# Patient Record
Sex: Female | Born: 1960 | Race: White | Hispanic: No | Marital: Married | Smoking: Former smoker
Health system: Southern US, Community
[De-identification: ages and names within clinical notes are randomized; demographics above are authoritative.]

## PROBLEM LIST (undated history)

## (undated) DIAGNOSIS — E785 Hyperlipidemia, unspecified: Secondary | ICD-10-CM

## (undated) DIAGNOSIS — Q211 Atrial septal defect, unspecified: Secondary | ICD-10-CM

## (undated) DIAGNOSIS — R109 Unspecified abdominal pain: Secondary | ICD-10-CM

## (undated) DIAGNOSIS — I10 Essential (primary) hypertension: Secondary | ICD-10-CM

## (undated) DIAGNOSIS — E559 Vitamin D deficiency, unspecified: Secondary | ICD-10-CM

## (undated) DIAGNOSIS — L659 Nonscarring hair loss, unspecified: Secondary | ICD-10-CM

## (undated) DIAGNOSIS — R0600 Dyspnea, unspecified: Secondary | ICD-10-CM

## (undated) DIAGNOSIS — C4491 Basal cell carcinoma of skin, unspecified: Secondary | ICD-10-CM

## (undated) DIAGNOSIS — N809 Endometriosis, unspecified: Secondary | ICD-10-CM

## (undated) DIAGNOSIS — K589 Irritable bowel syndrome without diarrhea: Secondary | ICD-10-CM

## (undated) DIAGNOSIS — R030 Elevated blood-pressure reading, without diagnosis of hypertension: Secondary | ICD-10-CM

## (undated) DIAGNOSIS — T7840XA Allergy, unspecified, initial encounter: Secondary | ICD-10-CM

## (undated) HISTORY — PX: SKIN CANCER EXCISION: SHX779

## (undated) HISTORY — DX: Endometriosis, unspecified: N80.9

## (undated) HISTORY — DX: Allergy, unspecified, initial encounter: T78.40XA

## (undated) HISTORY — DX: Atrial septal defect: Q21.1

## (undated) HISTORY — DX: Basal cell carcinoma of skin, unspecified: C44.91

## (undated) HISTORY — DX: Nonscarring hair loss, unspecified: L65.9

## (undated) HISTORY — DX: Atrial septal defect, unspecified: Q21.10

## (undated) HISTORY — DX: Vitamin D deficiency, unspecified: E55.9

## (undated) HISTORY — DX: Unspecified abdominal pain: R10.9

## (undated) HISTORY — DX: Dyspnea, unspecified: R06.00

## (undated) HISTORY — DX: Hyperlipidemia, unspecified: E78.5

## (undated) HISTORY — DX: Irritable bowel syndrome, unspecified: K58.9

---
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
## 1988-05-19 HISTORY — PX: LAPAROTOMY: SHX154

## 1988-05-19 HISTORY — PX: CHOLECYSTECTOMY: SHX55

## 1998-05-19 HISTORY — PX: OOPHORECTOMY: SHX86

## 2011-03-26 ENCOUNTER — Other Ambulatory Visit: Payer: Self-pay | Admitting: *Deleted

## 2011-03-26 DIAGNOSIS — R2230 Localized swelling, mass and lump, unspecified upper limb: Secondary | ICD-10-CM

## 2011-04-08 ENCOUNTER — Ambulatory Visit
Admission: RE | Admit: 2011-04-08 | Discharge: 2011-04-08 | Disposition: A | Discharge status: Home / Self Care | Payer: PRIVATE HEALTH INSURANCE | Source: Ambulatory Visit | Attending: *Deleted | Admitting: *Deleted

## 2011-04-08 DIAGNOSIS — R2230 Localized swelling, mass and lump, unspecified upper limb: Secondary | ICD-10-CM

## 2011-11-25 ENCOUNTER — Other Ambulatory Visit: Payer: Self-pay | Admitting: Family Medicine

## 2011-11-25 DIAGNOSIS — R2230 Localized swelling, mass and lump, unspecified upper limb: Secondary | ICD-10-CM

## 2012-02-23 ENCOUNTER — Ambulatory Visit (INDEPENDENT_AMBULATORY_CARE_PROVIDER_SITE_OTHER)
Admission: RE | Admit: 2012-02-23 | Discharge: 2012-02-23 | Disposition: A | Discharge status: Home / Self Care | Payer: PRIVATE HEALTH INSURANCE | Source: Ambulatory Visit | Attending: Internal Medicine | Admitting: Internal Medicine

## 2012-02-23 ENCOUNTER — Ambulatory Visit (INDEPENDENT_AMBULATORY_CARE_PROVIDER_SITE_OTHER): Payer: PRIVATE HEALTH INSURANCE | Admitting: Internal Medicine

## 2012-02-23 ENCOUNTER — Encounter: Payer: Self-pay | Admitting: Internal Medicine

## 2012-02-23 VITALS — BP 112/70 | HR 78 | Temp 98.0°F | Ht 67.0 in | Wt 159.8 lb

## 2012-02-23 DIAGNOSIS — R0609 Other forms of dyspnea: Secondary | ICD-10-CM

## 2012-02-23 DIAGNOSIS — R0600 Dyspnea, unspecified: Secondary | ICD-10-CM | POA: Insufficient documentation

## 2012-02-23 DIAGNOSIS — R0989 Other specified symptoms and signs involving the circulatory and respiratory systems: Secondary | ICD-10-CM

## 2012-02-23 HISTORY — DX: Other forms of dyspnea: R06.09

## 2012-02-23 NOTE — Patient Instructions (Addendum)
Classic subdiaphragmatic pain pattern suggests ibs: very limited distribution of pain locations, daytime, not exacerbated by ex or coughing, worse in sitting position, associated with generalized abd bloating, not present supine due to the dome effect of the diaphragm is  canceled in that position. Frequently these patients have had multiple negative GI workups and CT scans.  Treatment consists of avoiding foods that cause gas (especially beans and raw vegetables like spinach and salads boiled eggs)  and citrucel powder 1 heaping tsp twice daily with a large glass of water.  Pain should improve w/in 2 weeks and if not then consider further GI work up.

## 2012-02-23 NOTE — Progress Notes (Signed)
  Subjective:    Patient ID: Kristin [name], female    DOB: 22-Jul-1960  MRN: [id number]  HPI  51 yo  [place] wf  quit smoking 2006 ?  pna as child but intact  ex tol referred   by UC @ [place] for new cp   02/23/2012 1st pulmonary eval cc 12/2011 acute onset sore throat / hoarseness rx lozenges then went uc got prednisone and abx for dx of bronchitis then onset abd  And flank pain = bilaterally, sitting position only,  p took abx > 2nd abx/prednisone > better but not completely and still sense can't take deep breath, but ok lying down, ok push grand-daughter up and down driveway. Pos exposure to bleaches but minimal cough hx.  No daytime variability, always in sitting position, denies change in bowel habits/ diet/assoc nausea or excess gas or change in pain with flatus or eructation  Sleeping ok without nocturnal  or early am exacerbation  of respiratory  c/o's or need for noct saba. Also denies any obvious fluctuation of symptoms with weather or environmental changes or other aggravating or alleviating factors except as outlined above   Review of Systems  Constitutional: Negative for fever, chills and unexpected weight change.  HENT: Positive for congestion and sneezing. Negative for ear pain, nosebleeds, sore throat, rhinorrhea, trouble swallowing, dental problem, voice change, postnasal drip and sinus pressure.   Eyes: Negative for visual disturbance.  Respiratory: Negative for cough, choking and shortness of breath.   Cardiovascular: Positive for chest pain. Negative for leg swelling.  Gastrointestinal: Positive for abdominal pain. Negative for vomiting and diarrhea.  Genitourinary: Negative for difficulty urinating.  Musculoskeletal: Negative for arthralgias.  Skin: Negative for rash.  Neurological: Negative for tremors, syncope and headaches.  Hematological: Does not bruise/bleed easily.       Objective:   Physical Exam  Animated pleasant amb wf nad Wt 159 02/23/2012 HEENT: nl  dentition, turbinates, and orophanx. Nl external ear canals without cough reflex   NECK :  without JVD/Nodes/TM/ nl carotid upstrokes bilaterally   LUNGS: no acc muscle use, clear to A and P bilaterally without cough on insp or exp maneuvers   CV:  RRR  no s3 or murmur or increase in P2, no edema   ABD:  soft and diffusely tender across upper abd with viz scars over GB with nl excursion in the supine position. No bruits or organomegaly, bowel sounds nl  MS:  warm without deformities, calf tenderness, cyanosis or clubbing  SKIN: warm and dry without lesions    NEURO:  alert, approp, no deficits   CXR  02/23/2012 :  1. Mildly prominent right hilum, probably vascular although adenopathy is difficult to exclude. Assessment is mildly complicated by the leftward rotation on today's exam. 2. Mild left ventricular prominence on the lateral projection, but without overall cardiomegaly observed. 3. Subsegmental atelectasis in the lingula.       Assessment & Plan:

## 2012-02-24 NOTE — Assessment & Plan Note (Addendum)
-   02/23/2012  Walked RA x 3 laps @ 185 ft each stopped due to  End of study, no tachypnea, tachycardia or desat  Symptoms are markedly disproportionate to objective findings and not clear this is a lung problem but could well be due to a form of ibs with sym abd pain in sitting position only, no pain with cough, not present sleeping and no symptoms with exertion  See instructions for specific recommendations which were reviewed directly with the patient who was given a copy with highlighter outlining the key components. Will try rx with citrucel first then ct chest/abd next step if not improving in 2 weeks

## 2012-04-01 ENCOUNTER — Telehealth: Payer: Self-pay | Admitting: Internal Medicine

## 2012-04-01 DIAGNOSIS — R109 Unspecified abdominal pain: Secondary | ICD-10-CM

## 2012-04-01 NOTE — Telephone Encounter (Signed)
Spoke with pt's spouse He states that her abd pain is not improving with citrucel and wants referral to GI Are you okay with referring or should her PCP do this? Please advise thanks

## 2012-04-01 NOTE — Telephone Encounter (Signed)
Referral was made Spoke with pt's spouse and notified that this was done He verbalized understanding and states nothing further needed.

## 2012-04-01 NOTE — Telephone Encounter (Signed)
Sure - fine with me

## 2012-04-13 ENCOUNTER — Encounter: Payer: Self-pay | Admitting: *Deleted

## 2012-04-22 ENCOUNTER — Ambulatory Visit (INDEPENDENT_AMBULATORY_CARE_PROVIDER_SITE_OTHER): Payer: PRIVATE HEALTH INSURANCE | Admitting: Gastroenterology

## 2012-04-22 ENCOUNTER — Other Ambulatory Visit (INDEPENDENT_AMBULATORY_CARE_PROVIDER_SITE_OTHER): Payer: No Typology Code available for payment source

## 2012-04-22 ENCOUNTER — Encounter: Payer: Self-pay | Admitting: Gastroenterology

## 2012-04-22 VITALS — BP 130/80 | HR 58 | Ht 67.0 in | Wt 158.8 lb

## 2012-04-22 DIAGNOSIS — Z9089 Acquired absence of other organs: Secondary | ICD-10-CM

## 2012-04-22 DIAGNOSIS — R1010 Upper abdominal pain, unspecified: Secondary | ICD-10-CM

## 2012-04-22 DIAGNOSIS — Z9049 Acquired absence of other specified parts of digestive tract: Secondary | ICD-10-CM

## 2012-04-22 DIAGNOSIS — R109 Unspecified abdominal pain: Secondary | ICD-10-CM

## 2012-04-22 LAB — HEPATIC FUNCTION PANEL
ALT: 26 U/L (ref 0–35)
AST: 28 U/L (ref 0–37)
Albumin: 4.4 g/dL (ref 3.5–5.2)
Alkaline Phosphatase: 56 U/L (ref 39–117)
Bilirubin, Direct: 0.1 mg/dL (ref 0.0–0.3)
Total Bilirubin: 0.5 mg/dL (ref 0.3–1.2)
Total Protein: 7.7 g/dL (ref 6.0–8.3)

## 2012-04-22 LAB — FERRITIN: Ferritin: 40.7 ng/mL (ref 10.0–291.0)

## 2012-04-22 LAB — VITAMIN B12: Vitamin B-12: 616 pg/mL (ref 211–911)

## 2012-04-22 LAB — LIPASE: Lipase: 28 U/L (ref 11.0–59.0)

## 2012-04-22 LAB — AMYLASE: Amylase: 66 U/L (ref 27–131)

## 2012-04-22 LAB — SEDIMENTATION RATE: Sed Rate: 9 mm/hr (ref 0–22)

## 2012-04-22 LAB — IBC PANEL
Iron: 85 ug/dL (ref 42–145)
Saturation Ratios: 22.3 % (ref 20.0–50.0)
Transferrin: 272.3 mg/dL (ref 212.0–360.0)

## 2012-04-22 LAB — FOLATE: Folate: 22.2 ng/mL (ref >5.9)

## 2012-04-22 MED ORDER — CILIDINIUM-CHLORDIAZEPOXIDE 2.5-5 MG PO CAPS: 1.0000 | ORAL_CAPSULE | Freq: Three times a day (TID) | ORAL | Status: DC | PRN

## 2012-04-22 NOTE — Patient Instructions (Addendum)
You have been scheduled for an endoscopy with propofol. Please follow written instructions given to you at your visit today. If you use inhalers (even only as needed) or a CPAP machine, please bring them with you on the day of your procedure.  Your physician has requested that you go to the basement for lab work before leaving today.  You have been scheduled for an abdominal ultrasound at [facility] (1st floor of hospital) on 04-26-2012 at 9 AM. Please arrive 15 minutes prior to your appointment for registration. Make certain not to have anything to eat or drink 6 hours prior to your appointment. Should you need to reschedule your appointment, please contact radiology at [phone]. This test typically takes about 30 minutes to perform.  We have sent the following medications to your pharmacy for you to pick up at your convenience: Librax. Please take as directed.

## 2012-04-22 NOTE — Progress Notes (Addendum)
History of Present Illness:  This is a 51 year old Polish-American female patient of Magda Bernheim and Dr. Jeani Hawking in gastroenterology.She recently saw Dr. Sandrea Hughs in the pulmonary division, and had a negative pulmonary evaluation , and was referred to gastroenterology for evaluation.  This patient has had over a year of pain in her subcostal areas bilaterally with initial CT scan suggesting dilated biliary structures, but MRCP was unremarkable.  She saw Dr. Elnoria Howard and had a negative colonoscopy in January 2013.  We have no records of any followup from these physicians.  Her pain is in her subcostal areas bilaterally, is constant, is made best by lying still, his not related to eating or bowel movements, and there is no melena, hematochezia, nausea vomiting, dyspepsia, or any specific hepatobiliary complaints.  She's had no anorexia, weight loss, fever or chills.  She denies abuse of alcohol, cigarettes, or NSAIDs.  On review of her records, I cannot see any laboratory parameters.  Is not on any medications except for when necessary Tylenol and when necessary Advil.  Apparently in 1989 this patient had cholecystectomy with subsequent postoperative complications ,and had repeat extensive biliary and abdominal surgery.  This was performed in: [place] and thus no records are available for review.  She also has had previous cesarean sections.  She denies specific gynecologic problems.  There is no history of hepatitis, pancreatitis, or alcohol abuse.  I have reviewed this patient's present history, medical and surgical past history, allergies and medications.     ROS: The remainder of the 10 point ROS is negative     Physical Exam: Blood pressure 130/80, pulse 50 and regular, and weight under and 58 pounds with a BMI of 24.87. General well developed well nourished patient in no acute distress, appearing their stated age Eyes PERRLA, no icterus, fundoscopic exam per opthamologist Skin no  lesions noted Neck supple, no adenopathy, no thyroid enlargement, no tenderness,, mild thyromegaly noted. Chest clear to percussion and auscultation Heart no significant murmurs, gallops or rubs noted Abdomen no hepatosplenomegaly masses or tenderness, BS normal.  Rectal inspection normal no fissures, or fistulae noted.  No masses or tenderness on digital exam. Stool guaiac negative. Extremities no acute joint lesions, edema, phlebitis or evidence of cellulitis. Neurologic patient oriented x 3, cranial nerves intact, no focal neurologic deficits noted. Psychological mental status normal and normal affect.  Assessment and plan: She has"gas trapping" type pain in her subcostal areas bilaterally without any other GI, genitourinary, gynecologic, pulmonary or cardiac symptoms.  I suspect she has some nonspecific intestinal adhesions related to her previous complicated biliary surgery.  I will check her CBC, liver profile, amylase, lipase, upper abdominal ultrasound exam and endoscopy to be complete.  In the interim I placed her on Librax one by mouth 3 times a day before meals.  I doubt we will uncover definite etiology to her pain, and she certainly appears very healthy and in no apparent discomfort.  We also request records from her previous gastroenterologist.  No diagnosis found.    Please copy her primary care physician, referring physician, and pertinent subspecialists.

## 2012-04-23 ENCOUNTER — Encounter: Payer: Self-pay | Admitting: Gastroenterology

## 2012-04-23 ENCOUNTER — Ambulatory Visit (AMBULATORY_SURGERY_CENTER): Payer: PRIVATE HEALTH INSURANCE | Admitting: Gastroenterology

## 2012-04-23 VITALS — BP 126/82 | HR 63 | Temp 98.2°F | Resp 21 | Ht 67.0 in | Wt 158.0 lb

## 2012-04-23 DIAGNOSIS — R109 Unspecified abdominal pain: Secondary | ICD-10-CM

## 2012-04-23 LAB — CELIAC PANEL 10
Endomysial Screen: NEGATIVE
Gliadin IgA: 8.2 U/mL (ref <20)
Gliadin IgG: 4.4 U/mL (ref <20)
IgA: 213 mg/dL (ref 69–380)
Tissue Transglut Ab: 8.5 U/mL (ref <20)
Tissue Transglutaminase Ab, IgA: 4.6 U/mL (ref <20)

## 2012-04-23 MED ORDER — SODIUM CHLORIDE 0.9 % IV SOLN: 500.0000 mL | INTRAVENOUS | Status: DC

## 2012-04-23 NOTE — Op Note (Signed)
[facility] [address] [place], [address]   ENDOSCOPY PROCEDURE REPORT  PATIENT: Kristin [name], Kristin [name]  MR#: [id number] BIRTHDATE: [birth year] , 51  yrs. old GENDER: Female ENDOSCOPIST:David Hale Bogus, MD, [facility] REFERRED BY: PROCEDURE DATE:  04/23/2012 PROCEDURE:   EGD w/ biopsy for H.pylori ASA CLASS:    Class II INDICATIONS: abdominal pain. MEDICATION: propofol (Diprivan) 150mg  IV and Robinul 0.2 mg IV TOPICAL ANESTHETIC:   Cetacaine Spray  DESCRIPTION OF PROCEDURE:   After the risks and benefits of the procedure were explained, informed consent was obtained.  The LB GIF-H180 [id number]  endoscope was introduced through the mouth  and advanced to the second portion of the duodenum .  The instrument was slowly withdrawn as the mucosa was fully examined.    The upper, middle and distal third of the esophagus were carefully inspected and no abnormalities were noted.  The z-line was well seen at the GEJ.  The endoscope was pushed into the fundus which was normal including a retroflexed view.  The antrum, gastric body, first and second part of the duodenum were unremarkable. Retroflexed views revealed no abnormalities.    The scope was then withdrawn from the patient and the procedure completed. CLO biopsy done....  COMPLICATIONS: There were no complications.   ENDOSCOPIC IMPRESSION: Normal EGD .Marland Kitchen.probable IBS...  RECOMMENDATIONS: 1.  Await pathology results 2.  Continue current medications 3. Ultrasound pending   _______________________________ eSigned:  Mardella Layman, MD, [facility] 04/23/2012 2:40 PM   standard discharge   PATIENT NAME:  Kristin [name], Kristin [name] MR#: [id number]

## 2012-04-23 NOTE — Progress Notes (Deleted)
Patient did not have preoperative order for IV antibiotic SSI prophylaxis. (G8918)   

## 2012-04-23 NOTE — Patient Instructions (Addendum)
YOU HAD AN ENDOSCOPIC PROCEDURE TODAY AT THE [facility]: Refer to the procedure report that was given to you for any specific questions about what was found during the examination.  If the procedure report does not answer your questions, please call your gastroenterologist to clarify.  If you requested that your care partner not be given the details of your procedure findings, then the procedure report has been included in a sealed envelope for you to review at your convenience later.  YOU SHOULD EXPECT: Some feelings of bloating in the abdomen. Passage of more gas than usual.  Walking can help get rid of the air that was put into your GI tract during the procedure and reduce the bloating. If you had a lower endoscopy (such as a colonoscopy or flexible sigmoidoscopy) you may notice spotting of blood in your stool or on the toilet paper. If you underwent a bowel prep for your procedure, then you may not have a normal bowel movement for a few days.  DIET: Your first meal following the procedure should be a light meal and then it is ok to progress to your normal diet.  A half-sandwich or bowl of soup is an example of a good first meal.  Heavy or fried foods are harder to digest and may make you feel nauseous or bloated.  Likewise meals heavy in dairy and vegetables can cause extra gas to form and this can also increase the bloating.  Drink plenty of fluids but you should avoid alcoholic beverages for 24 hours.  ACTIVITY: Your care partner should take you home directly after the procedure.  You should plan to take it easy, moving slowly for the rest of the day.  You can resume normal activity the day after the procedure however you should NOT DRIVE or use heavy machinery for 24 hours (because of the sedation medicines used during the test).    SYMPTOMS TO REPORT IMMEDIATELY: A gastroenterologist can be reached at any hour.  During normal business hours, 8:30 AM to 5:00 PM Monday through Friday,  call [phone].  After hours and on weekends, please call the GI answering service at [phone] who will take a message and have the physician on call contact you.   Following upper endoscopy (EGD)  Vomiting of blood or coffee ground material  New chest pain or pain under the shoulder blades  Painful or persistently difficult swallowing  New shortness of breath  Fever of 100F or higher  Black, tarry-looking stools  FOLLOW UP: If any biopsies were taken you will be contacted by phone or by letter within the next 1-3 weeks.  Call your gastroenterologist if you have not heard about the biopsies in 3 weeks.  Our staff will call the home number listed on your records the next business day following your procedure to check on you and address any questions or concerns that you may have at that time regarding the information given to you following your procedure. This is a courtesy call and so if there is no answer at the home number and we have not heard from you through the emergency physician on call, we will assume that you have returned to your regular daily activities without incident.  Thank-you for choosing us for your healthcare needs.  SIGNATURES/CONFIDENTIALITY: You and/or your care partner have signed paperwork which will be entered into your electronic medical record.  These signatures attest to the fact that that the information above on your After Visit Summary   has been reviewed and is understood.  Full responsibility of the confidentiality of this discharge information lies with you and/or your care-partner.  

## 2012-04-23 NOTE — Progress Notes (Signed)
Patient did not experience any of the following events: a burn prior to discharge; a fall within the facility; wrong site/side/patient/procedure/implant event; or a hospital transfer or hospital admission upon discharge from the facility. (G8907) Patient did not have preoperative order for IV antibiotic SSI prophylaxis. (G8918)  

## 2012-04-26 ENCOUNTER — Telehealth: Payer: Self-pay | Admitting: *Deleted

## 2012-04-26 ENCOUNTER — Other Ambulatory Visit: Payer: Self-pay | Admitting: *Deleted

## 2012-04-26 ENCOUNTER — Ambulatory Visit (HOSPITAL_COMMUNITY)
Admission: RE | Admit: 2012-04-26 | Discharge: 2012-04-26 | Disposition: A | Discharge status: Home / Self Care | Payer: No Typology Code available for payment source | Source: Ambulatory Visit | Attending: Gastroenterology | Admitting: Gastroenterology

## 2012-04-26 DIAGNOSIS — R1010 Upper abdominal pain, unspecified: Secondary | ICD-10-CM

## 2012-04-26 DIAGNOSIS — R109 Unspecified abdominal pain: Secondary | ICD-10-CM | POA: Insufficient documentation

## 2012-04-26 MED ORDER — HYOSCYAMINE SULFATE 0.125 MG SL SUBL: 0.1250 mg | SUBLINGUAL_TABLET | SUBLINGUAL | Status: DC | PRN

## 2012-04-26 NOTE — Telephone Encounter (Signed)
  Follow up Call-  Call back number 04/23/2012  Post procedure Call Back phone  # [phone] or [phone]  Permission to leave phone message Yes     Patient questions:  Do you have a fever, pain , or abdominal swelling? no Pain Score  0 *  Have you tolerated food without any problems? yes  Have you been able to return to your normal activities? yes  Do you have any questions about your discharge instructions: Diet   no Medications  no Follow up visit  no  Do you have questions or concerns about your Care? no  Actions: * If pain score is 4 or above: No action needed, pain <4.

## 2012-04-27 ENCOUNTER — Other Ambulatory Visit: Payer: Self-pay | Admitting: *Deleted

## 2012-04-27 DIAGNOSIS — R1013 Epigastric pain: Secondary | ICD-10-CM

## 2012-04-28 LAB — HELICOBACTER PYLORI SCREEN-BIOPSY: UREASE: NEGATIVE

## 2012-04-28 NOTE — Addendum Note (Signed)
Addended by: Lorie Phenix on: 04/28/2012 09:25 AM   Modules accepted: Orders

## 2012-07-30 ENCOUNTER — Other Ambulatory Visit: Payer: Self-pay | Admitting: Gastroenterology

## 2013-03-21 DIAGNOSIS — B351 Tinea unguium: Secondary | ICD-10-CM

## 2013-03-30 ENCOUNTER — Other Ambulatory Visit: Payer: Self-pay | Admitting: Gastroenterology

## 2013-03-30 NOTE — Telephone Encounter (Signed)
PLEASE MAKE AN OFFICE VISIT FOR FURTHER REFILLS  

## 2013-04-22 ENCOUNTER — Ambulatory Visit: Payer: Self-pay

## 2013-05-06 ENCOUNTER — Ambulatory Visit (INDEPENDENT_AMBULATORY_CARE_PROVIDER_SITE_OTHER): Payer: PRIVATE HEALTH INSURANCE

## 2013-05-06 VITALS — BP 138/87 | HR 77 | Resp 18

## 2013-05-06 DIAGNOSIS — B351 Tinea unguium: Secondary | ICD-10-CM

## 2013-05-06 NOTE — Patient Instructions (Signed)

## 2013-05-06 NOTE — Progress Notes (Signed)
   Subjective:    Patient ID: Kristin [name], female    DOB: 01-Feb-1961, 52 y.o.   MRN: [id number]  HPI My nails maybe a little better but i really cant tell if they are and I have been using the polish like he told me    Review of Systems no changes     Objective:   Physical Exam Neurovascular status is intact pedal pulses palpable epicritic and proprioceptive sensations intact to the hallux nails bilateral show some yellowing and thickening distally to the proximal one third showing some slight clearing and a pink base to the nailbed should the patient could not take the Sporanox due to allergic reaction has severe rash over her body had to stop and therefore strictly using the topical antifungal formula 3 at this time. Patient given options of small alternatives the Jublia however her insurance is not likely cover an extensive medications. Therefore we'll stay with the OTC formula 3       Assessment & Plan:  Assessment onychomycosis with slow improvement both hallux nails was left slightly worse than right. Continue with topical formula 3 application twice daily followup in 6 months consider the possibility of the laser treatment if there is no improvement  Alvan Dame DPM

## 2013-05-19 HISTORY — PX: HYSTEROSCOPY: SHX211

## 2013-08-27 ENCOUNTER — Emergency Department (HOSPITAL_COMMUNITY)
Admission: EM | Admit: 2013-08-27 | Discharge: 2013-08-27 | Disposition: A | Discharge status: Home / Self Care | Payer: 59 | Attending: Emergency Medicine | Admitting: Emergency Medicine

## 2013-08-27 ENCOUNTER — Encounter (HOSPITAL_COMMUNITY): Payer: Self-pay | Admitting: Emergency Medicine

## 2013-08-27 DIAGNOSIS — R109 Unspecified abdominal pain: Secondary | ICD-10-CM

## 2013-08-27 DIAGNOSIS — Z791 Long term (current) use of non-steroidal anti-inflammatories (NSAID): Secondary | ICD-10-CM | POA: Insufficient documentation

## 2013-08-27 DIAGNOSIS — Z87891 Personal history of nicotine dependence: Secondary | ICD-10-CM | POA: Insufficient documentation

## 2013-08-27 DIAGNOSIS — R1031 Right lower quadrant pain: Secondary | ICD-10-CM | POA: Insufficient documentation

## 2013-08-27 LAB — URINALYSIS, ROUTINE W REFLEX MICROSCOPIC
Bilirubin Urine: NEGATIVE
Glucose, UA: NEGATIVE mg/dL
Hgb urine dipstick: NEGATIVE
Ketones, ur: NEGATIVE mg/dL
Leukocytes, UA: NEGATIVE
Nitrite: NEGATIVE
Protein, ur: NEGATIVE mg/dL
Specific Gravity, Urine: 1.029 (ref 1.005–1.030)
Urobilinogen, UA: 0.2 mg/dL (ref 0.0–1.0)
pH: 6 (ref 5.0–8.0)

## 2013-08-27 MED ORDER — HYDROCODONE-ACETAMINOPHEN 5-325 MG PO TABS: 1.0000 | ORAL_TABLET | ORAL | Status: DC | PRN

## 2013-08-27 NOTE — ED Provider Notes (Signed)
CSN: [id number]     Arrival date & time 08/27/13  1246 History   First MD Initiated Contact with Patient 08/27/13 1255     Chief Complaint  Patient presents with  . Abdominal Pain    lower     (Consider location/radiation/quality/duration/timing/severity/associated sxs/prior Treatment) HPI Comments: Patient is a 53 year old female with past medical history of cholecystectomy, hysterectomy. She presents today with complaints of suprapubic and right lower quadrant abdominal pain she states is been present for 4 weeks. She denies any constipation, diarrhea, bloody stool, urinary complaints. Her symptoms have been constant and she has been evaluated by her primary doctor, urgent care, and her gynecologist. None have found an explanation. She had a CT performed yesterday which was unremarkable. She has the report with her at bedside.  Patient is a 53 y.o. female presenting with abdominal pain. The history is provided by the patient.  Abdominal Pain Pain location:  RLQ and suprapubic Pain quality: cramping   Pain radiates to:  Does not radiate Pain severity:  Moderate Onset quality:  Gradual Duration:  4 weeks Timing:  Constant Progression:  Worsening Chronicity:  New Relieved by:  Nothing Worsened by:  Nothing tried   Past Medical History  Diagnosis Date  . Dyspnea    Past Surgical History  Procedure Laterality Date  . Cholecystectomy      completed in [place]  . Partial hysterectomy      completed in [place]   Family History  Problem Relation Age of Onset  . Heart disease Mother   . Colon cancer Neg Hx   . Liver disease Father    History  Substance Use Topics  . Smoking status: Former Smoker -- 1.00 packs/day for 10 years    Types: Cigarettes    Quit date: 05/19/2004  . Smokeless tobacco: Never Used  . Alcohol Use: No   OB History   Grav Para Term Preterm Abortions TAB SAB Ect Mult Living                 Review of Systems  Gastrointestinal: Positive for abdominal  pain.  All other systems reviewed and are negative.     Allergies  Diclofenac sodium and Sporanox  Home Medications   Current Outpatient Rx  Name  Route  Sig  Dispense  Refill  . clidinium-chlordiazePOXIDE (LIBRAX) 5-2.5 MG per capsule      take 1 capsule by mouth three times a day if needed   60 capsule   0   . hyoscyamine (LEVSIN SL) 0.125 MG SL tablet      PLACE 1 TABLET UNDER TONGUE EVERY 4 HOURS IF NEEDED FOR CRAMPING   30 tablet   1   . naproxen (NAPROSYN) 500 MG tablet   Oral   Take 500 mg by mouth 2 (two) times daily with a meal.          BP 134/86  Pulse 96  Temp(Src) 98.2 F (36.8 C) (Oral)  Resp 22  Ht 5\' 5"  (1.651 m)  Wt 160 lb (72.576 kg)  BMI 26.63 kg/m2  SpO2 97%  LMP 12/28/2010 Physical Exam  Nursing note and vitals reviewed. Constitutional: She is oriented to person, place, and time. She appears well-developed and well-nourished. No distress.  HENT:  Head: Normocephalic and atraumatic.  Neck: Normal range of motion. Neck supple.  Cardiovascular: Normal rate and regular rhythm.  Exam reveals no gallop and no friction rub.   No murmur heard. Pulmonary/Chest: Effort normal and breath sounds normal. No  respiratory distress. She has no wheezes.  Abdominal: Soft. Bowel sounds are normal. She exhibits no distension and no mass. There is tenderness. There is no rebound and no guarding.  There is tenderness to palpation in the suprapubic region and right lower quadrant. There is no rebound and no guarding. There are 2 healed surgical scars present, one in the right upper quadrant and one in the midline just below the umbilicus. There are no palpable hernias and these do not appear to be giving her any discomfort.  Musculoskeletal: Normal range of motion.  Neurological: She is alert and oriented to person, place, and time.  Skin: Skin is warm and dry. She is not diaphoretic.    ED Course  Procedures (including critical care time) Labs Review Labs  Reviewed  URINALYSIS, ROUTINE W REFLEX MICROSCOPIC   Imaging Review No results found.   EKG Interpretation None      MDM   Final diagnoses:  None    Workup reveals a normal urinalysis and CT scan performed yesterday reveals no acute intra-abdominal abnormality. Her history, exam, and CT findings are inconsistent with appendicitis and there is no evidence for urinary tract infection. At this point I do not feel as though any further emergent workup is indicated. I do feel as though she warrants followup with a gastroenterologist and will provide her with the contact information for the on-call GI physician. She understands to return if her symptoms worsen, she develops bloody stool, or other new and concerning symptoms.    Veryl Speak, MD 08/27/13 [phone]

## 2013-08-27 NOTE — ED Notes (Addendum)
Pt c/o lower abdominal pain months ago. Pt seen at urgent care 4 weeks ago and seen by gynecology. Pt had a CT scan yesterday family has the results and disk with them. Pt reports increase in urination and feeling pressure.

## 2013-08-27 NOTE — ED Notes (Signed)
Onset 5 or 6 weeks lower mid abd pain.  Feels like pressure.  Seen at urgent care 4 weeks ago, was referred to GYN, GYN didn't find anything.  Had CT scan yesterday.  Pain worsened overnight and urgent care referred pt to ED.  Onset 1 week urinary frequency.  No dysuria.  No other s/s noted. Takes Tramadol and Naproxen without relief.  Nothing makes better or worse.

## 2013-08-27 NOTE — Discharge Instructions (Signed)
Hydrocodone as needed for pain.  Followup with gastroenterology. The contact information for Dr. Benson Norway and Dr. Collene Mares have been provided on this discharge summary.  Return to the ER if you develop worsening pain, high fever, bloody stool, or other new and concerning symptoms.   Abdominal Pain, Adult Many things can cause abdominal pain. Usually, abdominal pain is not caused by a disease and will improve without treatment. It can often be observed and treated at home. Your health care provider will do a physical exam and possibly order blood tests and X-rays to help determine the seriousness of your pain. However, in many cases, more time must pass before a clear cause of the pain can be found. Before that point, your health care provider may not know if you need more testing or further treatment. HOME CARE INSTRUCTIONS  Monitor your abdominal pain for any changes. The following actions may help to alleviate any discomfort you are experiencing:  Only take over-the-counter or prescription medicines as directed by your health care provider.  Do not take laxatives unless directed to do so by your health care provider.  Try a clear liquid diet (broth, tea, or water) as directed by your health care provider. Slowly move to a bland diet as tolerated. SEEK MEDICAL CARE IF:  You have unexplained abdominal pain.  You have abdominal pain associated with nausea or diarrhea.  You have pain when you urinate or have a bowel movement.  You experience abdominal pain that wakes you in the night.  You have abdominal pain that is worsened or improved by eating food.  You have abdominal pain that is worsened with eating fatty foods. SEEK IMMEDIATE MEDICAL CARE IF:   Your pain does not go away within 2 hours.  You have a fever.  You keep throwing up (vomiting).  Your pain is felt only in portions of the abdomen, such as the right side or the left lower portion of the abdomen.  You pass bloody or black  tarry stools. MAKE SURE YOU:  Understand these instructions.   Will watch your condition.   Will get help right away if you are not doing well or get worse.  Document Released: 02/12/2005 Document Revised: 02/23/2013 Document Reviewed: 01/12/2013 [facility] Patient Information 2014 Kristin [name].

## 2013-11-04 ENCOUNTER — Ambulatory Visit (INDEPENDENT_AMBULATORY_CARE_PROVIDER_SITE_OTHER): Payer: 59

## 2013-11-04 VITALS — BP 123/84 | HR 85 | Resp 16

## 2013-11-04 DIAGNOSIS — B351 Tinea unguium: Secondary | ICD-10-CM

## 2013-11-04 MED ORDER — TAVABOROLE 5 % EX SOLN: CUTANEOUS | Status: DC

## 2013-11-04 NOTE — Progress Notes (Signed)
   Subjective:    Patient ID: Kristin [name], female    DOB: [birth year], 53 y.o.   MRN: [id number]  HPI Comments: "Not really much change"  Follow up nail fungus      Review of Systems no new findings or systemic changes noted     Objective:   Physical Exam Neurovascular status is intact pedal pulses palpable patient continues to have dystrophy and discoloration the right hallux nails improve with topical antifungal formula 3 over the left hallux second third digits still show some distal discoloration proptosis and dystrophy patient was prescribed as prior to the new medications that were introduced on the market at this time that option was given to switch to a new or or prone type of topical antifungal medication and patient elects to do so at this time. Did review her previous pathology cultures which were positive for separate the dermal and nail dystrophy due to trauma. Otherwise examined with no new changes noted no difficulties no secondary infections no open wounds or ulcers.       Assessment & Plan:  Assessment onychomycosis distress dystrophy of nails hallux second and third left right hallux has some remaining fungus and possibly fifth digits bilaterally show some dystrophy. Plan at this time switch to topical Kerydin. Prescription for did to the underlying pharmacy [facility]. Patient will initiate topical Kerydin application once daily to each affected toenail for 12 months duration recheck within 6-9 months for nail check  Harriet Masson DPM

## 2013-11-04 NOTE — Patient Instructions (Signed)

## 2014-01-10 ENCOUNTER — Telehealth: Payer: Self-pay | Admitting: *Deleted

## 2014-01-10 NOTE — Telephone Encounter (Signed)
Obtain appropriate prior authorization is needed for the keratin. However if cannot obtain authorization can use alternate topical such as Fungi-Nail OTC or a prescription for Penlac can be sent to the patient.  Harriet Masson DPM

## 2014-01-10 NOTE — Telephone Encounter (Signed)
Kristin [name] needs prior authorization or if the doctor is interested in prescribing something else give [place] a call.

## 2014-01-18 NOTE — Telephone Encounter (Signed)
I called and left her a message that her insurance will not cover the [place], didn't meet the criteria.  I told her she can purchase some Fungi-nail over the counter or we can send in a prescription for Penlac to her pharmacy.  If you want the prescription call and let us know.

## 2014-01-19 ENCOUNTER — Ambulatory Visit: Payer: 59 | Admitting: Cardiovascular Disease

## 2014-02-01 ENCOUNTER — Ambulatory Visit (INDEPENDENT_AMBULATORY_CARE_PROVIDER_SITE_OTHER): Payer: 59

## 2014-02-01 VITALS — BP 166/93 | HR 59 | Resp 13 | Ht 65.0 in | Wt 159.0 lb

## 2014-02-01 DIAGNOSIS — B351 Tinea unguium: Secondary | ICD-10-CM

## 2014-02-01 MED ORDER — CICLOPIROX 8 % EX SOLN: Freq: Every day | CUTANEOUS | Status: DC

## 2014-02-01 NOTE — Addendum Note (Signed)
Addended by: Roney Jaffe on: 02/01/2014 05:41 PM   Modules accepted: Orders

## 2014-02-01 NOTE — Patient Instructions (Signed)

## 2014-02-01 NOTE — Progress Notes (Signed)
   Subjective:    Patient ID: Kristin [name], female    DOB: Apr 29, 1961, 53 y.o.   MRN: [id number]  HPI Comments: Pt states she treats all 10 toenails with Formula 3, but the Kerydin prescription was denied by her insurance.  Pt wishes to have a different prescription.     Review of Systems no new findings or systemic changes noted    Objective:   Physical Exam Neurovascular status is intact pedal pulses are palpable patient she had 2 rounds the keratin of her than her insurance at her c [employer] water pick for the keratin nylon her formulary, she has diabetes or other complications and has failed other alternatives should note patient has been on mother medications she cannot take oral medications do a breakout taking Lamisil. Patient also had issues with itraconazole orally. Patient has been on topical formula 3 OTC topical antifungal for more than 12 months with no improvement patient is also failed other conservative care and management. However her insurance at this time is not going to cover the new Kerydin medication they will however cover topical Penlac or generic for Penlac       Assessment & Plan:  Assessment onychomycosis dystrophy and discoloration of nails multiple nails both feet at this time patient is given a prescription for topical Penlac to be applied daily for 12 months duration as instructed. Contact us in changes or exacerbations or side effects. Suggest a 12 month long-term followup next  Harriet Masson DPM

## 2014-02-01 NOTE — Addendum Note (Signed)
Addended by: Roney Jaffe on: 02/01/2014 04:52 PM   Modules accepted: Orders

## 2014-04-04 ENCOUNTER — Ambulatory Visit (INDEPENDENT_AMBULATORY_CARE_PROVIDER_SITE_OTHER): Payer: 59

## 2014-04-04 VITALS — BP 128/73 | HR 65 | Resp 18

## 2014-04-04 DIAGNOSIS — B351 Tinea unguium: Secondary | ICD-10-CM

## 2014-04-04 DIAGNOSIS — M79675 Pain in left toe(s): Secondary | ICD-10-CM

## 2014-04-04 MED ORDER — TAVABOROLE 5 % EX SOLN: CUTANEOUS | Status: DC

## 2014-04-04 NOTE — Progress Notes (Signed)
   Subjective:    Patient ID: Kristin [name], female    DOB: 1960/09/07, 53 y.o.   MRN: [id number]  HPI MY TOENAILS ON THE RIGHT FOOT ARE DOING BETTER BUT THE LEFT FOOT NOT SO MUCH AND I HAVE HAD SOME SHARP PAINS IN MY LEFT BIG TOE AND THE INSURANCE SAYS IT IS NOT PAYING FOR THE [place]    Review of Systemsno new findings or systemic changes noted     Objective:   Physical Exam Patient has been applying the keratin antifungal as instructed for about 5 months altogether development charging or tendon on for the medication she is showing some improvement however may be over grossly cutting her nails cutting her nails down to a point of bleeding particular the left hallux nail and the right hallux nail also shows some proptosis incurvation may be candidate for partial nail excision at some point in the future however at this time advised to continue for at least another 5 or 6 months with a topical antifungal keratin. Neurovascular status unchanged pedal pulses are palpable epicritic and proprioceptive sensations intact and symmetric. No other new findings are noted patient previously been on a variety of treatments including formula 3 could not take the oral medications due rash or break out of skin. At this time we'll continue with topical Kerydin as instructed       Assessment & Plan:  Assessment onychomycosis and dystrophy of nail showing some improvement although proptosis of nails present there somewhat tender due to the thickness brittleness and criptotic's is advised to continue with topical Kerydin cushion issued at this time to [facility] will follow-up in 6 months for reevaluation and assessment long-term follow-up  Harriet Masson DPM

## 2014-04-04 NOTE — Patient Instructions (Signed)

## 2014-05-19 HISTORY — PX: CYSTOURETHROSCOPY: SHX476

## 2014-05-29 DIAGNOSIS — B351 Tinea unguium: Secondary | ICD-10-CM

## 2014-07-24 DIAGNOSIS — R350 Frequency of micturition: Secondary | ICD-10-CM | POA: Insufficient documentation

## 2014-07-24 HISTORY — DX: Frequency of micturition: R35.0

## 2014-08-28 ENCOUNTER — Ambulatory Visit (INDEPENDENT_AMBULATORY_CARE_PROVIDER_SITE_OTHER): Payer: BLUE CROSS/BLUE SHIELD | Admitting: Podiatry

## 2014-08-28 ENCOUNTER — Encounter: Payer: Self-pay | Admitting: Podiatry

## 2014-08-28 VITALS — BP 160/90 | HR 72 | Resp 12

## 2014-08-28 DIAGNOSIS — B351 Tinea unguium: Secondary | ICD-10-CM | POA: Diagnosis not present

## 2014-08-28 MED ORDER — TERBINAFINE HCL 250 MG PO TABS: 250.0000 mg | ORAL_TABLET | Freq: Every day | ORAL | Status: DC

## 2014-08-28 NOTE — Progress Notes (Signed)
   Subjective:    Patient ID: Kristin [name], female    DOB: Nov 06, 1960, 54 y.o.   MRN: [id number]  HPI Comments: Pt states Kerydin, Formula 3 and Penlac have not worked on the toenails.     Review of Systems     Objective:   Physical Exam        Assessment & Plan:

## 2014-08-29 LAB — HEPATIC FUNCTION PANEL
ALT: 20 U/L (ref 0–35)
AST: 22 U/L (ref 0–37)
Albumin: 4.5 g/dL (ref 3.5–5.2)
Alkaline Phosphatase: 51 U/L (ref 39–117)
Bilirubin, Direct: 0.1 mg/dL (ref 0.0–0.3)
Indirect Bilirubin: 0.2 mg/dL (ref 0.2–1.2)
Total Bilirubin: 0.3 mg/dL (ref 0.2–1.2)
Total Protein: 6.7 g/dL (ref 6.0–8.3)

## 2014-08-29 NOTE — Progress Notes (Signed)
Subjective:     Patient ID: Kristin [name], female   DOB: May 21, 1960, 54 y.o.   MRN: [id number]  HPI patient has been utilizing topical antifungals for 2 years and still has nail disease that she's concerned about   Review of Systems     Objective:   Physical Exam Her vascular status intact muscle strength adequate with thick yellow brittle nailbeds 1-5 both feet that can itch at times and has not responded topical    Assessment:     Mycotic nail infection 1-5 both feet with failure to respond to topical    Plan:     Reviewed problem and discussed treatment options. At this point due to the systemic element and the fact all nails are involved I do think her best choice is oral medication and I reviewed Lamisil and risk. She's had trouble with another antifungal creating allergy with itching but I do think she'll be able to tolerate Lamisil and I explained the risk of doing this. She wants to try it and she we will start her on Lamisil 250 mg 1 by mouth for 90 days. We also are going to get liver function studies done and I explained is no guarantee this will get her nails better but is the best chance we have

## 2015-01-24 ENCOUNTER — Ambulatory Visit (INDEPENDENT_AMBULATORY_CARE_PROVIDER_SITE_OTHER): Payer: BLUE CROSS/BLUE SHIELD | Admitting: Podiatry

## 2015-01-24 ENCOUNTER — Ambulatory Visit (INDEPENDENT_AMBULATORY_CARE_PROVIDER_SITE_OTHER): Payer: BLUE CROSS/BLUE SHIELD

## 2015-01-24 ENCOUNTER — Encounter: Payer: Self-pay | Admitting: Podiatry

## 2015-01-24 VITALS — BP 141/84 | HR 65 | Resp 16

## 2015-01-24 DIAGNOSIS — B351 Tinea unguium: Secondary | ICD-10-CM | POA: Diagnosis not present

## 2015-01-24 DIAGNOSIS — M779 Enthesopathy, unspecified: Secondary | ICD-10-CM | POA: Diagnosis not present

## 2015-01-24 DIAGNOSIS — M79672 Pain in left foot: Secondary | ICD-10-CM

## 2015-01-25 NOTE — Progress Notes (Signed)
Subjective:     Patient ID: Kristin [name], female   DOB: [birth year], 54 y.o.   MRN: [id number]  HPI patient presents stating the top of my left foot has been hurting and my nails Kristin [name] clear are still abnormal in the way they look   Review of Systems     Objective:   Physical Exam Neurovascular status intact with discomfort in the midfoot left with inflammation and fluid around the extensor tendon complex and nails that are slightly deformed but do not have current yellow discoloration    Assessment:     Possible tendinitis or stress fracture dorsal left foot and nail disease that more trauma with a fungus which has for the most part improved    Plan:     Reviewed both conditions and at this time went ahead reviewed x-rays of left foot and advised on heat and ice therapy. Finish indication we discussed we may consider a pulse pack therapy at one time in the future but that the nails are still can be somewhat damage looking

## 2015-04-23 ENCOUNTER — Encounter: Payer: Self-pay | Admitting: Gastroenterology

## 2015-06-18 ENCOUNTER — Ambulatory Visit (INDEPENDENT_AMBULATORY_CARE_PROVIDER_SITE_OTHER): Payer: BLUE CROSS/BLUE SHIELD | Admitting: Gastroenterology

## 2015-06-18 ENCOUNTER — Encounter: Payer: Self-pay | Admitting: Gastroenterology

## 2015-06-18 VITALS — BP 122/90 | HR 80 | Ht 63.25 in | Wt 160.2 lb

## 2015-06-18 DIAGNOSIS — R1084 Generalized abdominal pain: Secondary | ICD-10-CM | POA: Diagnosis not present

## 2015-06-18 DIAGNOSIS — R195 Other fecal abnormalities: Secondary | ICD-10-CM | POA: Diagnosis not present

## 2015-06-18 DIAGNOSIS — K5900 Constipation, unspecified: Secondary | ICD-10-CM | POA: Diagnosis not present

## 2015-06-18 MED ORDER — NA SULFATE-K SULFATE-MG SULF 17.5-3.13-1.6 GM/177ML PO SOLN: 1.0000 | Freq: Once | ORAL | Status: DC

## 2015-06-18 MED ORDER — HYOSCYAMINE SULFATE 0.125 MG SL SUBL: SUBLINGUAL_TABLET | SUBLINGUAL | Status: DC

## 2015-06-18 MED ORDER — POLYETHYLENE GLYCOL 3350 17 GM/SCOOP PO POWD: ORAL | Status: DC

## 2015-06-18 NOTE — Patient Instructions (Signed)
You have been scheduled for a colonoscopy. Please follow written instructions given to you at your visit today.  Please pick up your prep supplies at the pharmacy within the next 1-3 days. If you use inhalers (even only as needed), please bring them with you on the day of your procedure. Your physician has requested that you go to www.startemmi.com and enter the access code given to you at your visit today. This web site gives a general overview about your procedure. However, you should still follow specific instructions given to you by our office regarding your preparation for the procedure.  We have sent the following medications to your pharmacy for you to pick up at your convenience:Miralax and Levsin.   Thank you for choosing me and [facility].  Pricilla Riffle. Dagoberto Ligas., MD., Marval Regal

## 2015-06-18 NOTE — Progress Notes (Signed)
    History of Present Illness: This is a 55 year old female referred by Everardo Beals, NP for the evaluation of FOBT positive stool. Patient previously underwent colonoscopy by Dr. Carol Ada in January 2013 which was normal. She underwent upper endoscopy by Dr. Verl Blalock in December 2013 which was also normal. She has problems with mild chronic constipation and occasionally has generalized abdominal pain associated with bloating. These symptoms have been present for several years. She has had abdominal MRI, CT scan and abdominal ultrasound and all have been unrevealing. She had a prior cholecystectomy which she states was complicated by infection in [place]. She is also had a C-section and salpingo-oophorectomy. Denies weight loss, diarrhea, change in stool caliber, melena, hematochezia, nausea, vomiting, dysphagia, reflux symptoms, chest pain.  Review of Systems: Pertinent positive and negative review of systems were noted in the above HPI section. All other review of systems were otherwise negative.  Current Medications, Allergies, Past Medical History, Past Surgical History, Family History and Social History were reviewed in [employer] record.  Physical Exam: General: Well developed, well nourished, no acute distress Head: Normocephalic and atraumatic Eyes:  sclerae anicteric, EOMI Ears: Normal auditory acuity Mouth: No deformity or lesions Neck: Supple, no masses or thyromegaly Lungs: Clear throughout to auscultation Heart: Regular rate and rhythm; no murmurs, rubs or bruits Abdomen: Soft, non tender and non distended. RUQ incision and suprapubic incision. No masses, hepatosplenomegaly or hernias noted. Normal Bowel sounds Rectal: deferred to colonoscopy Musculoskeletal: Symmetrical with no gross deformities  Skin: No lesions on visible extremities Pulses:  Normal pulses noted Extremities: No clubbing, cyanosis, edema or deformities noted Neurological:  Alert oriented x 4, grossly nonfocal Cervical Nodes:  No significant cervical adenopathy Inguinal Nodes: No significant inguinal adenopathy Psychological:  Alert and cooperative. Normal mood and affect  Assessment and Recommendations:  1. Occult blood in stool. R/O colorectal neoplasms. Possible occult blood from hemorrhoids. The risks (including bleeding, perforation, infection, missed lesions, medication reactions and possible hospitalization or surgery if complications occur), benefits, and alternatives to colonoscopy with possible biopsy and possible polypectomy were discussed with the patient and they consent to proceed.   2.  Recurrent generalized abdominal pain and bloating. Long standing constipation. 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 Possible pain related to abdominal scar tissue. Continue Metamucil daily. Miralax 1-2 times daily as needed. Levsin 1-2 q4h prn.   cc: Everardo Beals, NP [facility] [address] [place], [place] [address]

## 2015-07-10 ENCOUNTER — Encounter: Payer: BLUE CROSS/BLUE SHIELD | Admitting: Gastroenterology

## 2015-08-29 ENCOUNTER — Ambulatory Visit: Payer: BLUE CROSS/BLUE SHIELD | Admitting: Podiatry

## 2015-11-29 ENCOUNTER — Encounter: Payer: Self-pay | Admitting: Gastroenterology

## 2015-12-20 ENCOUNTER — Ambulatory Visit (AMBULATORY_SURGERY_CENTER): Payer: Self-pay | Admitting: *Deleted

## 2015-12-20 VITALS — Ht 65.0 in | Wt 157.2 lb

## 2015-12-20 DIAGNOSIS — R195 Other fecal abnormalities: Secondary | ICD-10-CM

## 2015-12-20 NOTE — Progress Notes (Signed)
No allergies to eggs or soy. No problems with anesthesia.  No oxygen use  No diet drug use  

## 2015-12-21 ENCOUNTER — Encounter: Payer: Self-pay | Admitting: Gastroenterology

## 2016-01-01 ENCOUNTER — Ambulatory Visit (AMBULATORY_SURGERY_CENTER): Payer: BLUE CROSS/BLUE SHIELD | Admitting: Gastroenterology

## 2016-01-01 ENCOUNTER — Encounter: Payer: Self-pay | Admitting: Gastroenterology

## 2016-01-01 VITALS — BP 109/85 | HR 51 | Temp 97.5°F | Resp 12 | Ht 65.0 in | Wt 157.0 lb

## 2016-01-01 DIAGNOSIS — R195 Other fecal abnormalities: Secondary | ICD-10-CM

## 2016-01-01 DIAGNOSIS — D123 Benign neoplasm of transverse colon: Secondary | ICD-10-CM

## 2016-01-01 MED ORDER — SODIUM CHLORIDE 0.9 % IV SOLN: 500.0000 mL | INTRAVENOUS | Status: AC

## 2016-01-01 NOTE — Progress Notes (Signed)
Called to room to assist during endoscopic procedure.  Patient ID and intended procedure confirmed with present staff. Received instructions for my participation in the procedure from the performing physician.  

## 2016-01-01 NOTE — Patient Instructions (Signed)
YOU HAD AN ENDOSCOPIC PROCEDURE TODAY AT THE [facility]:   Refer to the procedure report that was given to you for any specific questions about what was found during the examination.  If the procedure report does not answer your questions, please call your gastroenterologist to clarify.  If you requested that your care partner not be given the details of your procedure findings, then the procedure report has been included in a sealed envelope for you to review at your convenience later.  YOU SHOULD EXPECT: Some feelings of bloating in the abdomen. Passage of more gas than usual.  Walking can help get rid of the air that was put into your GI tract during the procedure and reduce the bloating. If you had a lower endoscopy (such as a colonoscopy or flexible sigmoidoscopy) you may notice spotting of blood in your stool or on the toilet paper. If you underwent a bowel prep for your procedure, you may not have a normal bowel movement for a few days.  Please Note:  You might notice some irritation and congestion in your nose or some drainage.  This is from the oxygen used during your procedure.  There is no need for concern and it should clear up in a day or so.  SYMPTOMS TO REPORT IMMEDIATELY:   Following lower endoscopy (colonoscopy or flexible sigmoidoscopy):  Excessive amounts of blood in the stool  Significant tenderness or worsening of abdominal pains  Swelling of the abdomen that is new, acute  Fever of 100F or higher   For urgent or emergent issues, a gastroenterologist can be reached at any hour by calling [phone].   DIET:  We do recommend a small meal at first, but then you may proceed to your regular diet.  Drink plenty of fluids but you should avoid alcoholic beverages for 24 hours.  ACTIVITY:  You should plan to take it easy for the rest of today and you should NOT DRIVE or use heavy machinery until tomorrow (because of the sedation medicines used during the test).     FOLLOW UP: Our staff will call the number listed on your records the next business day following your procedure to check on you and address any questions or concerns that you may have regarding the information given to you following your procedure. If we do not reach you, we will leave a message.  However, if you are feeling well and you are not experiencing any problems, there is no need to return our call.  We will assume that you have returned to your regular daily activities without incident.  If any biopsies were taken you will be contacted by phone or by letter within the next 1-3 weeks.  Please call us at [phone] if you have not heard about the biopsies in 3 weeks.    SIGNATURES/CONFIDENTIALITY: You and/or your care partner have signed paperwork which will be entered into your electronic medical record.  These signatures attest to the fact that that the information above on your After Visit Summary has been reviewed and is understood.  Full responsibility of the confidentiality of this discharge information lies with you and/or your care-partner.  Read all of the handouts given to you by your recovery room nurse. 

## 2016-01-01 NOTE — Progress Notes (Signed)
Report to PACU, RN, vss, BBS= Clear.  

## 2016-01-01 NOTE — Op Note (Signed)
[facility] Patient Name: Kristin [name] Procedure Date: 01/01/2016 3:03 PM MRN: [id number] Endoscopist: Ladene Artist , MD Age: 55 Referring MD:  Date of Birth: Oct 14, 1960 Gender: Female Account #: [id number] Procedure:                Colonoscopy Indications:              Evaluation of unexplained GI bleeding, Heme                            positive stool Medicines:                Monitored Anesthesia Care Procedure:                Pre-Anesthesia Assessment:                           - Prior to the procedure, a History and Physical                            was performed, and patient medications and                            allergies were reviewed. The patient's tolerance of                            previous anesthesia was also reviewed. The risks                            and benefits of the procedure and the sedation                            options and risks were discussed with the patient.                            All questions were answered, and informed consent                            was obtained. Prior Anticoagulants: The patient has                            taken no previous anticoagulant or antiplatelet                            agents. ASA Grade Assessment: II - A patient with                            mild systemic disease. After reviewing the risks                            and benefits, the patient was deemed in                            satisfactory condition to undergo the procedure.  After obtaining informed consent, the colonoscope                            was passed under direct vision. Throughout the                            procedure, the patient's blood pressure, pulse, and                            oxygen saturations were monitored continuously. The                            Model PCF-[id number]) scope was introduced                            through the anus and advanced to the the  cecum,                            identified by appendiceal orifice and ileocecal                            valve. The ileocecal valve, appendiceal orifice,                            and rectum were photographed. The quality of the                            bowel preparation was excellent. The colonoscopy                            was performed without difficulty. The patient                            tolerated the procedure well. Scope In: 3:09:44 PM Scope Out: 3:22:47 PM Scope Withdrawal Time: 0 hours 10 minutes 36 seconds  Total Procedure Duration: 0 hours 13 minutes 3 seconds  Findings:                 A 5 mm polyp was found in the transverse colon. The                            polyp was sessile. The polyp was removed with a                            cold snare. Resection and retrieval were complete.                           The exam was otherwise without abnormality on                            direct and retroflexion views. Complications:            No immediate complications. Estimated blood loss:  None. Estimated Blood Loss:     Estimated blood loss: none. Impression:               - One 5 mm polyp in the transverse colon, removed                            with a cold snare. Resected and retrieved.                           - The examination was otherwise normal on direct                            and retroflexion views. Recommendation:           - Repeat colonoscopy in 5 years for surveillance if                            polyp is precancerous, otherwise 10 years.                           - Patient has a contact number available for                            emergencies. The signs and symptoms of potential                            delayed complications were discussed with the                            patient. Return to normal activities tomorrow.                            Written discharge instructions were provided to the                             patient.                           - Resume previous diet.                           - Continue present medications.                           - Await pathology results. Ladene Artist, MD 01/01/2016 3:25:26 PM This report has been signed electronically.

## 2016-01-02 ENCOUNTER — Telehealth: Payer: Self-pay | Admitting: *Deleted

## 2016-01-02 NOTE — Telephone Encounter (Signed)
  Follow up Call-  Call back number 01/01/2016  Post procedure Call Back phone  # [phone]  Permission to leave phone message Yes  Some recent data might be hidden     Patient questions:  Do you have a fever, pain , or abdominal swelling? No. Pain Score  0 *  Have you tolerated food without any problems? Yes.    Have you been able to return to your normal activities? Yes.    Do you have any questions about your discharge instructions: Diet   No. Medications  No. Follow up visit  No.  Do you have questions or concerns about your Care? No.  Actions: * If pain score is 4 or above: No action needed, pain <4.

## 2016-01-10 ENCOUNTER — Encounter: Payer: Self-pay | Admitting: Gastroenterology

## 2016-03-01 ENCOUNTER — Encounter (HOSPITAL_COMMUNITY): Payer: Self-pay | Admitting: Nurse Practitioner

## 2016-03-01 ENCOUNTER — Emergency Department (HOSPITAL_COMMUNITY)
Admission: EM | Admit: 2016-03-01 | Discharge: 2016-03-01 | Disposition: A | Discharge status: Home / Self Care | Payer: BLUE CROSS/BLUE SHIELD | Attending: Emergency Medicine | Admitting: Emergency Medicine

## 2016-03-01 ENCOUNTER — Emergency Department (HOSPITAL_COMMUNITY): Payer: BLUE CROSS/BLUE SHIELD

## 2016-03-01 DIAGNOSIS — R1084 Generalized abdominal pain: Secondary | ICD-10-CM | POA: Insufficient documentation

## 2016-03-01 DIAGNOSIS — R109 Unspecified abdominal pain: Secondary | ICD-10-CM

## 2016-03-01 DIAGNOSIS — Z87891 Personal history of nicotine dependence: Secondary | ICD-10-CM | POA: Diagnosis not present

## 2016-03-01 LAB — CBC
HCT: 38.9 % (ref 36.0–46.0)
Hemoglobin: 13.2 g/dL (ref 12.0–15.0)
MCH: 30.3 pg (ref 26.0–34.0)
MCHC: 33.9 g/dL (ref 30.0–36.0)
MCV: 89.2 fL (ref 78.0–100.0)
Platelets: 317 10*3/uL (ref 150–400)
RBC: 4.36 MIL/uL (ref 3.87–5.11)
RDW: 12.3 % (ref 11.5–15.5)
WBC: 5.7 10*3/uL (ref 4.0–10.5)

## 2016-03-01 LAB — COMPREHENSIVE METABOLIC PANEL
ALT: 18 U/L (ref 14–54)
AST: 27 U/L (ref 15–41)
Albumin: 4.3 g/dL (ref 3.5–5.0)
Alkaline Phosphatase: 48 U/L (ref 38–126)
Anion gap: 10 (ref 5–15)
BUN: 19 mg/dL (ref 6–20)
CO2: 28 mmol/L (ref 22–32)
Calcium: 9.8 mg/dL (ref 8.9–10.3)
Chloride: 105 mmol/L (ref 101–111)
Creatinine, Ser: 0.91 mg/dL (ref 0.44–1.00)
GFR calc Af Amer: >60 mL/min (ref >60)
GFR calc non Af Amer: >60 mL/min (ref >60)
Glucose, Bld: 112 mg/dL — ABNORMAL HIGH (ref 65–99)
Potassium: 4.8 mmol/L (ref 3.5–5.1)
Sodium: 143 mmol/L (ref 135–145)
Total Bilirubin: 0.5 mg/dL (ref 0.3–1.2)
Total Protein: 7 g/dL (ref 6.5–8.1)

## 2016-03-01 LAB — URINALYSIS, ROUTINE W REFLEX MICROSCOPIC
Bilirubin Urine: NEGATIVE
Glucose, UA: NEGATIVE mg/dL
Hgb urine dipstick: NEGATIVE
Ketones, ur: NEGATIVE mg/dL
Leukocytes, UA: NEGATIVE
Nitrite: NEGATIVE
Protein, ur: NEGATIVE mg/dL
Specific Gravity, Urine: 1.018 (ref 1.005–1.030)
pH: 7 (ref 5.0–8.0)

## 2016-03-01 MED ORDER — SODIUM CHLORIDE 0.9 % IV SOLN
1000.0000 mL | Freq: Once | INTRAVENOUS | Status: AC
  Administered 2016-03-01: 1000 mL via INTRAVENOUS

## 2016-03-01 MED ORDER — ONDANSETRON 8 MG PO TBDP: 8.0000 mg | ORAL_TABLET | Freq: Three times a day (TID) | ORAL | 0 refills | Status: DC | PRN

## 2016-03-01 MED ORDER — MORPHINE SULFATE (PF) 4 MG/ML IV SOLN
6.0000 mg | Freq: Once | INTRAVENOUS | Status: AC
  Administered 2016-03-01: 6 mg via INTRAVENOUS
  Filled 2016-03-01: qty 2

## 2016-03-01 MED ORDER — ONDANSETRON HCL 4 MG/2ML IJ SOLN
4.0000 mg | Freq: Once | INTRAMUSCULAR | Status: AC
  Administered 2016-03-01: 4 mg via INTRAVENOUS
  Filled 2016-03-01: qty 2

## 2016-03-01 MED ORDER — IOPAMIDOL (ISOVUE-300) INJECTION 61%
INTRAVENOUS | Status: AC
  Administered 2016-03-01: 100 mL
  Filled 2016-03-01: qty 100

## 2016-03-01 MED ORDER — SODIUM CHLORIDE 0.9 % IV SOLN
1000.0000 mL | INTRAVENOUS | Status: DC
  Administered 2016-03-01: 1000 mL via INTRAVENOUS

## 2016-03-01 NOTE — Discharge Instructions (Signed)
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

## 2016-03-01 NOTE — ED Notes (Signed)
Pt advised we need urine sample but she is unable to go at this time.  She will advise staff when she is able to go.

## 2016-03-01 NOTE — ED Provider Notes (Signed)
[facility] DEPT Provider Note   CSN: [id number] Arrival date & time: 03/01/16  1407     History   Chief Complaint Chief Complaint  Patient presents with  . Abdominal Pain    HPI Kristin [name] is a 55 y.o. female.  HPI Patient presents to the emergency department with complaints of recurrent abdominal pain over the past 5 years.  She's had an extensive workup including follow-up with gastroenterology and GYN follow-up.  She states she feels like her pain is been worsening over the past several weeks and recently was seen by her gynecologist who recommended laparoscopy.  At that time she had turned down laparoscopy but returns to the ER today because of worsening pain over the past several days.  She reports nausea and vomiting and generalized abdominal discomfort.  No fevers or chills.  No change in bowel habits.  Denies dysuria or urinary frequency.  No chest pain shortness breath.  Denies back pain.    Past Medical History:  Diagnosis Date  . Allergy   . Dyspnea   . IBS (irritable bowel syndrome)     Patient Active Problem List   Diagnosis Date Noted  . Dyspnea 02/23/2012    Past Surgical History:  Procedure Laterality Date  . CHOLECYSTECTOMY     completed in [place]  . CYSTOURETHROSCOPY  2016  . HYSTEROSCOPY  2015  . OOPHORECTOMY Left 2000    OB History    No data available       Home Medications    Prior to Admission medications   Medication Sig Start Date End Date Taking? Authorizing Provider  Calcium & Magnesium Carbonates (MYLANTA PO) Take 15 mLs by mouth daily as needed (upset stomach).   Yes Historical Provider, MD  Calcium Carbonate-Vitamin D (CALCIUM-D PO) Take 1 tablet by mouth daily.   Yes Historical Provider, MD  cetirizine (ZYRTEC) 10 MG tablet Take 10 mg by mouth daily as needed for allergies or rhinitis.    Yes Historical Provider, MD  EPINEPHrine (EPIPEN 2-PAK) 0.3 mg/0.3 mL IJ SOAJ injection Inject 0.3 mg into the muscle once as needed  (severe allergic reaction).   Yes Historical Provider, MD  estradiol (ESTRACE) 1 MG tablet Take 1 mg by mouth daily at 12 noon.  02/19/16  Yes Historical Provider, MD  hyoscyamine (LEVSIN/SL) 0.125 MG SL tablet Take 1-2 tablets by mouth every 4 hours as needed for abdominal pain Patient taking differently: Take 0.125 mg by mouth daily as needed for cramping (abdominal pain).  06/18/15  Yes Ladene Artist, MD  ibuprofen (ADVIL,MOTRIN) 200 MG tablet Take 600 mg by mouth 2 (two) times daily as needed (pain).   Yes Historical Provider, MD  Multiple Vitamin (MULTIVITAMIN WITH MINERALS) TABS tablet Take 1 tablet by mouth daily.   Yes Historical Provider, MD  polyethylene glycol powder (GLYCOLAX/MIRALAX) powder Mix 17 grams in 8 oz of water 1-2 x daily Patient taking differently: Take 17 g by mouth daily as needed (constipation). Mix 17 grams in 8 oz of water 06/18/15  Yes Ladene Artist, MD  progesterone (PROMETRIUM) 100 MG capsule Take 100 mg by mouth at bedtime.  05/12/15  Yes Historical Provider, MD  ondansetron (ZOFRAN ODT) 8 MG disintegrating tablet Take 1 tablet (8 mg total) by mouth every 8 (eight) hours as needed for nausea or vomiting. 03/01/16   Jola Schmidt, MD    Family History Family History  Problem Relation Age of Onset  . Heart disease Father   . Liver disease Father   .  Diabetes Mother   . Colon cancer Neg Hx     Social History Social History  Substance Use Topics  . Smoking status: Former Smoker    Packs/day: 1.00    Years: 10.00    Types: Cigarettes    Quit date: 05/19/2004  . Smokeless tobacco: Never Used  . Alcohol use No     Allergies   Other; Oxybutynin chloride er; Trospium chloride er; Diclofenac sodium; and Sporanox [itraconazole]   Review of Systems Review of Systems  All other systems reviewed and are negative.    Physical Exam Updated Vital Signs BP 124/87   Pulse 60   Temp 98.7 F (37.1 C) (Oral)   Resp 18   LMP 12/28/2010   SpO2 96%    Physical Exam  Constitutional: She is oriented to person, place, and time. She appears well-developed and well-nourished. No distress.  HENT:  Head: Normocephalic and atraumatic.  Eyes: EOM are normal.  Neck: Normal range of motion.  Cardiovascular: Normal rate, regular rhythm and normal heart sounds.   Pulmonary/Chest: Effort normal and breath sounds normal.  Abdominal: Soft. She exhibits no distension. There is no tenderness.  Musculoskeletal: Normal range of motion.  Neurological: She is alert and oriented to person, place, and time.  Skin: Skin is warm and dry.  Psychiatric: She has a normal mood and affect. Judgment normal.  Nursing note and vitals reviewed.    ED Treatments / Results  Labs (all labs ordered are listed, but only abnormal results are displayed) Labs Reviewed  COMPREHENSIVE METABOLIC PANEL - Abnormal; Notable for the following:       Result Value   Glucose, Bld 112 (*)    All other components within normal limits  CBC  URINALYSIS, ROUTINE W REFLEX MICROSCOPIC (NOT AT [facility])    EKG  EKG Interpretation None       Radiology Ct Abdomen Pelvis W Contrast  Result Date: 03/01/2016 CLINICAL DATA:  Patient with intermittent lower abdominal pain. Pain worsening over multiple days. EXAM: CT ABDOMEN AND PELVIS WITH CONTRAST TECHNIQUE: Multidetector CT imaging of the abdomen and pelvis was performed using the standard protocol following bolus administration of intravenous contrast. CONTRAST:  100 ISOVUE-300 IOPAMIDOL (ISOVUE-300) INJECTION 61% COMPARISON:  Abdominal ultrasound 04/26/2012. FINDINGS: Lower chest: Normal heart size. No pericardial effusion. Minimal atelectasis within the lower lungs bilaterally. No pleural effusion. Hepatobiliary: Patient status post cholecystectomy. Intrahepatic and extrahepatic biliary ductal dilatation. Common bile duct is dilated up to 11 mm. Pancreas: Unremarkable Spleen: Unremarkable Adrenals/Urinary Tract: Indeterminate 11 mm  left adrenal nodule (image 22; series 201). Right adrenal gland is normal. Kidneys enhance symmetrically with contrast. No hydronephrosis. Urinary bladder is unremarkable. Stomach/Bowel: No abnormal bowel wall thickening or evidence for bowel obstruction. No free fluid or free intraperitoneal air. Normal appendix. Small hiatal hernia. Normal morphology of the stomach. Vascular/Lymphatic: Normal caliber abdominal aorta. No retroperitoneal lymphadenopathy. Gonadal veins are dilated and contrast filled. Reproductive: Uterus and adnexal structures are unremarkable. Other: None. Musculoskeletal: Lower lumbar spine degenerative changes. No aggressive or acute appearing osseous lesions. IMPRESSION: Common bile duct is mildly dilated status post cholecystectomy. This may be physiologic in etiology. Recommend correlation with LFTs. Reflux of contrast material into the gonadal veins as can be seen with pelvic congestion syndrome. **An incidental finding of potential clinical significance has been found. Indeterminate left adrenal mass. Recommend follow-up adrenal protocol CT in 12 months for further evaluation.** Electronically Signed   By: Lovey Newcomer M.D.   On: 03/01/2016 20:13  Procedures Procedures   Medications Ordered in ED Medications  0.9 %  sodium chloride infusion (0 mLs Intravenous Stopped 03/01/16 2000)    Followed by  0.9 %  sodium chloride infusion (1,000 mLs Intravenous New Bag/Given 03/01/16 2000)  ondansetron (ZOFRAN) injection 4 mg (4 mg Intravenous Given 03/01/16 1822)  morphine 4 MG/ML injection 6 mg (6 mg Intravenous Given 03/01/16 1824)  iopamidol (ISOVUE-300) 61 % injection (100 mLs  Contrast Given 03/01/16 1926)     Initial Impression / Assessment and Plan / ED Course  I have reviewed the triage vital signs and the nursing notes.  Pertinent labs & imaging results that were available during my care of the patient were reviewed by me and considered in my medical decision making (see  chart for details).  Clinical Course    Patient is overall well-appearing.  CT scan without acute pathology.  She was informed of the adrenal mass and the need for repeat CT scan in 12 months under adrenal protocol to further evaluate.  I also discussed with her the possibility of pelvic congestion syndrome.  I think she will deftly benefit from laparoscopy if her gynecologist is offering a given the 5 years of recurrent abdominal pain.  She could have significant adhesions and may be having intermittent small bowel obstructions that she has had several abdominal surgeries including cholecystectomy and cesarean section.  No indication for additional workup at this time.  She understands return to the ER for new or worsening symptoms   Final Clinical Impressions(s) / ED Diagnoses   Final diagnoses:  Abdominal pain, unspecified abdominal location    New Prescriptions New Prescriptions   ONDANSETRON (ZOFRAN ODT) 8 MG DISINTEGRATING TABLET    Take 1 tablet (8 mg total) by mouth every 8 (eight) hours as needed for nausea or vomiting.     Jola Schmidt, MD 03/01/16 2159

## 2016-03-01 NOTE — ED Notes (Signed)
Pt ambulatory to restroom at this time. 

## 2016-03-01 NOTE — ED Triage Notes (Signed)
Pt presents with chief complaint of abd pain. She reports several year history of intermittent lower abd pain. She has been to multiple doctors and had multiple tests with no diagnosis. The pain has been worse over past several days. She reports nausea, chills. She denies bowel, bladder changes, vaginal discharge or bleeding. She has tried advil and tramadol with no relief. She is alert and breathing easily

## 2016-03-12 HISTORY — PX: LAPAROSCOPY: SHX197

## 2016-04-28 ENCOUNTER — Encounter: Payer: Self-pay | Admitting: Gastroenterology

## 2016-04-28 ENCOUNTER — Ambulatory Visit (INDEPENDENT_AMBULATORY_CARE_PROVIDER_SITE_OTHER): Payer: BLUE CROSS/BLUE SHIELD | Admitting: Gastroenterology

## 2016-04-28 VITALS — BP 132/80 | HR 64 | Ht 63.25 in | Wt 158.0 lb

## 2016-04-28 DIAGNOSIS — K5900 Constipation, unspecified: Secondary | ICD-10-CM

## 2016-04-28 DIAGNOSIS — R1030 Lower abdominal pain, unspecified: Secondary | ICD-10-CM

## 2016-04-28 NOTE — Patient Instructions (Signed)
Thank you for choosing me and [facility].  Malcolm T. Stark, Jr., MD., FACG  

## 2016-04-28 NOTE — Progress Notes (Signed)
    History of Present Illness: This is a 55 year old female returning for follow-up of lower abdominal pain and constipation. Constipation is well managed with dietary fiber and MiraLAX. Her abdominal pain has not changed. It is unrelated to meals or bowel movement. It occasionally worsens with movement and bending. CT scan below. CMP, CBC in October was unremarkable. She underwent laparoscopy by Dr. Nori Riis on October 25 at [facility] showing dense adhesions associated with her right upper quadrant scar and left adnexal adhesions. Laparoscopy with resection of pelvic adhesions was performed. Her symptoms are unchanged.   Abd/pelvic CT 03/01/2016 IMPRESSION: Common bile duct is mildly dilated status post cholecystectomy. This may be physiologic in etiology. Recommend correlation with LFTs.  Reflux of contrast material into the gonadal veins as can be seen with pelvic congestion syndrome.  **An incidental finding of potential clinical significance has been found. Indeterminate left adrenal mass. Recommend follow-up adrenal protocol CT in 12 months for further evaluation.**  Current Medications, Allergies, Past Medical History, Past Surgical History, Family History and Social History were reviewed in [employer] record.  Physical Exam: General: Well developed, well nourished, no acute distress Head: Normocephalic and atraumatic Eyes:  sclerae anicteric, EOMI Ears: Normal auditory acuity Mouth: No deformity or lesions Lungs: Clear throughout to auscultation Heart: Regular rate and rhythm; no murmurs, rubs or bruits Abdomen: Soft, minimal lower abdominal tender and non distended. No masses, hepatosplenomegaly or hernias noted. Normal Bowel sounds Musculoskeletal: Symmetrical with no gross deformities  Pulses:  Normal pulses noted Extremities: No clubbing, cyanosis, edema or deformities noted Neurological: Alert oriented x 4, grossly  nonfocal Psychological:  Alert and cooperative. Normal mood and affect  Assessment and Recommendations:  1. Lower abdominal pain/pelvic pain. Her symptoms appear to be related to adhesions. Evaluation to date has included a normal colonoscopy, an abdominal/pelvic CT, a GYN evaluation including laparIscopy with resection of pelvic adhesions. No further evaluation was recommended at this time. G follow up prn  2. Mildly dilated common bile duct on CT with normal LFTs and no biliary symptoms. A mildly dilated common bile duct is common following cholecystectomy and without elevated LFTs or biliary symptoms this does not require further evaluation.  3. Left adrenal mass. CT imaging recommended in one year by her GYN or PCP.  4. Constipation. Continue high fiber diet with adequate daily fluid intake and MiraLAX as needed.  I spent 15 minutes of face-to-face time with the patient. Greater than 50% of the time was spent counseling and coordinating care.

## 2017-01-12 ENCOUNTER — Other Ambulatory Visit: Payer: Self-pay

## 2017-02-05 ENCOUNTER — Other Ambulatory Visit: Payer: Self-pay | Admitting: Family Medicine

## 2017-02-05 DIAGNOSIS — E278 Other specified disorders of adrenal gland: Secondary | ICD-10-CM

## 2017-02-09 ENCOUNTER — Ambulatory Visit
Admission: RE | Admit: 2017-02-09 | Discharge: 2017-02-09 | Disposition: A | Discharge status: Home / Self Care | Payer: BLUE CROSS/BLUE SHIELD | Source: Ambulatory Visit | Attending: Family Medicine | Admitting: Family Medicine

## 2017-02-09 DIAGNOSIS — E278 Other specified disorders of adrenal gland: Secondary | ICD-10-CM

## 2017-04-07 ENCOUNTER — Encounter (HOSPITAL_COMMUNITY): Payer: Self-pay | Admitting: Emergency Medicine

## 2017-04-07 ENCOUNTER — Emergency Department (HOSPITAL_COMMUNITY)
Admission: EM | Admit: 2017-04-07 | Discharge: 2017-04-08 | Disposition: A | Discharge status: Home / Self Care | Payer: BLUE CROSS/BLUE SHIELD | Attending: Emergency Medicine | Admitting: Emergency Medicine

## 2017-04-07 ENCOUNTER — Emergency Department (HOSPITAL_COMMUNITY): Payer: BLUE CROSS/BLUE SHIELD

## 2017-04-07 DIAGNOSIS — R51 Headache: Secondary | ICD-10-CM | POA: Insufficient documentation

## 2017-04-07 DIAGNOSIS — R519 Headache, unspecified: Secondary | ICD-10-CM

## 2017-04-07 DIAGNOSIS — Z79899 Other long term (current) drug therapy: Secondary | ICD-10-CM | POA: Insufficient documentation

## 2017-04-07 DIAGNOSIS — I10 Essential (primary) hypertension: Secondary | ICD-10-CM | POA: Diagnosis not present

## 2017-04-07 DIAGNOSIS — Z87891 Personal history of nicotine dependence: Secondary | ICD-10-CM | POA: Diagnosis not present

## 2017-04-07 DIAGNOSIS — R030 Elevated blood-pressure reading, without diagnosis of hypertension: Secondary | ICD-10-CM

## 2017-04-07 HISTORY — DX: Essential (primary) hypertension: I10

## 2017-04-07 MED ORDER — PROCHLORPERAZINE EDISYLATE 5 MG/ML IJ SOLN
10.0000 mg | Freq: Once | INTRAMUSCULAR | Status: AC
  Administered 2017-04-07: 10 mg via INTRAVENOUS
  Filled 2017-04-07: qty 2

## 2017-04-07 MED ORDER — DIPHENHYDRAMINE HCL 50 MG/ML IJ SOLN
12.5000 mg | Freq: Once | INTRAMUSCULAR | Status: AC
  Administered 2017-04-07: 12.5 mg via INTRAVENOUS
  Filled 2017-04-07: qty 1

## 2017-04-07 MED ORDER — SODIUM CHLORIDE 0.9 % IV BOLUS (SEPSIS)
500.0000 mL | Freq: Once | INTRAVENOUS | Status: AC
Start: 2017-04-07 — End: 2017-04-07
  Administered 2017-04-07: 500 mL via INTRAVENOUS

## 2017-04-07 MED ORDER — KETOROLAC TROMETHAMINE 30 MG/ML IJ SOLN
15.0000 mg | Freq: Once | INTRAMUSCULAR | Status: AC
  Administered 2017-04-07: 15 mg via INTRAVENOUS
  Filled 2017-04-07: qty 1

## 2017-04-07 MED ORDER — NAPROXEN 500 MG PO TABS: 500.0000 mg | ORAL_TABLET | Freq: Two times a day (BID) | ORAL | 0 refills | Status: DC | PRN

## 2017-04-07 NOTE — ED Notes (Signed)
Patient transported to CT 

## 2017-04-07 NOTE — ED Provider Notes (Signed)
[facility] EMERGENCY DEPARTMENT Provider Note   CSN: [id number] Arrival date & time: 04/07/17  1949     History   Chief Complaint Chief Complaint  Patient presents with  . Hypertension    HPI Kristin [name] is a 56 y.o. female.  The history is provided by the patient and medical records. No language interpreter was used.  Hypertension  Associated symptoms include headaches.   Kristin [name] is a 56 y.o. female  with a PMH of HTN, IBS who presents to the Emergency Department complaining of persistent daily headaches each morning x 2 weeks. Patient states that she typically does not get headaches, so this has been very concerning for her. She was seen at urgent care and started on butalbital-acetaminophen-caffeine. It was also noticed that her blood pressure was slightly elevated (150's-160's). She has been checking her blood pressure at home and notes that it has been in this same 150-160/90's range at home. She scheduled an appointment with her PCP who started her on 5mg  Amlodipine yesterday. She has now taken two doses. She still has been suffering from headaches, despite taking BP medications, prompting her to seek emergency care. She endorses intermittent nausea, but no vomiting. No visual changes, numbness, tingling, weakness, neck pain, abdominal pain, fever, chills, cough, congestion or sinus pressure.   Past Medical History:  Diagnosis Date  . Allergy   . Dyspnea   . Hypertension   . IBS (irritable bowel syndrome)     Patient Active Problem List   Diagnosis Date Noted  . Dyspnea 02/23/2012    Past Surgical History:  Procedure Laterality Date  . CHOLECYSTECTOMY     completed in [place]  . CYSTOURETHROSCOPY  2016  . HYSTEROSCOPY  2015  . LAPAROSCOPY  03/12/2016   with resection of pelvic adhesions  . OOPHORECTOMY Left 2000    OB History    No data available       Home Medications    Prior to Admission medications   Medication Sig  Start Date End Date Taking? Authorizing Provider  Calcium & Magnesium Carbonates (MYLANTA PO) Take 15 mLs by mouth daily as needed (upset stomach).    [provider]  Calcium Carbonate-Vitamin D (CALCIUM-D PO) Take 1 tablet by mouth daily.    [provider]  cetirizine (ZYRTEC) 10 MG tablet Take 10 mg by mouth daily as needed for allergies or rhinitis.     [provider]  EPINEPHrine (EPIPEN 2-PAK) 0.3 mg/0.3 mL IJ SOAJ injection Inject 0.3 mg into the muscle once as needed (severe allergic reaction).    [provider]  estradiol (ESTRACE) 1 MG tablet Take 1 mg by mouth daily at 12 noon.  02/19/16   [provider]  hyoscyamine (LEVSIN/SL) 0.125 MG SL tablet Take 1-2 tablets by mouth every 4 hours as needed for abdominal pain Patient taking differently: Take 0.125 mg by mouth daily as needed for cramping (abdominal pain).  06/18/15   Ladene Artist, MD  ibuprofen (ADVIL,MOTRIN) 200 MG tablet Take 600 mg by mouth 2 (two) times daily as needed (pain).    [provider]  Multiple Vitamin (MULTIVITAMIN WITH MINERALS) TABS tablet Take 1 tablet by mouth daily.    [provider]  naproxen (NAPROSYN) 500 MG tablet Take 1 tablet (500 mg total) by mouth 2 (two) times daily as needed for headache. 04/07/17   Asaiah Hunnicutt, Ozella Almond, PA-C  ondansetron (ZOFRAN ODT) 8 MG disintegrating tablet Take 1 tablet (8 mg total) by  mouth every 8 (eight) hours as needed for nausea or vomiting. 03/01/16   Jola Schmidt, MD  polyethylene glycol powder (GLYCOLAX/MIRALAX) powder Mix 17 grams in 8 oz of water 1-2 x daily Patient taking differently: Take 17 g by mouth daily as needed (constipation). Mix 17 grams in 8 oz of water 06/18/15   Ladene Artist, MD  progesterone (PROMETRIUM) 100 MG capsule Take 100 mg by mouth at bedtime.  05/12/15   [provider]    Family History Family History  Problem Relation Age of Onset  . Heart disease Father   .  Liver disease Father   . Diabetes Mother   . Colon cancer Neg Hx     Social History Social History   Tobacco Use  . Smoking status: Former Smoker    Packs/day: 1.00    Years: 10.00    Pack years: 10.00    Types: Cigarettes    Last attempt to quit: 05/19/2004    Years since quitting: 12.8  . Smokeless tobacco: Never Used  Substance Use Topics  . Alcohol use: No  . Drug use: No     Allergies   Other; Oxybutynin chloride er; Trospium chloride er; Diclofenac sodium; and Sporanox [itraconazole]   Review of Systems Review of Systems  Neurological: Positive for headaches. Negative for dizziness, syncope, weakness and numbness.  All other systems reviewed and are negative.    Physical Exam Updated Vital Signs BP 111/69   Pulse (!) 58   Temp 97.8 F (36.6 C) (Oral)   Resp 14   Ht 5\' 2"  (1.575 m)   Wt 72.6 kg (160 lb)   LMP 02/23/2012   SpO2 98%   BMI 29.26 kg/m   Physical Exam  Constitutional: She is oriented to person, place, and time. She appears well-developed and well-nourished. No distress.  HENT:  Head: Normocephalic and atraumatic.  Neck:  No midline or paraspinal tenderness.  Cardiovascular: Normal rate, regular rhythm and normal heart sounds.  No murmur heard. Pulmonary/Chest: Effort normal and breath sounds normal. No respiratory distress.  Abdominal: Soft. She exhibits no distension. There is no tenderness.  Neurological: She is alert and oriented to person, place, and time.  Speech clear and goal oriented. CN 2-12 grossly intact. Normal finger-to-nose and rapid alternating movements. No drift. Strength and sensation intact. Steady gait.  Skin: Skin is warm and dry.  Nursing note and vitals reviewed.    ED Treatments / Results  Labs (all labs ordered are listed, but only abnormal results are displayed) Labs Reviewed - No data to display  EKG  EKG Interpretation None       Radiology Ct Head Wo Contrast  Result Date: 04/07/2017 CLINICAL  DATA:  Hypertension and headache for 2 weeks. EXAM: CT HEAD WITHOUT CONTRAST TECHNIQUE: Contiguous axial images were obtained from the base of the skull through the vertex without intravenous contrast. COMPARISON:  None. FINDINGS: Brain: No evidence of acute infarction, hemorrhage, hydrocephalus, extra-axial collection or mass lesion/mass effect. Vascular: No hyperdense vessel or unexpected calcification. Skull: Normal. Negative for fracture or focal lesion. Sinuses/Orbits: Mucosal thickening in the paranasal sinuses. No acute air-fluid levels. Mastoid air cells are clear. Other: None. IMPRESSION: No acute intracranial abnormalities. Electronically Signed   By: Lucienne Capers M.D.   On: 04/07/2017 23:27    Procedures Procedures (including critical care time)  Medications Ordered in ED Medications  prochlorperazine (COMPAZINE) injection 10 mg (10 mg Intravenous Given 04/07/17 2300)  diphenhydrAMINE (BENADRYL) injection 12.5 mg (12.5 mg Intravenous  Given 04/07/17 2301)  sodium chloride 0.9 % bolus 500 mL (0 mLs Intravenous Stopped 04/07/17 2357)  ketorolac (TORADOL) 30 MG/ML injection 15 mg (15 mg Intravenous Given 04/07/17 2300)     Initial Impression / Assessment and Plan / ED Course  I have reviewed the triage vital signs and the nursing notes.  Pertinent labs & imaging results that were available during my care of the patient were reviewed by me and considered in my medical decision making (see chart for details).    Kristin [name] is a 56 y.o. female who presents to ED for headache x 2 weeks with new diagnosis of HTN. Patient denies any history of headaches before two weeks ago. Seen by PCP and started on amlodipine a few days ago. Labs obtained at this visit and results brought with patient. CBC and CMP wdl. No evidence of end-organ damage on labs. Do not feel it is necessary to repeat labs given normal and obtained 4-5 days ago. BP here was elevated when taken in triage, however repeat  BP's have all been very reassuring 111-140/67-92. CT head negative. Headache resolved after migraine cocktail. Evaluation does not show pathology that would require ongoing emergent intervention or inpatient treatment. Continue taking BP meds as directed and keep follow up appointment with PCP next week. Reasons to return to ER discussed with patient and husband at bedside. All questions answered.   Final Clinical Impressions(s) / ED Diagnoses   Final diagnoses:  Bad headache  Elevated blood pressure reading    ED Discharge Orders        Ordered    naproxen (NAPROSYN) 500 MG tablet  2 times daily PRN     04/07/17 2355       Kyllie Pettijohn, Ozella Almond, PA-C 04/08/17 0033    Palumbo, April, MD 04/08/17 0119

## 2017-04-07 NOTE — Discharge Instructions (Signed)
It was my pleasure taking care of you today!  Drink plenty of fluids at home. This will help with your headache.  Naproxen twice daily as needed for headache.  Please follow up with your primary care doctor in the next week or two.   Continue taking your blood pressure medication as directed.   Fortunately, your evaluation and CT scan today is reassuring. With that being said, it is VERY important that you monitor your symptoms at home. If you develop fever, new neck stiffness, rash, weakness, numbness, trouble with your speech, trouble walking, new or worsening symptoms or any concerning symptoms, please return to the ED immediately.

## 2017-04-07 NOTE — ED Triage Notes (Signed)
Pt c/o hypertension and headache x 2 weeks. Seen by PCP and started on amlodipine yesterday.

## 2017-06-19 ENCOUNTER — Ambulatory Visit: Payer: BLUE CROSS/BLUE SHIELD | Admitting: Cardiology

## 2017-06-19 DIAGNOSIS — L98499 Non-pressure chronic ulcer of skin of other sites with unspecified severity: Secondary | ICD-10-CM | POA: Diagnosis not present

## 2017-06-19 DIAGNOSIS — C4491 Basal cell carcinoma of skin, unspecified: Secondary | ICD-10-CM | POA: Diagnosis not present

## 2017-06-19 DIAGNOSIS — L259 Unspecified contact dermatitis, unspecified cause: Secondary | ICD-10-CM | POA: Diagnosis not present

## 2017-06-22 DIAGNOSIS — J301 Allergic rhinitis due to pollen: Secondary | ICD-10-CM | POA: Diagnosis not present

## 2017-06-22 DIAGNOSIS — J3089 Other allergic rhinitis: Secondary | ICD-10-CM | POA: Diagnosis not present

## 2017-06-22 DIAGNOSIS — J3081 Allergic rhinitis due to animal (cat) (dog) hair and dander: Secondary | ICD-10-CM | POA: Diagnosis not present

## 2017-06-24 DIAGNOSIS — C44319 Basal cell carcinoma of skin of other parts of face: Secondary | ICD-10-CM | POA: Diagnosis not present

## 2017-07-03 DIAGNOSIS — J3081 Allergic rhinitis due to animal (cat) (dog) hair and dander: Secondary | ICD-10-CM | POA: Diagnosis not present

## 2017-07-03 DIAGNOSIS — J3089 Other allergic rhinitis: Secondary | ICD-10-CM | POA: Diagnosis not present

## 2017-07-03 DIAGNOSIS — J301 Allergic rhinitis due to pollen: Secondary | ICD-10-CM | POA: Diagnosis not present

## 2017-07-06 DIAGNOSIS — J3089 Other allergic rhinitis: Secondary | ICD-10-CM | POA: Diagnosis not present

## 2017-07-06 DIAGNOSIS — J3081 Allergic rhinitis due to animal (cat) (dog) hair and dander: Secondary | ICD-10-CM | POA: Diagnosis not present

## 2017-07-06 DIAGNOSIS — J301 Allergic rhinitis due to pollen: Secondary | ICD-10-CM | POA: Diagnosis not present

## 2017-07-13 DIAGNOSIS — R102 Pelvic and perineal pain: Secondary | ICD-10-CM | POA: Diagnosis not present

## 2017-07-13 DIAGNOSIS — K5792 Diverticulitis of intestine, part unspecified, without perforation or abscess without bleeding: Secondary | ICD-10-CM | POA: Diagnosis not present

## 2017-07-14 DIAGNOSIS — J3081 Allergic rhinitis due to animal (cat) (dog) hair and dander: Secondary | ICD-10-CM | POA: Diagnosis not present

## 2017-07-14 DIAGNOSIS — J3089 Other allergic rhinitis: Secondary | ICD-10-CM | POA: Diagnosis not present

## 2017-07-14 DIAGNOSIS — J301 Allergic rhinitis due to pollen: Secondary | ICD-10-CM | POA: Diagnosis not present

## 2017-07-19 DIAGNOSIS — R109 Unspecified abdominal pain: Secondary | ICD-10-CM | POA: Diagnosis not present

## 2017-07-22 DIAGNOSIS — J3089 Other allergic rhinitis: Secondary | ICD-10-CM | POA: Diagnosis not present

## 2017-07-22 DIAGNOSIS — J301 Allergic rhinitis due to pollen: Secondary | ICD-10-CM | POA: Diagnosis not present

## 2017-07-22 DIAGNOSIS — J3081 Allergic rhinitis due to animal (cat) (dog) hair and dander: Secondary | ICD-10-CM | POA: Diagnosis not present

## 2017-07-23 DIAGNOSIS — R109 Unspecified abdominal pain: Secondary | ICD-10-CM | POA: Diagnosis not present

## 2017-07-24 DIAGNOSIS — J3081 Allergic rhinitis due to animal (cat) (dog) hair and dander: Secondary | ICD-10-CM | POA: Diagnosis not present

## 2017-07-24 DIAGNOSIS — J3089 Other allergic rhinitis: Secondary | ICD-10-CM | POA: Diagnosis not present

## 2017-07-24 DIAGNOSIS — J301 Allergic rhinitis due to pollen: Secondary | ICD-10-CM | POA: Diagnosis not present

## 2017-07-27 DIAGNOSIS — J3081 Allergic rhinitis due to animal (cat) (dog) hair and dander: Secondary | ICD-10-CM | POA: Diagnosis not present

## 2017-07-27 DIAGNOSIS — J3089 Other allergic rhinitis: Secondary | ICD-10-CM | POA: Diagnosis not present

## 2017-07-27 DIAGNOSIS — J301 Allergic rhinitis due to pollen: Secondary | ICD-10-CM | POA: Diagnosis not present

## 2017-07-29 DIAGNOSIS — J3089 Other allergic rhinitis: Secondary | ICD-10-CM | POA: Diagnosis not present

## 2017-07-29 DIAGNOSIS — J301 Allergic rhinitis due to pollen: Secondary | ICD-10-CM | POA: Diagnosis not present

## 2017-07-29 DIAGNOSIS — J3081 Allergic rhinitis due to animal (cat) (dog) hair and dander: Secondary | ICD-10-CM | POA: Diagnosis not present

## 2017-08-05 DIAGNOSIS — J3081 Allergic rhinitis due to animal (cat) (dog) hair and dander: Secondary | ICD-10-CM | POA: Diagnosis not present

## 2017-08-05 DIAGNOSIS — J3089 Other allergic rhinitis: Secondary | ICD-10-CM | POA: Diagnosis not present

## 2017-08-05 DIAGNOSIS — J301 Allergic rhinitis due to pollen: Secondary | ICD-10-CM | POA: Diagnosis not present

## 2017-08-10 DIAGNOSIS — J3081 Allergic rhinitis due to animal (cat) (dog) hair and dander: Secondary | ICD-10-CM | POA: Diagnosis not present

## 2017-08-10 DIAGNOSIS — J3089 Other allergic rhinitis: Secondary | ICD-10-CM | POA: Diagnosis not present

## 2017-08-10 DIAGNOSIS — J301 Allergic rhinitis due to pollen: Secondary | ICD-10-CM | POA: Diagnosis not present

## 2017-08-19 DIAGNOSIS — J3089 Other allergic rhinitis: Secondary | ICD-10-CM | POA: Diagnosis not present

## 2017-08-19 DIAGNOSIS — J301 Allergic rhinitis due to pollen: Secondary | ICD-10-CM | POA: Diagnosis not present

## 2017-08-19 DIAGNOSIS — J3081 Allergic rhinitis due to animal (cat) (dog) hair and dander: Secondary | ICD-10-CM | POA: Diagnosis not present

## 2017-08-20 DIAGNOSIS — R109 Unspecified abdominal pain: Secondary | ICD-10-CM | POA: Diagnosis not present

## 2017-08-20 DIAGNOSIS — R945 Abnormal results of liver function studies: Secondary | ICD-10-CM | POA: Diagnosis not present

## 2017-08-24 DIAGNOSIS — J301 Allergic rhinitis due to pollen: Secondary | ICD-10-CM | POA: Diagnosis not present

## 2017-08-24 DIAGNOSIS — J3081 Allergic rhinitis due to animal (cat) (dog) hair and dander: Secondary | ICD-10-CM | POA: Diagnosis not present

## 2017-08-24 DIAGNOSIS — J3089 Other allergic rhinitis: Secondary | ICD-10-CM | POA: Diagnosis not present

## 2017-09-01 DIAGNOSIS — N750 Cyst of Bartholin's gland: Secondary | ICD-10-CM | POA: Diagnosis not present

## 2017-09-02 DIAGNOSIS — J3081 Allergic rhinitis due to animal (cat) (dog) hair and dander: Secondary | ICD-10-CM | POA: Diagnosis not present

## 2017-09-02 DIAGNOSIS — J301 Allergic rhinitis due to pollen: Secondary | ICD-10-CM | POA: Diagnosis not present

## 2017-09-02 DIAGNOSIS — J3089 Other allergic rhinitis: Secondary | ICD-10-CM | POA: Diagnosis not present

## 2017-09-07 DIAGNOSIS — J3089 Other allergic rhinitis: Secondary | ICD-10-CM | POA: Diagnosis not present

## 2017-09-07 DIAGNOSIS — J3081 Allergic rhinitis due to animal (cat) (dog) hair and dander: Secondary | ICD-10-CM | POA: Diagnosis not present

## 2017-09-07 DIAGNOSIS — J301 Allergic rhinitis due to pollen: Secondary | ICD-10-CM | POA: Diagnosis not present

## 2017-09-12 DIAGNOSIS — Z85828 Personal history of other malignant neoplasm of skin: Secondary | ICD-10-CM | POA: Diagnosis not present

## 2017-09-12 DIAGNOSIS — L821 Other seborrheic keratosis: Secondary | ICD-10-CM | POA: Diagnosis not present

## 2017-09-12 DIAGNOSIS — L259 Unspecified contact dermatitis, unspecified cause: Secondary | ICD-10-CM | POA: Diagnosis not present

## 2017-09-16 DIAGNOSIS — J3089 Other allergic rhinitis: Secondary | ICD-10-CM | POA: Diagnosis not present

## 2017-09-16 DIAGNOSIS — J3081 Allergic rhinitis due to animal (cat) (dog) hair and dander: Secondary | ICD-10-CM | POA: Diagnosis not present

## 2017-09-16 DIAGNOSIS — J301 Allergic rhinitis due to pollen: Secondary | ICD-10-CM | POA: Diagnosis not present

## 2017-09-21 DIAGNOSIS — J301 Allergic rhinitis due to pollen: Secondary | ICD-10-CM | POA: Diagnosis not present

## 2017-09-21 DIAGNOSIS — J3089 Other allergic rhinitis: Secondary | ICD-10-CM | POA: Diagnosis not present

## 2017-09-21 DIAGNOSIS — J3081 Allergic rhinitis due to animal (cat) (dog) hair and dander: Secondary | ICD-10-CM | POA: Diagnosis not present

## 2017-09-25 DIAGNOSIS — T300 Burn of unspecified body region, unspecified degree: Secondary | ICD-10-CM | POA: Diagnosis not present

## 2017-09-25 DIAGNOSIS — L259 Unspecified contact dermatitis, unspecified cause: Secondary | ICD-10-CM | POA: Diagnosis not present

## 2017-09-30 DIAGNOSIS — Z6828 Body mass index (BMI) 28.0-28.9, adult: Secondary | ICD-10-CM | POA: Diagnosis not present

## 2017-09-30 DIAGNOSIS — Z01419 Encounter for gynecological examination (general) (routine) without abnormal findings: Secondary | ICD-10-CM | POA: Diagnosis not present

## 2017-10-01 DIAGNOSIS — J301 Allergic rhinitis due to pollen: Secondary | ICD-10-CM | POA: Diagnosis not present

## 2017-10-01 DIAGNOSIS — J3081 Allergic rhinitis due to animal (cat) (dog) hair and dander: Secondary | ICD-10-CM | POA: Diagnosis not present

## 2017-10-01 DIAGNOSIS — J3089 Other allergic rhinitis: Secondary | ICD-10-CM | POA: Diagnosis not present

## 2017-10-08 DIAGNOSIS — J301 Allergic rhinitis due to pollen: Secondary | ICD-10-CM | POA: Diagnosis not present

## 2017-10-08 DIAGNOSIS — J3089 Other allergic rhinitis: Secondary | ICD-10-CM | POA: Diagnosis not present

## 2017-10-08 DIAGNOSIS — J3081 Allergic rhinitis due to animal (cat) (dog) hair and dander: Secondary | ICD-10-CM | POA: Diagnosis not present

## 2017-10-15 DIAGNOSIS — J301 Allergic rhinitis due to pollen: Secondary | ICD-10-CM | POA: Diagnosis not present

## 2017-10-15 DIAGNOSIS — J3089 Other allergic rhinitis: Secondary | ICD-10-CM | POA: Diagnosis not present

## 2017-10-15 DIAGNOSIS — J3081 Allergic rhinitis due to animal (cat) (dog) hair and dander: Secondary | ICD-10-CM | POA: Diagnosis not present

## 2017-10-20 DIAGNOSIS — M25512 Pain in left shoulder: Secondary | ICD-10-CM | POA: Diagnosis not present

## 2017-10-22 DIAGNOSIS — Z13228 Encounter for screening for other metabolic disorders: Secondary | ICD-10-CM | POA: Diagnosis not present

## 2017-10-22 DIAGNOSIS — J301 Allergic rhinitis due to pollen: Secondary | ICD-10-CM | POA: Diagnosis not present

## 2017-10-22 DIAGNOSIS — J3089 Other allergic rhinitis: Secondary | ICD-10-CM | POA: Diagnosis not present

## 2017-10-22 DIAGNOSIS — J3081 Allergic rhinitis due to animal (cat) (dog) hair and dander: Secondary | ICD-10-CM | POA: Diagnosis not present

## 2017-10-22 DIAGNOSIS — E785 Hyperlipidemia, unspecified: Secondary | ICD-10-CM | POA: Diagnosis not present

## 2017-10-26 DIAGNOSIS — J3089 Other allergic rhinitis: Secondary | ICD-10-CM | POA: Diagnosis not present

## 2017-10-26 DIAGNOSIS — J3081 Allergic rhinitis due to animal (cat) (dog) hair and dander: Secondary | ICD-10-CM | POA: Diagnosis not present

## 2017-10-26 DIAGNOSIS — J301 Allergic rhinitis due to pollen: Secondary | ICD-10-CM | POA: Diagnosis not present

## 2017-10-28 DIAGNOSIS — M25512 Pain in left shoulder: Secondary | ICD-10-CM | POA: Diagnosis not present

## 2017-10-28 DIAGNOSIS — E781 Pure hyperglyceridemia: Secondary | ICD-10-CM | POA: Diagnosis not present

## 2017-10-29 DIAGNOSIS — J301 Allergic rhinitis due to pollen: Secondary | ICD-10-CM | POA: Diagnosis not present

## 2017-10-29 DIAGNOSIS — J3089 Other allergic rhinitis: Secondary | ICD-10-CM | POA: Diagnosis not present

## 2017-10-30 DIAGNOSIS — J3089 Other allergic rhinitis: Secondary | ICD-10-CM | POA: Diagnosis not present

## 2017-11-05 DIAGNOSIS — J3089 Other allergic rhinitis: Secondary | ICD-10-CM | POA: Diagnosis not present

## 2017-11-05 DIAGNOSIS — J3081 Allergic rhinitis due to animal (cat) (dog) hair and dander: Secondary | ICD-10-CM | POA: Diagnosis not present

## 2017-11-05 DIAGNOSIS — J301 Allergic rhinitis due to pollen: Secondary | ICD-10-CM | POA: Diagnosis not present

## 2017-11-09 DIAGNOSIS — J3081 Allergic rhinitis due to animal (cat) (dog) hair and dander: Secondary | ICD-10-CM | POA: Diagnosis not present

## 2017-11-09 DIAGNOSIS — J301 Allergic rhinitis due to pollen: Secondary | ICD-10-CM | POA: Diagnosis not present

## 2017-11-09 DIAGNOSIS — J3089 Other allergic rhinitis: Secondary | ICD-10-CM | POA: Diagnosis not present

## 2017-11-10 DIAGNOSIS — M25512 Pain in left shoulder: Secondary | ICD-10-CM | POA: Diagnosis not present

## 2017-11-17 DIAGNOSIS — J3081 Allergic rhinitis due to animal (cat) (dog) hair and dander: Secondary | ICD-10-CM | POA: Diagnosis not present

## 2017-11-17 DIAGNOSIS — J301 Allergic rhinitis due to pollen: Secondary | ICD-10-CM | POA: Diagnosis not present

## 2017-11-17 DIAGNOSIS — J3089 Other allergic rhinitis: Secondary | ICD-10-CM | POA: Diagnosis not present

## 2017-11-23 DIAGNOSIS — J3089 Other allergic rhinitis: Secondary | ICD-10-CM | POA: Diagnosis not present

## 2017-11-23 DIAGNOSIS — J301 Allergic rhinitis due to pollen: Secondary | ICD-10-CM | POA: Diagnosis not present

## 2017-11-23 DIAGNOSIS — J3081 Allergic rhinitis due to animal (cat) (dog) hair and dander: Secondary | ICD-10-CM | POA: Diagnosis not present

## 2017-11-25 DIAGNOSIS — J3089 Other allergic rhinitis: Secondary | ICD-10-CM | POA: Diagnosis not present

## 2017-11-25 DIAGNOSIS — J301 Allergic rhinitis due to pollen: Secondary | ICD-10-CM | POA: Diagnosis not present

## 2017-11-25 DIAGNOSIS — J3081 Allergic rhinitis due to animal (cat) (dog) hair and dander: Secondary | ICD-10-CM | POA: Diagnosis not present

## 2017-11-26 DIAGNOSIS — J301 Allergic rhinitis due to pollen: Secondary | ICD-10-CM | POA: Diagnosis not present

## 2017-11-26 DIAGNOSIS — J4520 Mild intermittent asthma, uncomplicated: Secondary | ICD-10-CM | POA: Diagnosis not present

## 2017-11-26 DIAGNOSIS — J3089 Other allergic rhinitis: Secondary | ICD-10-CM | POA: Diagnosis not present

## 2017-12-01 DIAGNOSIS — M19072 Primary osteoarthritis, left ankle and foot: Secondary | ICD-10-CM | POA: Diagnosis not present

## 2017-12-01 DIAGNOSIS — J3081 Allergic rhinitis due to animal (cat) (dog) hair and dander: Secondary | ICD-10-CM | POA: Diagnosis not present

## 2017-12-01 DIAGNOSIS — M67471 Ganglion, right ankle and foot: Secondary | ICD-10-CM | POA: Diagnosis not present

## 2017-12-01 DIAGNOSIS — J301 Allergic rhinitis due to pollen: Secondary | ICD-10-CM | POA: Diagnosis not present

## 2017-12-01 DIAGNOSIS — J3089 Other allergic rhinitis: Secondary | ICD-10-CM | POA: Diagnosis not present

## 2017-12-10 DIAGNOSIS — J4520 Mild intermittent asthma, uncomplicated: Secondary | ICD-10-CM | POA: Diagnosis not present

## 2017-12-10 DIAGNOSIS — J301 Allergic rhinitis due to pollen: Secondary | ICD-10-CM | POA: Diagnosis not present

## 2017-12-10 DIAGNOSIS — J3089 Other allergic rhinitis: Secondary | ICD-10-CM | POA: Diagnosis not present

## 2017-12-10 DIAGNOSIS — J3081 Allergic rhinitis due to animal (cat) (dog) hair and dander: Secondary | ICD-10-CM | POA: Diagnosis not present

## 2017-12-11 DIAGNOSIS — M109 Gout, unspecified: Secondary | ICD-10-CM | POA: Diagnosis not present

## 2017-12-15 DIAGNOSIS — J301 Allergic rhinitis due to pollen: Secondary | ICD-10-CM | POA: Diagnosis not present

## 2017-12-15 DIAGNOSIS — L258 Unspecified contact dermatitis due to other agents: Secondary | ICD-10-CM | POA: Diagnosis not present

## 2017-12-15 DIAGNOSIS — J4520 Mild intermittent asthma, uncomplicated: Secondary | ICD-10-CM | POA: Diagnosis not present

## 2017-12-15 DIAGNOSIS — J3089 Other allergic rhinitis: Secondary | ICD-10-CM | POA: Diagnosis not present

## 2017-12-17 DIAGNOSIS — J3081 Allergic rhinitis due to animal (cat) (dog) hair and dander: Secondary | ICD-10-CM | POA: Diagnosis not present

## 2017-12-17 DIAGNOSIS — J301 Allergic rhinitis due to pollen: Secondary | ICD-10-CM | POA: Diagnosis not present

## 2017-12-17 DIAGNOSIS — J4520 Mild intermittent asthma, uncomplicated: Secondary | ICD-10-CM | POA: Diagnosis not present

## 2017-12-17 DIAGNOSIS — J3089 Other allergic rhinitis: Secondary | ICD-10-CM | POA: Diagnosis not present

## 2017-12-21 DIAGNOSIS — R102 Pelvic and perineal pain: Secondary | ICD-10-CM | POA: Diagnosis not present

## 2017-12-21 DIAGNOSIS — Z79890 Hormone replacement therapy (postmenopausal): Secondary | ICD-10-CM | POA: Diagnosis not present

## 2017-12-21 DIAGNOSIS — N950 Postmenopausal bleeding: Secondary | ICD-10-CM | POA: Diagnosis not present

## 2018-01-04 DIAGNOSIS — J3081 Allergic rhinitis due to animal (cat) (dog) hair and dander: Secondary | ICD-10-CM | POA: Diagnosis not present

## 2018-01-04 DIAGNOSIS — J301 Allergic rhinitis due to pollen: Secondary | ICD-10-CM | POA: Diagnosis not present

## 2018-01-04 DIAGNOSIS — J3089 Other allergic rhinitis: Secondary | ICD-10-CM | POA: Diagnosis not present

## 2018-01-06 DIAGNOSIS — J3081 Allergic rhinitis due to animal (cat) (dog) hair and dander: Secondary | ICD-10-CM | POA: Diagnosis not present

## 2018-01-06 DIAGNOSIS — J3089 Other allergic rhinitis: Secondary | ICD-10-CM | POA: Diagnosis not present

## 2018-01-06 DIAGNOSIS — J301 Allergic rhinitis due to pollen: Secondary | ICD-10-CM | POA: Diagnosis not present

## 2018-01-07 ENCOUNTER — Other Ambulatory Visit: Payer: Self-pay | Admitting: Obstetrics & Gynecology

## 2018-01-07 DIAGNOSIS — N950 Postmenopausal bleeding: Secondary | ICD-10-CM | POA: Diagnosis not present

## 2018-01-07 DIAGNOSIS — N840 Polyp of corpus uteri: Secondary | ICD-10-CM | POA: Diagnosis not present

## 2018-01-07 DIAGNOSIS — Z79890 Hormone replacement therapy (postmenopausal): Secondary | ICD-10-CM | POA: Diagnosis not present

## 2018-01-08 DIAGNOSIS — M67471 Ganglion, right ankle and foot: Secondary | ICD-10-CM | POA: Diagnosis not present

## 2018-01-15 DIAGNOSIS — J3089 Other allergic rhinitis: Secondary | ICD-10-CM | POA: Diagnosis not present

## 2018-01-15 DIAGNOSIS — J3081 Allergic rhinitis due to animal (cat) (dog) hair and dander: Secondary | ICD-10-CM | POA: Diagnosis not present

## 2018-01-15 DIAGNOSIS — J301 Allergic rhinitis due to pollen: Secondary | ICD-10-CM | POA: Diagnosis not present

## 2018-01-19 DIAGNOSIS — J301 Allergic rhinitis due to pollen: Secondary | ICD-10-CM | POA: Diagnosis not present

## 2018-01-19 DIAGNOSIS — J3081 Allergic rhinitis due to animal (cat) (dog) hair and dander: Secondary | ICD-10-CM | POA: Diagnosis not present

## 2018-01-19 DIAGNOSIS — J3089 Other allergic rhinitis: Secondary | ICD-10-CM | POA: Diagnosis not present

## 2018-01-20 DIAGNOSIS — R1084 Generalized abdominal pain: Secondary | ICD-10-CM | POA: Diagnosis not present

## 2018-01-20 DIAGNOSIS — N938 Other specified abnormal uterine and vaginal bleeding: Secondary | ICD-10-CM | POA: Diagnosis not present

## 2018-01-20 DIAGNOSIS — R102 Pelvic and perineal pain: Secondary | ICD-10-CM | POA: Diagnosis not present

## 2018-01-23 ENCOUNTER — Other Ambulatory Visit: Payer: Self-pay

## 2018-01-23 ENCOUNTER — Emergency Department (HOSPITAL_COMMUNITY): Payer: 59

## 2018-01-23 ENCOUNTER — Emergency Department (HOSPITAL_COMMUNITY)
Admission: EM | Admit: 2018-01-23 | Discharge: 2018-01-24 | Disposition: A | Discharge status: Home / Self Care | Payer: 59 | Attending: Emergency Medicine | Admitting: Emergency Medicine

## 2018-01-23 ENCOUNTER — Encounter (HOSPITAL_COMMUNITY): Payer: Self-pay | Admitting: *Deleted

## 2018-01-23 DIAGNOSIS — R109 Unspecified abdominal pain: Secondary | ICD-10-CM | POA: Diagnosis not present

## 2018-01-23 DIAGNOSIS — Z79899 Other long term (current) drug therapy: Secondary | ICD-10-CM | POA: Diagnosis not present

## 2018-01-23 DIAGNOSIS — N939 Abnormal uterine and vaginal bleeding, unspecified: Secondary | ICD-10-CM | POA: Diagnosis not present

## 2018-01-23 DIAGNOSIS — R1030 Lower abdominal pain, unspecified: Secondary | ICD-10-CM | POA: Diagnosis not present

## 2018-01-23 DIAGNOSIS — Z87891 Personal history of nicotine dependence: Secondary | ICD-10-CM | POA: Insufficient documentation

## 2018-01-23 DIAGNOSIS — R102 Pelvic and perineal pain: Secondary | ICD-10-CM

## 2018-01-23 DIAGNOSIS — I10 Essential (primary) hypertension: Secondary | ICD-10-CM | POA: Diagnosis not present

## 2018-01-23 DIAGNOSIS — R1033 Periumbilical pain: Secondary | ICD-10-CM | POA: Diagnosis not present

## 2018-01-23 LAB — URINALYSIS, MICROSCOPIC (REFLEX): RBC / HPF: >50 RBC/hpf (ref 0–5)

## 2018-01-23 LAB — URINALYSIS, ROUTINE W REFLEX MICROSCOPIC
Bilirubin Urine: NEGATIVE
Bilirubin Urine: NEGATIVE
Glucose, UA: NEGATIVE mg/dL
Glucose, UA: NEGATIVE mg/dL
Hgb urine dipstick: NEGATIVE
Ketones, ur: 15 mg/dL — AB
Ketones, ur: NEGATIVE mg/dL
Leukocytes, UA: NEGATIVE
Leukocytes, UA: NEGATIVE
Nitrite: NEGATIVE
Nitrite: NEGATIVE
Protein, ur: 100 mg/dL — AB
Protein, ur: NEGATIVE mg/dL
Specific Gravity, Urine: 1.015 (ref 1.005–1.030)
Specific Gravity, Urine: 1.027 (ref 1.005–1.030)
pH: 7.5 (ref 5.0–8.0)
pH: 7 (ref 5.0–8.0)

## 2018-01-23 LAB — CBC
HCT: 39.4 % (ref 36.0–46.0)
Hemoglobin: 12.8 g/dL (ref 12.0–15.0)
MCH: 30.3 pg (ref 26.0–34.0)
MCHC: 32.5 g/dL (ref 30.0–36.0)
MCV: 93.1 fL (ref 78.0–100.0)
Platelets: 308 10*3/uL (ref 150–400)
RBC: 4.23 MIL/uL (ref 3.87–5.11)
RDW: 11.9 % (ref 11.5–15.5)
WBC: 6.5 10*3/uL (ref 4.0–10.5)

## 2018-01-23 LAB — COMPREHENSIVE METABOLIC PANEL
ALT: 24 U/L (ref 0–44)
AST: 25 U/L (ref 15–41)
Albumin: 3.7 g/dL (ref 3.5–5.0)
Alkaline Phosphatase: 46 U/L (ref 38–126)
Anion gap: 8 (ref 5–15)
BUN: 22 mg/dL — ABNORMAL HIGH (ref 6–20)
CO2: 25 mmol/L (ref 22–32)
Calcium: 8.7 mg/dL — ABNORMAL LOW (ref 8.9–10.3)
Chloride: 106 mmol/L (ref 98–111)
Creatinine, Ser: 0.78 mg/dL (ref 0.44–1.00)
GFR calc Af Amer: >60 mL/min (ref >60)
GFR calc non Af Amer: >60 mL/min (ref >60)
Glucose, Bld: 127 mg/dL — ABNORMAL HIGH (ref 70–99)
Potassium: 4 mmol/L (ref 3.5–5.1)
Sodium: 139 mmol/L (ref 135–145)
Total Bilirubin: 0.3 mg/dL (ref 0.3–1.2)
Total Protein: 6.4 g/dL — ABNORMAL LOW (ref 6.5–8.1)

## 2018-01-23 LAB — LIPASE, BLOOD: Lipase: 36 U/L (ref 11–51)

## 2018-01-23 MED ORDER — IBUPROFEN 800 MG PO TABS
800.0000 mg | ORAL_TABLET | Freq: Once | ORAL | Status: AC
  Administered 2018-01-23: 800 mg via ORAL
  Filled 2018-01-23: qty 1

## 2018-01-23 MED ORDER — IOPAMIDOL (ISOVUE-300) INJECTION 61%
100.0000 mL | Freq: Once | INTRAVENOUS | Status: AC | PRN
  Administered 2018-01-23: 100 mL via INTRAVENOUS

## 2018-01-23 MED ORDER — IOPAMIDOL (ISOVUE-300) INJECTION 61%
INTRAVENOUS | Status: AC
  Filled 2018-01-23: qty 100

## 2018-01-23 MED ORDER — CEPHALEXIN 500 MG PO CAPS: 500.0000 mg | ORAL_CAPSULE | Freq: Three times a day (TID) | ORAL | 0 refills | Status: DC

## 2018-01-23 NOTE — ED Triage Notes (Signed)
The pt started having vaginal bleeding aug 7th  Dr neal removed vaginal polyps aug 22nd since then she has been bleeding and having pain .  She saw the doctor  And was given pain med and she is still having bleeding

## 2018-01-23 NOTE — ED Provider Notes (Signed)
[place] EMERGENCY DEPARTMENT Provider Note   CSN: [id number] Arrival date & time: 01/23/18  1925     History   Chief Complaint Chief Complaint  Patient presents with  . Abdominal Pain    HPI Kristin [name] is a 57 y.o. female.  HPI  57 year old female presents today complaining of ongoing suprapubic abdominal pain since endometrial polypectomy by Dr. Milta Deiters on August 22.  She states she has continued to have some bleeding which she describes as spotting and dark blood.  She states that she has pain in the lower abdominal area and points to the suprapubic area which is crampy and ongoing.  She denies any nausea, vomiting, diarrhea, fever, or chills.  She has seen Dr. Milta Deiters twice in follow-up.  She states they told her that he thought that it was be something else if she was continued to have pain.  She presents tonight with ongoing pain.  She has taken oxycodone at home without relief.  She states that she can take ibuprofen although she it is noted in her chart that he is allergic to diclofenac  Past Medical History:  Diagnosis Date  . Allergy   . Dyspnea   . Hypertension   . IBS (irritable bowel syndrome)     Patient Active Problem List   Diagnosis Date Noted  . Dyspnea 02/23/2012    Past Surgical History:  Procedure Laterality Date  . CHOLECYSTECTOMY     completed in [place]  . CYSTOURETHROSCOPY  2016  . HYSTEROSCOPY  2015  . LAPAROSCOPY  03/12/2016   with resection of pelvic adhesions  . OOPHORECTOMY Left 2000     OB History   None      Home Medications    Prior to Admission medications   Medication Sig Start Date End Date Taking? Authorizing Provider  Calcium & Magnesium Carbonates (MYLANTA PO) Take 15 mLs by mouth daily as needed (upset stomach).    [provider]  Calcium Carbonate-Vitamin D (CALCIUM-D PO) Take 1 tablet by mouth daily.    [provider]  cetirizine (ZYRTEC) 10 MG tablet Take 10 mg by mouth daily as  needed for allergies or rhinitis.     [provider]  EPINEPHrine (EPIPEN 2-PAK) 0.3 mg/0.3 mL IJ SOAJ injection Inject 0.3 mg into the muscle once as needed (severe allergic reaction).    [provider]  estradiol (ESTRACE) 1 MG tablet Take 1 mg by mouth daily at 12 noon.  02/19/16   [provider]  hyoscyamine (LEVSIN/SL) 0.125 MG SL tablet Take 1-2 tablets by mouth every 4 hours as needed for abdominal pain Patient taking differently: Take 0.125 mg by mouth daily as needed for cramping (abdominal pain).  06/18/15   Ladene Artist, MD  ibuprofen (ADVIL,MOTRIN) 200 MG tablet Take 600 mg by mouth 2 (two) times daily as needed (pain).    [provider]  Multiple Vitamin (MULTIVITAMIN WITH MINERALS) TABS tablet Take 1 tablet by mouth daily.    [provider]  naproxen (NAPROSYN) 500 MG tablet Take 1 tablet (500 mg total) by mouth 2 (two) times daily as needed for headache. 04/07/17   Ward, Ozella Almond, PA-C  ondansetron (ZOFRAN ODT) 8 MG disintegrating tablet Take 1 tablet (8 mg total) by mouth every 8 (eight) hours as needed for nausea or vomiting. 03/01/16   Jola Schmidt, MD  polyethylene glycol powder (GLYCOLAX/MIRALAX) powder Mix 17 grams in 8 oz of water 1-2 x daily Patient taking differently: Take  17 g by mouth daily as needed (constipation). Mix 17 grams in 8 oz of water 06/18/15   Ladene Artist, MD  progesterone (PROMETRIUM) 100 MG capsule Take 100 mg by mouth at bedtime.  05/12/15   [provider]    Family History Family History  Problem Relation Age of Onset  . Heart disease Father   . Liver disease Father   . Diabetes Mother   . Colon cancer Neg Hx     Social History Social History   Tobacco Use  . Smoking status: Former Smoker    Packs/day: 1.00    Years: 10.00    Pack years: 10.00    Types: Cigarettes    Last attempt to quit: 05/19/2004    Years since quitting: 13.6  . Smokeless tobacco: Never Used    Substance Use Topics  . Alcohol use: No  . Drug use: No     Allergies   Other; Oxybutynin chloride er; Trospium chloride er; Diclofenac sodium; and Sporanox [itraconazole]   Review of Systems Review of Systems  All other systems reviewed and are negative.    Physical Exam Updated Vital Signs BP (!) 148/97   Pulse 74   Temp 97.9 F (36.6 C) (Oral)   Resp 14   Ht 1.6 m (5\' 3" )   Wt 70.8 kg   LMP 02/23/2012   SpO2 100%   BMI 27.63 kg/m   Physical Exam  Constitutional: She is oriented to person, place, and time. She appears well-developed and well-nourished.  HENT:  Head: Normocephalic and atraumatic.  Mouth/Throat: Oropharynx is clear and moist.  Eyes: Pupils are equal, round, and reactive to light.  Cardiovascular: Normal rate.  Abdominal: Normal appearance and bowel sounds are normal. There is tenderness in the suprapubic area.  Neurological: She is alert and oriented to person, place, and time.  Skin: Skin is warm and dry. Capillary refill takes less than 2 seconds.  Psychiatric: She has a normal mood and affect.  Nursing note and vitals reviewed.    ED Treatments / Results  Labs (all labs ordered are listed, but only abnormal results are displayed) Labs Reviewed  COMPREHENSIVE METABOLIC PANEL - Abnormal; Notable for the following components:      Result Value   Glucose, Bld 127 (*)    BUN 22 (*)    Calcium 8.7 (*)    Total Protein 6.4 (*)    All other components within normal limits  URINALYSIS, ROUTINE W REFLEX MICROSCOPIC - Abnormal; Notable for the following components:   Color, Urine BROWN (*)    APPearance TURBID (*)    Hgb urine dipstick LARGE (*)    Ketones, ur 15 (*)    Protein, ur 100 (*)    All other components within normal limits  URINALYSIS, MICROSCOPIC (REFLEX) - Abnormal; Notable for the following components:   Bacteria, UA RARE (*)    All other components within normal limits  LIPASE, BLOOD  CBC  URINALYSIS, ROUTINE W REFLEX  MICROSCOPIC    EKG None  Radiology No results found.  Procedures Procedures (including critical care time)  Medications Ordered in ED Medications  ibuprofen (ADVIL,MOTRIN) tablet 800 mg (has no administration in time range)     Initial Impression / Assessment and Plan / ED Course  I have reviewed the triage vital signs and the nursing notes.  Pertinent labs & imaging results that were available during my care of the patient were reviewed by me and considered in my medical decision making (  see chart for details).   57 year old female with recent Demetrio polypectomy has had ongoing lower abdominal pain.  Abdomen is soft and nontender.  CT obtained to evaluate for other intra-abdominal or pelvic abnormality.  This is normal.  Urinalysis initially was contaminated.  Repeat urinalysis shows no evidence of infection.  Discussed return precautions and need for follow-up and patient voices understanding    Final Clinical Impressions(s) / ED Diagnoses   Final diagnoses:  Suprapubic pain    ED Discharge Orders    None       Pattricia Boss, MD 01/23/18 2344

## 2018-01-23 NOTE — Discharge Instructions (Signed)
Your labs here are normal A CT of the abdomen and pelvis did not show any acute abnormalities Please take ibuprofen at home as needed for pain Please follow-up with Dr. Nori Riis next week

## 2018-01-25 LAB — URINE CULTURE: Culture: NO GROWTH

## 2018-01-26 DIAGNOSIS — J3081 Allergic rhinitis due to animal (cat) (dog) hair and dander: Secondary | ICD-10-CM | POA: Diagnosis not present

## 2018-01-26 DIAGNOSIS — J301 Allergic rhinitis due to pollen: Secondary | ICD-10-CM | POA: Diagnosis not present

## 2018-01-26 DIAGNOSIS — J3089 Other allergic rhinitis: Secondary | ICD-10-CM | POA: Diagnosis not present

## 2018-01-27 DIAGNOSIS — L259 Unspecified contact dermatitis, unspecified cause: Secondary | ICD-10-CM | POA: Diagnosis not present

## 2018-02-01 DIAGNOSIS — J301 Allergic rhinitis due to pollen: Secondary | ICD-10-CM | POA: Diagnosis not present

## 2018-02-01 DIAGNOSIS — J3081 Allergic rhinitis due to animal (cat) (dog) hair and dander: Secondary | ICD-10-CM | POA: Diagnosis not present

## 2018-02-01 DIAGNOSIS — J3089 Other allergic rhinitis: Secondary | ICD-10-CM | POA: Diagnosis not present

## 2018-02-01 DIAGNOSIS — Z1231 Encounter for screening mammogram for malignant neoplasm of breast: Secondary | ICD-10-CM | POA: Diagnosis not present

## 2018-02-03 DIAGNOSIS — R1010 Upper abdominal pain, unspecified: Secondary | ICD-10-CM | POA: Diagnosis not present

## 2018-02-03 DIAGNOSIS — R1084 Generalized abdominal pain: Secondary | ICD-10-CM | POA: Diagnosis not present

## 2018-02-03 DIAGNOSIS — R1030 Lower abdominal pain, unspecified: Secondary | ICD-10-CM | POA: Diagnosis not present

## 2018-02-03 DIAGNOSIS — R140 Abdominal distension (gaseous): Secondary | ICD-10-CM | POA: Diagnosis not present

## 2018-02-04 DIAGNOSIS — R1084 Generalized abdominal pain: Secondary | ICD-10-CM | POA: Diagnosis not present

## 2018-02-04 DIAGNOSIS — R140 Abdominal distension (gaseous): Secondary | ICD-10-CM | POA: Diagnosis not present

## 2018-02-08 DIAGNOSIS — E785 Hyperlipidemia, unspecified: Secondary | ICD-10-CM | POA: Diagnosis not present

## 2018-02-08 DIAGNOSIS — R102 Pelvic and perineal pain: Secondary | ICD-10-CM | POA: Diagnosis not present

## 2018-02-08 DIAGNOSIS — N938 Other specified abnormal uterine and vaginal bleeding: Secondary | ICD-10-CM | POA: Diagnosis not present

## 2018-02-10 DIAGNOSIS — J301 Allergic rhinitis due to pollen: Secondary | ICD-10-CM | POA: Diagnosis not present

## 2018-02-10 DIAGNOSIS — J3081 Allergic rhinitis due to animal (cat) (dog) hair and dander: Secondary | ICD-10-CM | POA: Diagnosis not present

## 2018-02-10 DIAGNOSIS — J3089 Other allergic rhinitis: Secondary | ICD-10-CM | POA: Diagnosis not present

## 2018-02-18 DIAGNOSIS — J301 Allergic rhinitis due to pollen: Secondary | ICD-10-CM | POA: Diagnosis not present

## 2018-02-18 DIAGNOSIS — J3081 Allergic rhinitis due to animal (cat) (dog) hair and dander: Secondary | ICD-10-CM | POA: Diagnosis not present

## 2018-02-18 DIAGNOSIS — J3089 Other allergic rhinitis: Secondary | ICD-10-CM | POA: Diagnosis not present

## 2018-02-22 DIAGNOSIS — R300 Dysuria: Secondary | ICD-10-CM | POA: Diagnosis not present

## 2018-02-22 DIAGNOSIS — R102 Pelvic and perineal pain: Secondary | ICD-10-CM | POA: Diagnosis not present

## 2018-02-22 DIAGNOSIS — N939 Abnormal uterine and vaginal bleeding, unspecified: Secondary | ICD-10-CM | POA: Diagnosis not present

## 2018-02-24 DIAGNOSIS — K5900 Constipation, unspecified: Secondary | ICD-10-CM | POA: Diagnosis not present

## 2018-02-24 DIAGNOSIS — R140 Abdominal distension (gaseous): Secondary | ICD-10-CM | POA: Diagnosis not present

## 2018-02-25 DIAGNOSIS — J301 Allergic rhinitis due to pollen: Secondary | ICD-10-CM | POA: Diagnosis not present

## 2018-02-25 DIAGNOSIS — J3089 Other allergic rhinitis: Secondary | ICD-10-CM | POA: Diagnosis not present

## 2018-02-25 DIAGNOSIS — J3081 Allergic rhinitis due to animal (cat) (dog) hair and dander: Secondary | ICD-10-CM | POA: Diagnosis not present

## 2018-03-01 DIAGNOSIS — J3089 Other allergic rhinitis: Secondary | ICD-10-CM | POA: Diagnosis not present

## 2018-03-01 DIAGNOSIS — J301 Allergic rhinitis due to pollen: Secondary | ICD-10-CM | POA: Diagnosis not present

## 2018-03-01 DIAGNOSIS — J3081 Allergic rhinitis due to animal (cat) (dog) hair and dander: Secondary | ICD-10-CM | POA: Diagnosis not present

## 2018-03-12 DIAGNOSIS — J209 Acute bronchitis, unspecified: Secondary | ICD-10-CM | POA: Diagnosis not present

## 2018-03-14 DIAGNOSIS — J209 Acute bronchitis, unspecified: Secondary | ICD-10-CM | POA: Diagnosis not present

## 2018-03-14 DIAGNOSIS — T7840XA Allergy, unspecified, initial encounter: Secondary | ICD-10-CM | POA: Diagnosis not present

## 2018-03-15 DIAGNOSIS — J3089 Other allergic rhinitis: Secondary | ICD-10-CM | POA: Diagnosis not present

## 2018-03-15 DIAGNOSIS — J3081 Allergic rhinitis due to animal (cat) (dog) hair and dander: Secondary | ICD-10-CM | POA: Diagnosis not present

## 2018-03-15 DIAGNOSIS — L659 Nonscarring hair loss, unspecified: Secondary | ICD-10-CM | POA: Diagnosis not present

## 2018-03-15 DIAGNOSIS — L905 Scar conditions and fibrosis of skin: Secondary | ICD-10-CM | POA: Diagnosis not present

## 2018-03-15 DIAGNOSIS — L209 Atopic dermatitis, unspecified: Secondary | ICD-10-CM | POA: Diagnosis not present

## 2018-03-15 DIAGNOSIS — J301 Allergic rhinitis due to pollen: Secondary | ICD-10-CM | POA: Diagnosis not present

## 2018-03-19 DIAGNOSIS — E559 Vitamin D deficiency, unspecified: Secondary | ICD-10-CM | POA: Diagnosis not present

## 2018-03-19 DIAGNOSIS — R109 Unspecified abdominal pain: Secondary | ICD-10-CM | POA: Diagnosis not present

## 2018-03-22 DIAGNOSIS — R109 Unspecified abdominal pain: Secondary | ICD-10-CM | POA: Diagnosis not present

## 2018-03-22 DIAGNOSIS — K589 Irritable bowel syndrome without diarrhea: Secondary | ICD-10-CM | POA: Diagnosis not present

## 2018-03-22 DIAGNOSIS — G8929 Other chronic pain: Secondary | ICD-10-CM | POA: Diagnosis not present

## 2018-03-23 ENCOUNTER — Encounter: Payer: Self-pay | Admitting: Obstetrics & Gynecology

## 2018-03-23 ENCOUNTER — Other Ambulatory Visit: Payer: Self-pay

## 2018-03-23 ENCOUNTER — Ambulatory Visit (INDEPENDENT_AMBULATORY_CARE_PROVIDER_SITE_OTHER): Payer: Commercial Managed Care - PPO | Admitting: Obstetrics & Gynecology

## 2018-03-23 ENCOUNTER — Other Ambulatory Visit: Payer: Self-pay | Admitting: *Deleted

## 2018-03-23 VITALS — BP 146/92 | HR 100 | Resp 16 | Ht 63.0 in | Wt 158.4 lb

## 2018-03-23 DIAGNOSIS — N950 Postmenopausal bleeding: Secondary | ICD-10-CM

## 2018-03-23 DIAGNOSIS — R102 Pelvic and perineal pain: Secondary | ICD-10-CM

## 2018-03-23 DIAGNOSIS — N840 Polyp of corpus uteri: Secondary | ICD-10-CM | POA: Diagnosis not present

## 2018-03-23 DIAGNOSIS — Z79890 Hormone replacement therapy (postmenopausal): Secondary | ICD-10-CM

## 2018-03-23 DIAGNOSIS — R1031 Right lower quadrant pain: Secondary | ICD-10-CM

## 2018-03-23 DIAGNOSIS — G8929 Other chronic pain: Secondary | ICD-10-CM

## 2018-03-23 NOTE — Progress Notes (Signed)
Patient scheduled while in office for PUS on 04/01/18 at 2:30pm with consult at 3pm with Dr. Sabra Heck. Patient verbalizes understanding and is agreeable.

## 2018-03-23 NOTE — Progress Notes (Signed)
GYNECOLOGY  VISIT  CC:   Surgical consultation/discuss RLQ pain issues  HPI: 57 y.o. G36P1001 Married White or Caucasian female here as new patient for hysterectomy consult and/or recommendations regarding intermittent RLQ pain and h/o endometrial polyps and PMP bleeding.  Pt reports she has been experiencing intermittent RLQ pain for several years.  This occurs about twice yearly.  Pain is low and crampy in nature.  Does not seem to have any associated bowel or bladder symptoms when this occurs.  Had negative CT 9/19.  Final impression was:    IMPRESSION: No acute abnormality of the abdomen or pelvis.  She has seen Dr. Earlean Shawl, GI, and did have negative colonoscopy.    She does have a hx of endometrial polyps that have been treated twice.  Most recently, she had bleeding that started in August 5th.  She did have hysteroscopy with polyp resection and D&C with final pathology showing endometrial polyps.  She continued to have bleeding/spotting after procedure but has not had any bleeding since October.    Has been seeing Dr. Nori Riis who recommended a hysterectomy.  She would like another opinion regarding this.  Of note, he did perform a laparoscopy 03/12/16 that showed some pelvic adhesions around the right ovary that had been previous removed.  Operative note reviewed.  Does not seem like there were many pelvic adhesions.  She is on HRT and has been on this for several years.  HRT dosage keeps increasing per her hx.  Currently taking 2mg  estradiol and Prometrium 200mg  nightly.    She did see Dr. Landry Mellow at [facility] 02/22/18.  She was not convinced that surgery was necessary.  She has seen urology in the past, Dr. Rosana Hoes.  She did have a cystoscopy with biopsy showing 25% mast cells.  Pt's hx is significant for open cholecystectomy that was complicated by infection that required repeat surgery.  This was done in [place].    Pap 09/2017 was negative.  GYNECOLOGIC HISTORY: Patient's last menstrual  period was 02/23/2012. Contraception: PMP Menopausal hormone therapy: estradiol 2mg , progesterone 200mg   Patient Active Problem List   Diagnosis Date Noted  . Dyspnea 02/23/2012    Past Medical History:  Diagnosis Date  . Allergy   . Dyspnea   . Endometriosis   . Hypertension   . IBS (irritable bowel syndrome)   . Skin cancer, basal cell     Past Surgical History:  Procedure Laterality Date  . CHOLECYSTECTOMY     completed in [place]  . CYSTOURETHROSCOPY  2016  . HYSTEROSCOPY  2015  . LAPAROSCOPY  03/12/2016   with resection of pelvic adhesions  . OOPHORECTOMY Left 2000    MEDS:   Current Outpatient Medications on File Prior to Visit  Medication Sig Dispense Refill  . acetaminophen (TYLENOL) 500 MG tablet Take 500 mg by mouth every 6 (six) hours as needed (pain).    . clobetasol (TEMOVATE) 0.05 % external solution APP EXT TO THE SCALP BID FOR 30 DAYS THEN STOP  0  . Clocortolone Pivalate (CLODERM) 0.1 % cream APP EXT AA BID  0  . Crisaborole (EUCRISA) 2 % OINT daily as needed.    Marland Kitchen EPINEPHrine (EPIPEN 2-PAK) 0.3 mg/0.3 mL IJ SOAJ injection Inject 0.3 mg into the muscle once as needed (severe allergic reaction).    Marland Kitchen estradiol (ESTRACE) 2 MG tablet Take 2 mg by mouth daily with lunch.     . hyoscyamine (LEVBID) 0.375 MG 12 hr tablet at bedtime.  0  . hyoscyamine (  LEVSIN SL) 0.125 MG SL tablet Take by mouth daily as needed.    Marland Kitchen ibuprofen (ADVIL,MOTRIN) 200 MG tablet Take 200-600 mg by mouth 2 (two) times daily as needed (pain).     Marland Kitchen levofloxacin (LEVAQUIN) 500 MG tablet Take 1 tablet by mouth 2 (two) times daily.  0  . Multiple Vitamin (MULTIVITAMIN WITH MINERALS) TABS tablet Take 1 tablet by mouth at bedtime.     . naproxen (NAPROSYN) 500 MG tablet Take 1 tablet (500 mg total) by mouth 2 (two) times daily as needed for headache. 30 tablet 0  . predniSONE (DELTASONE) 20 MG tablet prednisone 20 mg tablet    . progesterone (PROMETRIUM) 200 MG capsule Take 1 capsule by mouth  daily.  12  . vitamin E 400 UNIT capsule Take 400 Units by mouth daily with lunch.     Current Facility-Administered Medications on File Prior to Visit  Medication Dose Route Frequency Provider Last Rate Last Dose  . 0.9 %  sodium chloride infusion  500 mL Intravenous Continuous Ladene Artist, MD        ALLERGIES: Azithromycin; Nickel; Other; Oxybutynin chloride er; Trospium chloride er; Diclofenac sodium; Oxybutynin chloride; and Sporanox [itraconazole]  Family History  Problem Relation Age of Onset  . Heart disease Father   . Liver disease Father   . Diabetes Mother   . Colon cancer Neg Hx     SH:  Married, non smoker  Review of Systems  All other systems reviewed and are negative.   PHYSICAL EXAMINATION:    BP (!) 146/92 (BP Location: Right Arm, Patient Position: Sitting, Cuff Size: Large)   Pulse 100   Resp 16   Ht 5\' 3"  (1.6 m)   Wt 158 lb 6.4 oz (71.8 kg)   LMP 02/23/2012   BMI 28.06 kg/m     General appearance: alert, cooperative and appears stated age CV:  Regular rate and rhythm Lungs:  clear to auscultation, no wheezes, rales or rhonchi, symmetric air entry Abdomen: soft, non-tender; bowel sounds normal; no masses,  no organomegaly Lymph:  no inguinal LAD noted  Pelvic: External genitalia:  no lesions              Urethra:  normal appearing urethra with no masses, tenderness or lesions              Bartholins and Skenes: normal                 Vagina: normal appearing vagina with normal color and discharge, no lesions              Cervix: no lesions              Bimanual Exam:  Uterus:  normal size, contour, position, consistency, mobility, non-tender              Adnexa: no mass, fullness, tenderness    Pelvic floor exam is normal  Chaperone was present for exam.  Assessment: Intermittent RLQ pain, none recently H/o endometrial polyps x 2, s/p hysteroscopic resection with D&C H/o laparoscopy 2017 with minimal pelvic adhesions H/o laparoscopic LSO  2000 H/O open cholecystectomy in [place] 1990 with post operative infection and re-exploration HRT use  Plan: Pt has seen numerous providers in several different specialties without any concrete findings noted or specific diagnosis being made.  Pt aware that I do not know if I will be able to add anything to the evaluation that has already been completed.  At this time,  I do not feel confident that a hysterectomy is going to resolve her intermittent pain issue and I do not think this is a good reason to consider hysterectomy. Will proceed with repeat PUS to evaluate endometrium and ensure polyps have been fully resected.   Recommend pt start weaning HRT.  Will decreased estradiol to 1mg  daily.  If does not have any symptoms, will continue to lower dosage.  At this time, will not make any changes to her prometrium dosage.   ~45 minutes spent with patient >50% of time was in face to face discussion of above.

## 2018-03-25 DIAGNOSIS — J3081 Allergic rhinitis due to animal (cat) (dog) hair and dander: Secondary | ICD-10-CM | POA: Diagnosis not present

## 2018-03-25 DIAGNOSIS — J3089 Other allergic rhinitis: Secondary | ICD-10-CM | POA: Diagnosis not present

## 2018-03-25 DIAGNOSIS — J301 Allergic rhinitis due to pollen: Secondary | ICD-10-CM | POA: Diagnosis not present

## 2018-03-31 DIAGNOSIS — J3081 Allergic rhinitis due to animal (cat) (dog) hair and dander: Secondary | ICD-10-CM | POA: Diagnosis not present

## 2018-03-31 DIAGNOSIS — J3089 Other allergic rhinitis: Secondary | ICD-10-CM | POA: Diagnosis not present

## 2018-03-31 DIAGNOSIS — J301 Allergic rhinitis due to pollen: Secondary | ICD-10-CM | POA: Diagnosis not present

## 2018-04-01 ENCOUNTER — Ambulatory Visit (INDEPENDENT_AMBULATORY_CARE_PROVIDER_SITE_OTHER): Payer: Commercial Managed Care - PPO | Admitting: Obstetrics & Gynecology

## 2018-04-01 ENCOUNTER — Ambulatory Visit (INDEPENDENT_AMBULATORY_CARE_PROVIDER_SITE_OTHER): Payer: Commercial Managed Care - PPO

## 2018-04-01 VITALS — BP 144/90 | HR 84 | Resp 16 | Ht 63.0 in | Wt 163.0 lb

## 2018-04-01 DIAGNOSIS — N950 Postmenopausal bleeding: Secondary | ICD-10-CM | POA: Diagnosis not present

## 2018-04-01 DIAGNOSIS — R102 Pelvic and perineal pain: Secondary | ICD-10-CM

## 2018-04-01 DIAGNOSIS — R1031 Right lower quadrant pain: Secondary | ICD-10-CM

## 2018-04-01 DIAGNOSIS — G8929 Other chronic pain: Secondary | ICD-10-CM

## 2018-04-01 DIAGNOSIS — N840 Polyp of corpus uteri: Secondary | ICD-10-CM | POA: Diagnosis not present

## 2018-04-01 MED ORDER — ESTRADIOL 1 MG PO TABS: 1.0000 mg | ORAL_TABLET | Freq: Every day | ORAL | 2 refills | Status: DC

## 2018-04-01 NOTE — Progress Notes (Signed)
57 y.o. G43P1001 Married White or Caucasian female here for pelvic ultrasound due to PMP bleeding and hx of endometrial polyps.  Has not had any bleeding in about six weeks.  She is here for endometrial evaluation today.  Patient's last menstrual period was 02/23/2012.  Contraception: PMP  Findings:  UTERUS: 8.1 x 5.1 x 3.6cm with 1.6 x 1.2cm and 0.6 x 0.8cm fibroid. EMS: 2.7 - 2.43mm ADNEXA: Left ovary:  Surgically absent       Right ovary:  2.0 x 1.2 x 0.9cm, atrophic CUL DE SAC: no free fluid  Discussion:  Findings discussed with pt.  Endometrium is symmetric and thin.  Do not feel needs additional evaluation at this time.  Also do not feel she should proceed with hysterectomy at this time.  I would like to reexamine her when she has again in the future.  Pt comfortable with this plan.  All questions answered.  She knows to call if has any additional BV.  Highly recommended starting to wean down in HRT dosage.  She will start by decreasing HRT to 1mg  daily.  Pt comfortable with plan.  Assessment:  H/o PMP bleeding, small fibroids, and endometrial polyps but endometrium is 41mm today and hse has not had bleeding in ~2 months Uterine fibroids  Plan:  Recommend continuing to monitor at this point and for her to call with recurrent RLQ pain so I can try and examine her while the pain is occurring.  Pt voices understanding and comfort with plan.    ~15 minutes spent with patient >50% of time was in face to face discussion of above.

## 2018-04-04 ENCOUNTER — Encounter: Payer: Self-pay | Admitting: Obstetrics & Gynecology

## 2018-04-08 DIAGNOSIS — J301 Allergic rhinitis due to pollen: Secondary | ICD-10-CM | POA: Diagnosis not present

## 2018-04-08 DIAGNOSIS — J3081 Allergic rhinitis due to animal (cat) (dog) hair and dander: Secondary | ICD-10-CM | POA: Diagnosis not present

## 2018-04-08 DIAGNOSIS — J3089 Other allergic rhinitis: Secondary | ICD-10-CM | POA: Diagnosis not present

## 2018-04-12 DIAGNOSIS — J3081 Allergic rhinitis due to animal (cat) (dog) hair and dander: Secondary | ICD-10-CM | POA: Diagnosis not present

## 2018-04-12 DIAGNOSIS — J301 Allergic rhinitis due to pollen: Secondary | ICD-10-CM | POA: Diagnosis not present

## 2018-04-12 DIAGNOSIS — J3089 Other allergic rhinitis: Secondary | ICD-10-CM | POA: Diagnosis not present

## 2018-04-20 DIAGNOSIS — J3089 Other allergic rhinitis: Secondary | ICD-10-CM | POA: Diagnosis not present

## 2018-04-20 DIAGNOSIS — J301 Allergic rhinitis due to pollen: Secondary | ICD-10-CM | POA: Diagnosis not present

## 2018-04-20 DIAGNOSIS — J3081 Allergic rhinitis due to animal (cat) (dog) hair and dander: Secondary | ICD-10-CM | POA: Diagnosis not present

## 2018-04-27 DIAGNOSIS — L309 Dermatitis, unspecified: Secondary | ICD-10-CM | POA: Diagnosis not present

## 2018-04-27 DIAGNOSIS — L659 Nonscarring hair loss, unspecified: Secondary | ICD-10-CM | POA: Diagnosis not present

## 2018-04-29 DIAGNOSIS — J301 Allergic rhinitis due to pollen: Secondary | ICD-10-CM | POA: Diagnosis not present

## 2018-04-29 DIAGNOSIS — J3089 Other allergic rhinitis: Secondary | ICD-10-CM | POA: Diagnosis not present

## 2018-04-29 DIAGNOSIS — J3081 Allergic rhinitis due to animal (cat) (dog) hair and dander: Secondary | ICD-10-CM | POA: Diagnosis not present

## 2018-05-03 DIAGNOSIS — J301 Allergic rhinitis due to pollen: Secondary | ICD-10-CM | POA: Diagnosis not present

## 2018-05-03 DIAGNOSIS — J3081 Allergic rhinitis due to animal (cat) (dog) hair and dander: Secondary | ICD-10-CM | POA: Diagnosis not present

## 2018-05-03 DIAGNOSIS — J3089 Other allergic rhinitis: Secondary | ICD-10-CM | POA: Diagnosis not present

## 2018-05-03 DIAGNOSIS — L669 Cicatricial alopecia, unspecified: Secondary | ICD-10-CM | POA: Diagnosis not present

## 2018-05-05 DIAGNOSIS — J301 Allergic rhinitis due to pollen: Secondary | ICD-10-CM | POA: Diagnosis not present

## 2018-05-05 DIAGNOSIS — J3081 Allergic rhinitis due to animal (cat) (dog) hair and dander: Secondary | ICD-10-CM | POA: Diagnosis not present

## 2018-05-06 DIAGNOSIS — J3089 Other allergic rhinitis: Secondary | ICD-10-CM | POA: Diagnosis not present

## 2018-05-10 DIAGNOSIS — Z0000 Encounter for general adult medical examination without abnormal findings: Secondary | ICD-10-CM | POA: Diagnosis not present

## 2018-05-10 DIAGNOSIS — E559 Vitamin D deficiency, unspecified: Secondary | ICD-10-CM | POA: Diagnosis not present

## 2018-05-11 DIAGNOSIS — J3081 Allergic rhinitis due to animal (cat) (dog) hair and dander: Secondary | ICD-10-CM | POA: Diagnosis not present

## 2018-05-11 DIAGNOSIS — J3089 Other allergic rhinitis: Secondary | ICD-10-CM | POA: Diagnosis not present

## 2018-05-11 DIAGNOSIS — J301 Allergic rhinitis due to pollen: Secondary | ICD-10-CM | POA: Diagnosis not present

## 2018-05-14 DIAGNOSIS — J3089 Other allergic rhinitis: Secondary | ICD-10-CM | POA: Diagnosis not present

## 2018-05-14 DIAGNOSIS — J301 Allergic rhinitis due to pollen: Secondary | ICD-10-CM | POA: Diagnosis not present

## 2018-05-14 DIAGNOSIS — J3081 Allergic rhinitis due to animal (cat) (dog) hair and dander: Secondary | ICD-10-CM | POA: Diagnosis not present

## 2018-05-17 DIAGNOSIS — Z0000 Encounter for general adult medical examination without abnormal findings: Secondary | ICD-10-CM | POA: Diagnosis not present

## 2018-05-17 DIAGNOSIS — Z6826 Body mass index (BMI) 26.0-26.9, adult: Secondary | ICD-10-CM | POA: Diagnosis not present

## 2018-05-18 DIAGNOSIS — J301 Allergic rhinitis due to pollen: Secondary | ICD-10-CM | POA: Diagnosis not present

## 2018-05-18 DIAGNOSIS — L03811 Cellulitis of head [any part, except face]: Secondary | ICD-10-CM | POA: Diagnosis not present

## 2018-05-18 DIAGNOSIS — J3081 Allergic rhinitis due to animal (cat) (dog) hair and dander: Secondary | ICD-10-CM | POA: Diagnosis not present

## 2018-05-18 DIAGNOSIS — J3089 Other allergic rhinitis: Secondary | ICD-10-CM | POA: Diagnosis not present

## 2018-05-20 DIAGNOSIS — L669 Cicatricial alopecia, unspecified: Secondary | ICD-10-CM | POA: Diagnosis not present

## 2018-05-20 DIAGNOSIS — L251 Unspecified contact dermatitis due to drugs in contact with skin: Secondary | ICD-10-CM | POA: Diagnosis not present

## 2018-05-21 DIAGNOSIS — J3081 Allergic rhinitis due to animal (cat) (dog) hair and dander: Secondary | ICD-10-CM | POA: Diagnosis not present

## 2018-05-21 DIAGNOSIS — J301 Allergic rhinitis due to pollen: Secondary | ICD-10-CM | POA: Diagnosis not present

## 2018-05-21 DIAGNOSIS — J3089 Other allergic rhinitis: Secondary | ICD-10-CM | POA: Diagnosis not present

## 2018-05-27 DIAGNOSIS — J3089 Other allergic rhinitis: Secondary | ICD-10-CM | POA: Diagnosis not present

## 2018-05-27 DIAGNOSIS — J3081 Allergic rhinitis due to animal (cat) (dog) hair and dander: Secondary | ICD-10-CM | POA: Diagnosis not present

## 2018-05-27 DIAGNOSIS — J301 Allergic rhinitis due to pollen: Secondary | ICD-10-CM | POA: Diagnosis not present

## 2018-05-31 ENCOUNTER — Telehealth: Payer: Self-pay | Admitting: *Deleted

## 2018-05-31 ENCOUNTER — Ambulatory Visit: Payer: Commercial Managed Care - PPO | Admitting: Obstetrics & Gynecology

## 2018-05-31 DIAGNOSIS — J301 Allergic rhinitis due to pollen: Secondary | ICD-10-CM | POA: Diagnosis not present

## 2018-05-31 DIAGNOSIS — J3089 Other allergic rhinitis: Secondary | ICD-10-CM | POA: Diagnosis not present

## 2018-05-31 DIAGNOSIS — J3081 Allergic rhinitis due to animal (cat) (dog) hair and dander: Secondary | ICD-10-CM | POA: Diagnosis not present

## 2018-05-31 NOTE — Telephone Encounter (Addendum)
Appt for breast irritation canceled. Please reschedule for 1/16 @2 :15pm

## 2018-05-31 NOTE — Telephone Encounter (Signed)
Patient returned call. Rescheduled to 1/16 at 2:15.

## 2018-05-31 NOTE — Telephone Encounter (Signed)
Need to reschedule OV - Dr. Sabra Heck delayed in surgery

## 2018-06-03 ENCOUNTER — Encounter: Payer: Self-pay | Admitting: Obstetrics & Gynecology

## 2018-06-03 ENCOUNTER — Other Ambulatory Visit: Payer: Self-pay

## 2018-06-03 ENCOUNTER — Ambulatory Visit (INDEPENDENT_AMBULATORY_CARE_PROVIDER_SITE_OTHER): Payer: Commercial Managed Care - PPO | Admitting: Obstetrics & Gynecology

## 2018-06-03 VITALS — BP 138/90 | HR 92 | Resp 16 | Ht 63.0 in | Wt 159.4 lb

## 2018-06-03 DIAGNOSIS — E2839 Other primary ovarian failure: Secondary | ICD-10-CM

## 2018-06-03 DIAGNOSIS — R234 Changes in skin texture: Secondary | ICD-10-CM | POA: Diagnosis not present

## 2018-06-03 DIAGNOSIS — J3081 Allergic rhinitis due to animal (cat) (dog) hair and dander: Secondary | ICD-10-CM | POA: Diagnosis not present

## 2018-06-03 DIAGNOSIS — J3089 Other allergic rhinitis: Secondary | ICD-10-CM | POA: Diagnosis not present

## 2018-06-03 DIAGNOSIS — J301 Allergic rhinitis due to pollen: Secondary | ICD-10-CM | POA: Diagnosis not present

## 2018-06-03 NOTE — Progress Notes (Signed)
GYNECOLOGY  VISIT  CC:   Breast mass  HPI: 58 y.o. G45P1001 Married White or Caucasian female here for breast mass and pain x 5 days.  She does have some pain when she extends her left arm.  Denies trauma.  Denies skin changes on her breast.  She has not had any nipple discharge.  She also has a little skin erythema that she's noted as well.  Last MMG was done at [facility] in 2019.  I do not have a copy of this.  She is on HRT.    GYNECOLOGIC HISTORY: Patient's last menstrual period was 02/23/2012. Contraception: PMP Menopausal hormone therapy: none  Patient Active Problem List   Diagnosis Date Noted  . Dyspnea 02/23/2012    Past Medical History:  Diagnosis Date  . Allergy   . Dyspnea   . Endometriosis   . Hypertension   . IBS (irritable bowel syndrome)   . Skin cancer, basal cell     Past Surgical History:  Procedure Laterality Date  . CESAREAN SECTION  1983  . CHOLECYSTECTOMY  1990   In [place]  . CYSTOURETHROSCOPY  2016  . HYSTEROSCOPY  2015  . LAPAROSCOPY  03/12/2016   with resection of pelvic adhesions  . LAPAROTOMY  1990   post operative infection from cholecystectomy  . OOPHORECTOMY Left 2000    MEDS:   Current Outpatient Medications on File Prior to Visit  Medication Sig Dispense Refill  . acetaminophen (TYLENOL) 500 MG tablet Take 500 mg by mouth every 6 (six) hours as needed (pain).    . clobetasol (TEMOVATE) 0.05 % external solution APP EXT TO THE SCALP BID FOR 30 DAYS THEN STOP  0  . Crisaborole (EUCRISA) 2 % OINT daily as needed.    Marland Kitchen EPINEPHrine (EPIPEN 2-PAK) 0.3 mg/0.3 mL IJ SOAJ injection Inject 0.3 mg into the muscle once as needed (severe allergic reaction).    Marland Kitchen estradiol (ESTRACE) 1 MG tablet Take 1 tablet (1 mg total) by mouth daily with lunch. 90 tablet 2  . hyoscyamine (LEVBID) 0.375 MG 12 hr tablet at bedtime.  0  . ibuprofen (ADVIL,MOTRIN) 200 MG tablet Take 200-600 mg by mouth 2 (two) times daily as needed (pain).     . Multiple  Vitamin (MULTIVITAMIN WITH MINERALS) TABS tablet Take 1 tablet by mouth at bedtime.     . progesterone (PROMETRIUM) 200 MG capsule Take 1 capsule by mouth daily.  12  . vitamin E 400 UNIT capsule Take 400 Units by mouth daily with lunch.    . hyoscyamine (LEVSIN SL) 0.125 MG SL tablet Take by mouth daily as needed.     Current Facility-Administered Medications on File Prior to Visit  Medication Dose Route Frequency Provider Last Rate Last Dose  . 0.9 %  sodium chloride infusion  500 mL Intravenous Continuous Ladene Artist, MD        ALLERGIES: Azithromycin; Nickel; Other; Oxybutynin chloride er; Trospium chloride er; Diclofenac sodium; Oxybutynin chloride; and Sporanox [itraconazole]  Family History  Problem Relation Age of Onset  . Heart disease Father   . Liver disease Father   . Diabetes Mother   . Colon cancer Neg Hx     SH:  Married, non smoker  Review of Systems  Genitourinary:       Breast lump Breast itching   Skin: Positive for rash.  All other systems reviewed and are negative.   PHYSICAL EXAMINATION:    BP 138/90 (BP Location: Right Arm, Patient Position:  Sitting, Cuff Size: Large)   Pulse 92   Resp 16   Ht 5\' 3"  (1.6 m)   Wt 159 lb 6.4 oz (72.3 kg)   LMP 02/23/2012   BMI 28.24 kg/m     Physical Exam  Constitutional: She appears well-developed and well-nourished.  Neck: Normal range of motion. Neck supple. No thyromegaly present.  Respiratory: Right breast exhibits skin change. Right breast exhibits no inverted nipple, no mass, no nipple discharge and no tenderness. Left breast exhibits no inverted nipple, no mass, no nipple discharge, no skin change and no tenderness. Breasts are symmetrical.    Lymphadenopathy:    She has no cervical adenopathy.    She has no axillary adenopathy.  Skin: Skin is warm and dry.  Psychiatric: She has a normal mood and affect.    Chaperone was present for exam.  Assessment: Breast skin change but no mass, nipple  dishcarge, LAD noted  Plan: Discussed with pt findings.  Given there is no mass on exam and pt is sure she had a MMG last year, I think this is ok to watch.  She knows if the skin erythema does not resolve, she needs to call.  If there are any new breast changes, she needs to call.  I would proceed with diagnostic imaging at that time.  Pt comfortable with plan and will give update in two weeks.  She does need BMD scheduled.  Order placed.  She will call and schedule this on her own.   ~15 minutes spent with patient >50% of time was in face to face discussion of above.

## 2018-06-03 NOTE — Patient Instructions (Signed)
Dr. Lois [facility] - [facility]  Address  [phone], [place] [address]  Get Directions  Appointments [phone]

## 2018-06-07 DIAGNOSIS — J3081 Allergic rhinitis due to animal (cat) (dog) hair and dander: Secondary | ICD-10-CM | POA: Diagnosis not present

## 2018-06-07 DIAGNOSIS — J3089 Other allergic rhinitis: Secondary | ICD-10-CM | POA: Diagnosis not present

## 2018-06-07 DIAGNOSIS — J301 Allergic rhinitis due to pollen: Secondary | ICD-10-CM | POA: Diagnosis not present

## 2018-06-10 DIAGNOSIS — J3089 Other allergic rhinitis: Secondary | ICD-10-CM | POA: Diagnosis not present

## 2018-06-10 DIAGNOSIS — J3081 Allergic rhinitis due to animal (cat) (dog) hair and dander: Secondary | ICD-10-CM | POA: Diagnosis not present

## 2018-06-10 DIAGNOSIS — J301 Allergic rhinitis due to pollen: Secondary | ICD-10-CM | POA: Diagnosis not present

## 2018-06-15 DIAGNOSIS — J3089 Other allergic rhinitis: Secondary | ICD-10-CM | POA: Diagnosis not present

## 2018-06-15 DIAGNOSIS — J301 Allergic rhinitis due to pollen: Secondary | ICD-10-CM | POA: Diagnosis not present

## 2018-06-15 DIAGNOSIS — J3081 Allergic rhinitis due to animal (cat) (dog) hair and dander: Secondary | ICD-10-CM | POA: Diagnosis not present

## 2018-06-17 DIAGNOSIS — J3081 Allergic rhinitis due to animal (cat) (dog) hair and dander: Secondary | ICD-10-CM | POA: Diagnosis not present

## 2018-06-17 DIAGNOSIS — J301 Allergic rhinitis due to pollen: Secondary | ICD-10-CM | POA: Diagnosis not present

## 2018-06-17 DIAGNOSIS — J3089 Other allergic rhinitis: Secondary | ICD-10-CM | POA: Diagnosis not present

## 2018-06-28 DIAGNOSIS — J3089 Other allergic rhinitis: Secondary | ICD-10-CM | POA: Diagnosis not present

## 2018-06-28 DIAGNOSIS — J3081 Allergic rhinitis due to animal (cat) (dog) hair and dander: Secondary | ICD-10-CM | POA: Diagnosis not present

## 2018-06-28 DIAGNOSIS — J301 Allergic rhinitis due to pollen: Secondary | ICD-10-CM | POA: Diagnosis not present

## 2018-06-30 DIAGNOSIS — J3081 Allergic rhinitis due to animal (cat) (dog) hair and dander: Secondary | ICD-10-CM | POA: Diagnosis not present

## 2018-06-30 DIAGNOSIS — J301 Allergic rhinitis due to pollen: Secondary | ICD-10-CM | POA: Diagnosis not present

## 2018-06-30 DIAGNOSIS — J3089 Other allergic rhinitis: Secondary | ICD-10-CM | POA: Diagnosis not present

## 2018-07-05 DIAGNOSIS — J3089 Other allergic rhinitis: Secondary | ICD-10-CM | POA: Diagnosis not present

## 2018-07-05 DIAGNOSIS — J3081 Allergic rhinitis due to animal (cat) (dog) hair and dander: Secondary | ICD-10-CM | POA: Diagnosis not present

## 2018-07-05 DIAGNOSIS — J301 Allergic rhinitis due to pollen: Secondary | ICD-10-CM | POA: Diagnosis not present

## 2018-07-15 DIAGNOSIS — J301 Allergic rhinitis due to pollen: Secondary | ICD-10-CM | POA: Diagnosis not present

## 2018-07-15 DIAGNOSIS — J3089 Other allergic rhinitis: Secondary | ICD-10-CM | POA: Diagnosis not present

## 2018-07-15 DIAGNOSIS — J3081 Allergic rhinitis due to animal (cat) (dog) hair and dander: Secondary | ICD-10-CM | POA: Diagnosis not present

## 2018-07-19 DIAGNOSIS — L661 Lichen planopilaris: Secondary | ICD-10-CM | POA: Diagnosis not present

## 2018-07-19 DIAGNOSIS — E559 Vitamin D deficiency, unspecified: Secondary | ICD-10-CM | POA: Diagnosis not present

## 2018-07-19 DIAGNOSIS — L669 Cicatricial alopecia, unspecified: Secondary | ICD-10-CM | POA: Diagnosis not present

## 2018-07-19 DIAGNOSIS — L659 Nonscarring hair loss, unspecified: Secondary | ICD-10-CM | POA: Diagnosis not present

## 2018-07-19 DIAGNOSIS — J3081 Allergic rhinitis due to animal (cat) (dog) hair and dander: Secondary | ICD-10-CM | POA: Diagnosis not present

## 2018-07-19 DIAGNOSIS — E785 Hyperlipidemia, unspecified: Secondary | ICD-10-CM | POA: Diagnosis not present

## 2018-07-19 DIAGNOSIS — Z23 Encounter for immunization: Secondary | ICD-10-CM | POA: Diagnosis not present

## 2018-07-19 DIAGNOSIS — J301 Allergic rhinitis due to pollen: Secondary | ICD-10-CM | POA: Diagnosis not present

## 2018-07-19 DIAGNOSIS — L239 Allergic contact dermatitis, unspecified cause: Secondary | ICD-10-CM | POA: Diagnosis not present

## 2018-07-19 DIAGNOSIS — R109 Unspecified abdominal pain: Secondary | ICD-10-CM | POA: Diagnosis not present

## 2018-07-19 DIAGNOSIS — R7309 Other abnormal glucose: Secondary | ICD-10-CM | POA: Diagnosis not present

## 2018-07-19 DIAGNOSIS — J3089 Other allergic rhinitis: Secondary | ICD-10-CM | POA: Diagnosis not present

## 2018-07-21 DIAGNOSIS — J301 Allergic rhinitis due to pollen: Secondary | ICD-10-CM | POA: Diagnosis not present

## 2018-07-21 DIAGNOSIS — J3089 Other allergic rhinitis: Secondary | ICD-10-CM | POA: Diagnosis not present

## 2018-07-21 DIAGNOSIS — J3081 Allergic rhinitis due to animal (cat) (dog) hair and dander: Secondary | ICD-10-CM | POA: Diagnosis not present

## 2018-07-27 DIAGNOSIS — J3089 Other allergic rhinitis: Secondary | ICD-10-CM | POA: Diagnosis not present

## 2018-07-27 DIAGNOSIS — J301 Allergic rhinitis due to pollen: Secondary | ICD-10-CM | POA: Diagnosis not present

## 2018-07-27 DIAGNOSIS — J3081 Allergic rhinitis due to animal (cat) (dog) hair and dander: Secondary | ICD-10-CM | POA: Diagnosis not present

## 2018-07-29 DIAGNOSIS — J301 Allergic rhinitis due to pollen: Secondary | ICD-10-CM | POA: Diagnosis not present

## 2018-07-29 DIAGNOSIS — J3089 Other allergic rhinitis: Secondary | ICD-10-CM | POA: Diagnosis not present

## 2018-07-29 DIAGNOSIS — J3081 Allergic rhinitis due to animal (cat) (dog) hair and dander: Secondary | ICD-10-CM | POA: Diagnosis not present

## 2018-08-02 DIAGNOSIS — J3089 Other allergic rhinitis: Secondary | ICD-10-CM | POA: Diagnosis not present

## 2018-08-02 DIAGNOSIS — J301 Allergic rhinitis due to pollen: Secondary | ICD-10-CM | POA: Diagnosis not present

## 2018-08-02 DIAGNOSIS — J3081 Allergic rhinitis due to animal (cat) (dog) hair and dander: Secondary | ICD-10-CM | POA: Diagnosis not present

## 2018-08-05 DIAGNOSIS — J3081 Allergic rhinitis due to animal (cat) (dog) hair and dander: Secondary | ICD-10-CM | POA: Diagnosis not present

## 2018-08-05 DIAGNOSIS — J301 Allergic rhinitis due to pollen: Secondary | ICD-10-CM | POA: Diagnosis not present

## 2018-08-05 DIAGNOSIS — J3089 Other allergic rhinitis: Secondary | ICD-10-CM | POA: Diagnosis not present

## 2018-08-11 DIAGNOSIS — J3081 Allergic rhinitis due to animal (cat) (dog) hair and dander: Secondary | ICD-10-CM | POA: Diagnosis not present

## 2018-08-11 DIAGNOSIS — J3089 Other allergic rhinitis: Secondary | ICD-10-CM | POA: Diagnosis not present

## 2018-08-11 DIAGNOSIS — J301 Allergic rhinitis due to pollen: Secondary | ICD-10-CM | POA: Diagnosis not present

## 2018-08-13 DIAGNOSIS — J3081 Allergic rhinitis due to animal (cat) (dog) hair and dander: Secondary | ICD-10-CM | POA: Diagnosis not present

## 2018-08-13 DIAGNOSIS — J3089 Other allergic rhinitis: Secondary | ICD-10-CM | POA: Diagnosis not present

## 2018-08-13 DIAGNOSIS — J301 Allergic rhinitis due to pollen: Secondary | ICD-10-CM | POA: Diagnosis not present

## 2018-08-16 DIAGNOSIS — J301 Allergic rhinitis due to pollen: Secondary | ICD-10-CM | POA: Diagnosis not present

## 2018-08-16 DIAGNOSIS — J3081 Allergic rhinitis due to animal (cat) (dog) hair and dander: Secondary | ICD-10-CM | POA: Diagnosis not present

## 2018-08-16 DIAGNOSIS — J3089 Other allergic rhinitis: Secondary | ICD-10-CM | POA: Diagnosis not present

## 2018-08-23 DIAGNOSIS — J3089 Other allergic rhinitis: Secondary | ICD-10-CM | POA: Diagnosis not present

## 2018-08-23 DIAGNOSIS — J301 Allergic rhinitis due to pollen: Secondary | ICD-10-CM | POA: Diagnosis not present

## 2018-08-23 DIAGNOSIS — J3081 Allergic rhinitis due to animal (cat) (dog) hair and dander: Secondary | ICD-10-CM | POA: Diagnosis not present

## 2018-08-31 DIAGNOSIS — J3089 Other allergic rhinitis: Secondary | ICD-10-CM | POA: Diagnosis not present

## 2018-08-31 DIAGNOSIS — J3081 Allergic rhinitis due to animal (cat) (dog) hair and dander: Secondary | ICD-10-CM | POA: Diagnosis not present

## 2018-08-31 DIAGNOSIS — J301 Allergic rhinitis due to pollen: Secondary | ICD-10-CM | POA: Diagnosis not present

## 2018-09-01 ENCOUNTER — Other Ambulatory Visit: Payer: 59

## 2018-09-07 DIAGNOSIS — J301 Allergic rhinitis due to pollen: Secondary | ICD-10-CM | POA: Diagnosis not present

## 2018-09-07 DIAGNOSIS — J3081 Allergic rhinitis due to animal (cat) (dog) hair and dander: Secondary | ICD-10-CM | POA: Diagnosis not present

## 2018-09-07 DIAGNOSIS — J3089 Other allergic rhinitis: Secondary | ICD-10-CM | POA: Diagnosis not present

## 2018-09-13 DIAGNOSIS — J3081 Allergic rhinitis due to animal (cat) (dog) hair and dander: Secondary | ICD-10-CM | POA: Diagnosis not present

## 2018-09-13 DIAGNOSIS — J301 Allergic rhinitis due to pollen: Secondary | ICD-10-CM | POA: Diagnosis not present

## 2018-09-13 DIAGNOSIS — J4520 Mild intermittent asthma, uncomplicated: Secondary | ICD-10-CM | POA: Diagnosis not present

## 2018-09-13 DIAGNOSIS — J3089 Other allergic rhinitis: Secondary | ICD-10-CM | POA: Diagnosis not present

## 2018-09-21 DIAGNOSIS — J3089 Other allergic rhinitis: Secondary | ICD-10-CM | POA: Diagnosis not present

## 2018-09-21 DIAGNOSIS — J3081 Allergic rhinitis due to animal (cat) (dog) hair and dander: Secondary | ICD-10-CM | POA: Diagnosis not present

## 2018-09-21 DIAGNOSIS — J301 Allergic rhinitis due to pollen: Secondary | ICD-10-CM | POA: Diagnosis not present

## 2018-09-23 DIAGNOSIS — J301 Allergic rhinitis due to pollen: Secondary | ICD-10-CM | POA: Diagnosis not present

## 2018-09-23 DIAGNOSIS — J3089 Other allergic rhinitis: Secondary | ICD-10-CM | POA: Diagnosis not present

## 2018-09-23 DIAGNOSIS — J3081 Allergic rhinitis due to animal (cat) (dog) hair and dander: Secondary | ICD-10-CM | POA: Diagnosis not present

## 2018-10-04 ENCOUNTER — Ambulatory Visit: Payer: Commercial Managed Care - PPO | Admitting: Obstetrics & Gynecology

## 2018-11-01 ENCOUNTER — Ambulatory Visit
Admission: RE | Admit: 2018-11-01 | Discharge: 2018-11-01 | Disposition: A | Discharge status: Home / Self Care | Payer: Commercial Managed Care - PPO | Source: Ambulatory Visit | Attending: Obstetrics & Gynecology | Admitting: Obstetrics & Gynecology

## 2018-11-01 ENCOUNTER — Other Ambulatory Visit: Payer: Self-pay

## 2018-11-01 DIAGNOSIS — E2839 Other primary ovarian failure: Secondary | ICD-10-CM

## 2019-01-25 ENCOUNTER — Telehealth: Payer: Self-pay | Admitting: Obstetrics & Gynecology

## 2019-01-25 NOTE — Telephone Encounter (Signed)
Patient says she is returning Reina's call.

## 2019-01-26 ENCOUNTER — Other Ambulatory Visit: Payer: Self-pay | Admitting: Obstetrics & Gynecology

## 2019-01-26 ENCOUNTER — Other Ambulatory Visit (HOSPITAL_COMMUNITY)
Admission: RE | Admit: 2019-01-26 | Discharge: 2019-01-26 | Disposition: A | Discharge status: Home / Self Care | Payer: Commercial Managed Care - PPO | Source: Ambulatory Visit | Attending: Obstetrics & Gynecology | Admitting: Obstetrics & Gynecology

## 2019-01-26 ENCOUNTER — Ambulatory Visit (INDEPENDENT_AMBULATORY_CARE_PROVIDER_SITE_OTHER): Payer: Commercial Managed Care - PPO | Admitting: Obstetrics & Gynecology

## 2019-01-26 ENCOUNTER — Other Ambulatory Visit: Payer: Self-pay

## 2019-01-26 ENCOUNTER — Telehealth: Payer: Self-pay | Admitting: Obstetrics & Gynecology

## 2019-01-26 ENCOUNTER — Encounter: Payer: Self-pay | Admitting: Obstetrics & Gynecology

## 2019-01-26 VITALS — BP 138/70 | HR 72 | Temp 97.3°F | Ht 63.0 in | Wt 160.0 lb

## 2019-01-26 DIAGNOSIS — Z01419 Encounter for gynecological examination (general) (routine) without abnormal findings: Secondary | ICD-10-CM

## 2019-01-26 DIAGNOSIS — Z124 Encounter for screening for malignant neoplasm of cervix: Secondary | ICD-10-CM

## 2019-01-26 DIAGNOSIS — Z1231 Encounter for screening mammogram for malignant neoplasm of breast: Secondary | ICD-10-CM

## 2019-01-26 DIAGNOSIS — D219 Benign neoplasm of connective and other soft tissue, unspecified: Secondary | ICD-10-CM

## 2019-01-26 MED ORDER — PROGESTERONE MICRONIZED 200 MG PO CAPS: 200.0000 mg | ORAL_CAPSULE | Freq: Every day | ORAL | 4 refills | Status: DC

## 2019-01-26 MED ORDER — ESTRADIOL 1 MG PO TABS: 1.0000 mg | ORAL_TABLET | Freq: Every day | ORAL | 4 refills | Status: DC

## 2019-01-26 NOTE — Telephone Encounter (Signed)
Call placed to patient to review benefit for scheduled ultrasound appointment. Left voicemail message requesting a return call °

## 2019-01-26 NOTE — Progress Notes (Signed)
58 y.o. G69P1001 Married White or Caucasian female here for annual exam.  Reports she is going well.  Abdominal pain has been much better the past few months.  Denies vaginal bleeding.  Husband lost job so they will not have insurance in November.    She has been diagnosed lichen plano-pilaris.  She has been seeing Dr. Renda Rolls.  She's had two treatment and will have two more treatments.  She is also on finasteride.    Patient's last menstrual period was 02/23/2012.          Sexually active: No.  The current method of family planning is post menopausal status.    Exercising: No.   Smoker:  no  Health Maintenance: Pap:  09/2017 Neg  History of abnormal Pap:  no MMG:  2019 normal  Colonoscopy:  01/01/16 f/u 5 years  BMD: 11/01/18 Normal   TDaP:  unsure Pneumonia vaccine(s):  n/a Shingrix:   Had 1 Hep C testing: done  Screening Labs: had appt in July   reports that she quit smoking about 14 years ago. Her smoking use included cigarettes. She has a 10.00 pack-year smoking history. She has never used smokeless tobacco. She reports that she does not drink alcohol or use drugs.  Past Medical History:  Diagnosis Date  . Allergy   . Dyspnea   . Endometriosis   . Hypertension   . IBS (irritable bowel syndrome)   . Skin cancer, basal cell     Past Surgical History:  Procedure Laterality Date  . CESAREAN SECTION  1983  . CHOLECYSTECTOMY  1990   In [place]  . CYSTOURETHROSCOPY  2016  . HYSTEROSCOPY  2015  . LAPAROSCOPY  03/12/2016   with resection of pelvic adhesions  . LAPAROTOMY  1990   post operative infection from cholecystectomy  . OOPHORECTOMY Left 2000    Current Outpatient Medications  Medication Sig Dispense Refill  . B Complex-C-Folic Acid TABS     . Biotin (BIOTIN 5000) 5 MG CAPS     . Boswellia-Glucosamine-Vit D (OSTEO BI-FLEX ONE PER DAY PO)     . Cholecalciferol (VITAMIN D3) 50 MCG (2000 UT) TABS     . clobetasol (TEMOVATE) 0.05 % external solution APP EXT TO THE  SCALP BID FOR 30 DAYS THEN STOP  0  . Coenzyme Q10 (COQ-10) 100 MG CAPS     . estradiol (ESTRACE) 1 MG tablet Take 1 tablet (1 mg total) by mouth daily with lunch. 90 tablet 2  . Evening Primrose Oil 1000 MG CAPS     . finasteride (PROSCAR) 5 MG tablet TK 1 T PO  QD.    . hyoscyamine (LEVBID) 0.375 MG 12 hr tablet at bedtime.  0  . ibuprofen (ADVIL,MOTRIN) 200 MG tablet Take 200-600 mg by mouth 2 (two) times daily as needed (pain).     . methylPREDNISolone acetate (DEPO-MEDROL) 80 MG/ML injection Depo-Medrol 80 mg/mL suspension for injection  Take 80 mg every day by injection route for 1 day.    . Multiple Vitamin (MULTIVITAMIN WITH MINERALS) TABS tablet Take 1 tablet by mouth at bedtime.     . progesterone (PROMETRIUM) 200 MG capsule Take 1 capsule by mouth daily.  12  . tacrolimus (PROTOPIC) 0.1 % ointment     . vitamin E 400 UNIT capsule Take 400 Units by mouth daily with lunch.    . EPINEPHrine (EPIPEN 2-PAK) 0.3 mg/0.3 mL IJ SOAJ injection Inject 0.3 mg into the muscle once as needed (severe allergic reaction).  Current Facility-Administered Medications  Medication Dose Route Frequency Provider Last Rate Last Dose  . 0.9 %  sodium chloride infusion  500 mL Intravenous Continuous Ladene Artist, MD        Family History  Problem Relation Age of Onset  . Heart disease Father   . Liver disease Father   . Diabetes Mother   . Colon cancer Neg Hx     Review of Systems  All other systems reviewed and are negative.   Exam:   BP 138/70   Pulse 72   Temp (!) 97.3 F (36.3 C) (Temporal)   Ht 5\' 3"  (1.6 m)   Wt 160 lb (72.6 kg)   LMP 02/23/2012   BMI 28.34 kg/m   Height: 5\' 3"  (160 cm)  Ht Readings from Last 3 Encounters:  01/26/19 5\' 3"  (1.6 m)  06/03/18 5\' 3"  (1.6 m)  04/01/18 5\' 3"  (1.6 m)    General appearance: alert, cooperative and appears stated age Head: Normocephalic, without obvious abnormality, atraumatic Neck: no adenopathy, supple, symmetrical, trachea  midline and thyroid normal to inspection and palpation Lungs: clear to auscultation bilaterally Breasts: normal appearance, no masses or tenderness Heart: regular rate and rhythm Abdomen: soft, non-tender; bowel sounds normal; no masses,  no organomegaly Extremities: extremities normal, atraumatic, no cyanosis or edema Skin: Skin color, texture, turgor normal. No rashes or lesions Lymph nodes: Cervical, supraclavicular, and axillary nodes normal. No abnormal inguinal nodes palpated Neurologic: Grossly normal   Pelvic: External genitalia:  no lesions              Urethra:  normal appearing urethra with no masses, tenderness or lesions              Bartholins and Skenes: normal                 Vagina: normal appearing vagina with normal color and discharge, no lesions              Cervix: no lesions              Pap taken: Yes.   Bimanual Exam:  Uterus:  normal size, contour, position, consistency, mobility, non-tender              Adnexa: normal adnexa and no mass, fullness, tenderness               Rectovaginal: Confirms               Anus:  normal sphincter tone, no lesions  Chaperone was present for exam.  A:  Well Woman with normal exam H/o endometrial polyps x 2, s/p hysteroescopy with resection and D&C H/o laparoscopy 2017 with minimal pelvic adhesions H/o laparoscopic LSO 2000 H/o open cholecystectomy in [place] in 1990 with post op infection with re-exploration H/o HRT use Fibroids  P:   Mammogram guidelines reviewed.  Pt aware she needs to schedule. pap smear with HR HPV obtained today Lab work done this summer with Dr. Orland Mustard Return for PUS Rf for estradiol 1.0mg  and prometrium 200mg  daily.  Due to current management for hair loss I don't think she should make any HRT changes.  Rx for #90/4RF given She already has appt for finishing the Shingrix vaccination return annually or prn

## 2019-01-27 ENCOUNTER — Ambulatory Visit (INDEPENDENT_AMBULATORY_CARE_PROVIDER_SITE_OTHER): Payer: Commercial Managed Care - PPO

## 2019-01-27 ENCOUNTER — Ambulatory Visit (INDEPENDENT_AMBULATORY_CARE_PROVIDER_SITE_OTHER): Payer: Commercial Managed Care - PPO | Admitting: Obstetrics & Gynecology

## 2019-01-27 VITALS — BP 126/80 | HR 80 | Temp 97.1°F | Ht 63.0 in | Wt 161.6 lb

## 2019-01-27 DIAGNOSIS — D219 Benign neoplasm of connective and other soft tissue, unspecified: Secondary | ICD-10-CM

## 2019-01-27 DIAGNOSIS — D251 Intramural leiomyoma of uterus: Secondary | ICD-10-CM

## 2019-01-27 LAB — CYTOLOGY - PAP
Adequacy: ABSENT
Diagnosis: NEGATIVE
HPV: NOT DETECTED

## 2019-01-27 NOTE — Progress Notes (Signed)
58 y.o. G60P1001 Married White or Caucasian female here for pelvic ultrasound due to h/o PMP bleeding and endometrial polyp.  Husband has lost job and they have insurance through November.  Wanted to have repeat ultrasound at this time to ensure there are no changes since prior one about a year ago.  Patient's last menstrual period was 02/23/2012.  Contraception: PMP  Findings:  UTERUS: 8.4 x 4.8 x 3.7cm with 7 x 24mm and 10 x 6.28mm fibroids EMS: 2.71mm ADNEXA: Left ovary: surgically absent       Right ovary: 2.1 x 2.0 x 1.0cm CUL DE SAC: no free fluid  Discussion:  Pictures reviewed.  Left ovary appears normal and PMP.  Endometrium is thin and there is no evidence of polyps.  Fibroids stable.   Assessment:  Intramural fibroids H/o PMP due to endometrial polyps, no bleeding in >1 year  Plan:  AEX 1 year.  Pt knows to call with any new bleeding issues.  ~15 minutes spent with patient >50% of time was in face to face discussion of above.

## 2019-01-27 NOTE — Telephone Encounter (Signed)
Spoke with patient regarding benefit for scheduled ultrasound. Patient acknowledges understanding of information presented. Patient is scheduled today, 01/27/2019 with Dr Sabra Heck. Patient is aware of the appointment date, arrival time and cancellation policy. No further questions. Will close encounter

## 2019-01-28 ENCOUNTER — Ambulatory Visit: Payer: Commercial Managed Care - PPO | Admitting: Obstetrics & Gynecology

## 2019-01-29 ENCOUNTER — Encounter: Payer: Self-pay | Admitting: Obstetrics & Gynecology

## 2019-01-29 DIAGNOSIS — D251 Intramural leiomyoma of uterus: Secondary | ICD-10-CM | POA: Insufficient documentation

## 2019-01-29 HISTORY — DX: Intramural leiomyoma of uterus: D25.1

## 2019-02-22 ENCOUNTER — Encounter

## 2019-02-22 ENCOUNTER — Encounter: Payer: Self-pay | Admitting: Cardiology

## 2019-02-22 ENCOUNTER — Other Ambulatory Visit: Payer: Self-pay

## 2019-02-22 ENCOUNTER — Ambulatory Visit (INDEPENDENT_AMBULATORY_CARE_PROVIDER_SITE_OTHER): Payer: Commercial Managed Care - PPO | Admitting: Cardiology

## 2019-02-22 VITALS — BP 140/82 | HR 82 | Ht 63.0 in | Wt 161.4 lb

## 2019-02-22 DIAGNOSIS — R072 Precordial pain: Secondary | ICD-10-CM | POA: Diagnosis not present

## 2019-02-22 DIAGNOSIS — I10 Essential (primary) hypertension: Secondary | ICD-10-CM

## 2019-02-22 DIAGNOSIS — E785 Hyperlipidemia, unspecified: Secondary | ICD-10-CM

## 2019-02-22 MED ORDER — METOPROLOL TARTRATE 100 MG PO TABS: 100.0000 mg | ORAL_TABLET | Freq: Once | ORAL | 0 refills | Status: DC

## 2019-02-22 NOTE — Patient Instructions (Signed)
Medication Instructions:   Your physician recommends that you continue on your current medications as directed. Please refer to the Current Medication list given to you today.  If you need a refill on your cardiac medications before your next appointment, please call your pharmacy.    Lab work:  IN THE NEAR FUTURE, PRIOR TO YOUR CORONARY CT, PER PROTOCOL.  WE WILL ARRANGE THIS WHEN WE CALL YOU TO SCHEDULE YOUR CORONARY CT.  If you have labs (blood work) drawn today and your tests are completely normal, you will receive your results only by: Marland Kitchen MyChart Message (if you have MyChart) OR . A paper copy in the mail If you have any lab test that is abnormal or we need to change your treatment, we will call you to review the results.    Testing/Procedures:   CORONARY CT--THIS MUST BE SCHEDULED IN October PER DR NELSON FOR THE PTS INSURANCE WILL END AFTER October 2020  Your cardiac CT will be scheduled at one of the below locations:   [facility] [address] [place], [place] [address] [phone]   If scheduled at [facility], please arrive at the [facility] main entrance of [facility] 30-45 minutes prior to test start time. Proceed to the [facility] Radiology Department (first floor) to check-in and test prep.   Please follow these instructions carefully (unless otherwise directed):   On the Night Before the Test: . Be sure to Drink plenty of water. . Do not consume any caffeinated/decaffeinated beverages or chocolate 12 hours prior to your test. . Do not take any antihistamines 12 hours prior to your test.   On the Day of the Test: . Drink plenty of water. Do not drink any water within one hour of the test. . Do not eat any food 4 hours prior to the test. . You may take your regular medications prior to the test.  . Take metoprolol 100 MG (Lopressor) two hours prior to test. . FEMALES- please wear underwire-free bra if available        After the Test: . Drink plenty of water. . After receiving IV contrast, you may experience a mild flushed feeling. This is normal. . On occasion, you may experience a mild rash up to 24 hours after the test. This is not dangerous. If this occurs, you can take Benadryl 25 mg and increase your fluid intake. . If you experience trouble breathing, this can be serious. If it is severe call 911 IMMEDIATELY. If it is mild, please call our office.   Please contact the cardiac imaging nurse navigator should you have any questions/concerns Marchia Bond, RN Navigator Cardiac Imaging [facility] [phone] Office  [phone] Cell     Follow-Up: At [facility], you and your health needs are our priority.  As part of our continuing mission to provide you with exceptional heart care, we have created designated Provider Care Teams.  These Care Teams include your primary Cardiologist (physician) and Advanced Practice Providers (APPs -  Physician Assistants and Nurse Practitioners) who all work together to provide you with the care you need, when you need it. You will need a follow up appointment in 6 months [place].  Please call our office 2 months in advance to schedule this appointment.  You may see DR. NELSON or one of the following Advanced Practice Providers on your designated Care Team:   Lyda Jester, PA-C Dayna Dunn, PA-C . Ermalinda Barrios,  PA-C

## 2019-02-23 NOTE — Progress Notes (Signed)
Cardiology Office Note:    Date:  02/23/2019   ID:  Kristin [name], DOB 12/31/1960, MRN [id number]  PCP:  London Pepper, MD  Cardiologist:  No primary care provider on file.  Electrophysiologist:  None   Referring MD: London Pepper, MD   Chief complaint: Chest pain  History of Present Illness:    Kristin [name] is a 58 y.o. female with a hx of hypertension, hyperlipidemia who is being referred by her primary care physician for concerns of chest pain.  The patient has been experiencing chest pain both at rest and on exertion they felt pressure-like and can appear while she is asleep and wake her up.  They would last up to 30 minutes.  She has known hyperlipidemia that has not been treated.  Most recent lipids HDL 66, triglycerides 137 LDL 164.  She is a former smoker and quit in 2007.  Her family history is not significant for premature coronary artery disease.  Father died at age 59 secondary to sepsis, he had a pacemaker.  Past Medical History:  Diagnosis Date  . Allergy   . Dyspnea   . Elevated blood pressure reading   . Endometriosis   . Hyperlipidemia   . Hypertension   . IBS (irritable bowel syndrome)   . Loss of hair   . Pain, abdominal   . Skin cancer, basal cell   . Vitamin D deficiency     Past Surgical History:  Procedure Laterality Date  . CESAREAN SECTION  1983  . CHOLECYSTECTOMY  1990   In [place]  . CYSTOURETHROSCOPY  2016  . HYSTEROSCOPY  2015  . LAPAROSCOPY  03/12/2016   with resection of pelvic adhesions  . LAPAROTOMY  1990   post operative infection from cholecystectomy  . OOPHORECTOMY Left 2000    Current Medications: Current Meds  Medication Sig  . B Complex-C-Folic Acid TABS   . Biotin (BIOTIN 5000) 5 MG CAPS   . Boswellia-Glucosamine-Vit D (OSTEO BI-FLEX ONE PER DAY PO)   . calcium carbonate (CALCIUM 600) 600 MG TABS tablet Take 600 mg by mouth daily.  . Cholecalciferol (VITAMIN D3) 50 MCG (2000 UT) TABS   . clobetasol (TEMOVATE) 0.05 %  external solution APP EXT TO THE SCALP BID FOR 30 DAYS THEN STOP  . Coenzyme Q10 (COQ-10) 100 MG CAPS   . EPINEPHrine (EPIPEN 2-PAK) 0.3 mg/0.3 mL IJ SOAJ injection Inject 0.3 mg into the muscle once as needed (severe allergic reaction).  Marland Kitchen estradiol (ESTRACE) 1 MG tablet Take 1 tablet (1 mg total) by mouth daily with lunch.  . Evening Primrose Oil 1000 MG CAPS   . finasteride (PROSCAR) 5 MG tablet TK 1 T PO  QD.  . hyoscyamine (LEVBID) 0.375 MG 12 hr tablet at bedtime.  Marland Kitchen ibuprofen (ADVIL,MOTRIN) 200 MG tablet Take 200-600 mg by mouth 2 (two) times daily as needed (pain).   . meclizine (ANTIVERT) 25 MG tablet Take 1/2 to 1 as needed  . methocarbamol (ROBAXIN) 500 MG tablet Take 500-1,000 mg by mouth every 6 (six) hours as needed.  . Multiple Vitamin (MULTIVITAMIN WITH MINERALS) TABS tablet Take 1 tablet by mouth at bedtime.   . progesterone (PROMETRIUM) 200 MG capsule Take 1 capsule (200 mg total) by mouth daily.  . tacrolimus (PROTOPIC) 0.1 % ointment   . vitamin E 400 UNIT capsule Take 400 Units by mouth daily with lunch.   Current Facility-Administered Medications for the 02/22/19 encounter (Office Visit) with Dorothy Spark, MD  Medication  .  0.9 %  sodium chloride infusion     Allergies:   Nickel, Other, Oxybutynin chloride er, Diclofenac sodium, Oxybutynin chloride, and Sporanox [itraconazole]   Social History   Socioeconomic History  . Marital status: Married    Spouse name: Not on file  . Number of children: 1  . Years of education: Not on file  . Highest education level: Not on file  Occupational History  . Not on file  Social Needs  . Financial resource strain: Not on file  . Food insecurity    Worry: Not on file    Inability: Not on file  . Transportation needs    Medical: Not on file    Non-medical: Not on file  Tobacco Use  . Smoking status: Former Smoker    Packs/day: 1.00    Years: 10.00    Pack years: 10.00    Types: Cigarettes    Quit date: 05/19/2004     Years since quitting: 14.7  . Smokeless tobacco: Never Used  Substance and Sexual Activity  . Alcohol use: No  . Drug use: No  . Sexual activity: Not Currently    Birth control/protection: Post-menopausal  Lifestyle  . Physical activity    Days per week: Not on file    Minutes per session: Not on file  . Stress: Not on file  Relationships  . Social Herbalist on phone: Not on file    Gets together: Not on file    Attends religious service: Not on file    Active member of club or organization: Not on file    Attends meetings of clubs or organizations: Not on file    Relationship status: Not on file  Other Topics Concern  . Not on file  Social History Narrative  . Not on file     Family History: The patient's family history includes Diabetes in her mother; Heart disease in her father; Liver disease in her father. There is no history of Colon cancer.  ROS:   Please see the history of present illness.    All other systems reviewed and are negative.  EKGs/Labs/Other Studies Reviewed:    The following studies were reviewed today:  EKG:  EKG is ordered today.  The ekg ordered today demonstrates normal sinus rhythm, normal EKG, personally reviewed.  Recent Labs: No results found for requested labs within last 8760 hours.  Recent Lipid Panel No results found for: CHOL, TRIG, HDL, CHOLHDL, VLDL, LDLCALC, LDLDIRECT  Physical Exam:    VS:  BP 140/82   Pulse 82   Ht 5\' 3"  (1.6 m)   Wt 161 lb 6.4 oz (73.2 kg)   LMP 02/23/2012   SpO2 96%   BMI 28.59 kg/m     Wt Readings from Last 3 Encounters:  02/22/19 161 lb 6.4 oz (73.2 kg)  01/27/19 161 lb 9.6 oz (73.3 kg)  01/26/19 160 lb (72.6 kg)     GEN:  Well nourished, well developed in no acute distress HEENT: Normal NECK: No JVD; No carotid bruits LYMPHATICS: No lymphadenopathy CARDIAC: RRR, no murmurs, rubs, gallops RESPIRATORY:  Clear to auscultation without rales, wheezing or rhonchi  ABDOMEN: Soft,  non-tender, non-distended MUSCULOSKELETAL:  No edema; No deformity  SKIN: Warm and dry NEUROLOGIC:  Alert and oriented x 3 PSYCHIATRIC:  Normal affect   ASSESSMENT:    1. Essential hypertension   2. Precordial pain   3. Hyperlipidemia, unspecified hyperlipidemia type    PLAN:    In order  of problems listed above:  1. Chest pain -with some typical and atypical features, risk factors include previous smoking, hypertension and untreated hyperlipidemia.  We will obtain coronary CTA to evaluate for coronary artery disease and tailor therapy based on results.  In particular to decide how aggressive we want to be with lipid-lowering therapy.  She has never been on any statin before. 2. Hyperlipidemia -as above 3. Hypertension -borderline blood pressure today, no therapy, I will obtain information about LVH on the coronary CTA.   Medication Adjustments/Labs and Tests Ordered: Current medicines are reviewed at length with the patient today.  Concerns regarding medicines are outlined above.  Orders Placed This Encounter  Procedures  . CT CORONARY MORPH W/CTA COR W/SCORE W/CA W/CM &/OR WO/CM  . CT CORONARY FRACTIONAL FLOW RESERVE DATA PREP  . CT CORONARY FRACTIONAL FLOW RESERVE FLUID ANALYSIS  . Basic metabolic panel  . EKG 12-Lead   Meds ordered this encounter  Medications  . metoprolol tartrate (LOPRESSOR) 100 MG tablet    Sig: Take 1 tablet (100 mg total) by mouth once for 1 dose. Take 2 hours prior to your Coronary CT.    Dispense:  1 tablet    Refill:  0    Patient Instructions  Medication Instructions:   Your physician recommends that you continue on your current medications as directed. Please refer to the Current Medication list given to you today.  If you need a refill on your cardiac medications before your next appointment, please call your pharmacy.    Lab work:  IN THE NEAR FUTURE, PRIOR TO YOUR CORONARY CT, PER PROTOCOL.  WE WILL ARRANGE THIS WHEN WE CALL YOU TO  SCHEDULE YOUR CORONARY CT.  If you have labs (blood work) drawn today and your tests are completely normal, you will receive your results only by: Marland Kitchen MyChart Message (if you have MyChart) OR . A paper copy in the mail If you have any lab test that is abnormal or we need to change your treatment, we will call you to review the results.    Testing/Procedures:   CORONARY CT--THIS MUST BE SCHEDULED IN October PER DR Zhara Gieske FOR THE PTS INSURANCE WILL END AFTER October 2020  Your cardiac CT will be scheduled at one of the below locations:   [facility] [address] [place], [place] [address] [phone]   If scheduled at [facility], please arrive at the [facility] main entrance of [facility] 30-45 minutes prior to test start time. Proceed to the [facility] Radiology Department (first floor) to check-in and test prep.   Please follow these instructions carefully (unless otherwise directed):   On the Night Before the Test: . Be sure to Drink plenty of water. . Do not consume any caffeinated/decaffeinated beverages or chocolate 12 hours prior to your test. . Do not take any antihistamines 12 hours prior to your test.   On the Day of the Test: . Drink plenty of water. Do not drink any water within one hour of the test. . Do not eat any food 4 hours prior to the test. . You may take your regular medications prior to the test.  . Take metoprolol 100 MG (Lopressor) two hours prior to test. . FEMALES- please wear underwire-free bra if available       After the Test: . Drink plenty of water. . After receiving IV contrast, you may experience a mild flushed feeling. This is normal. . On occasion, you may  experience a mild rash up to 24 hours after the test. This is not dangerous. If this occurs, you can take Benadryl 25 mg and increase your fluid intake. . If you experience trouble breathing, this can be serious. If it is severe call 911 IMMEDIATELY.  If it is mild, please call our office.   Please contact the cardiac imaging nurse navigator should you have any questions/concerns Marchia Bond, RN Navigator Cardiac Imaging [facility] [phone] Office  [phone] Cell     Follow-Up: At [facility], you and your health needs are our priority.  As part of our continuing mission to provide you with exceptional heart care, we have created designated Provider Care Teams.  These Care Teams include your primary Cardiologist (physician) and Advanced Practice Providers (APPs -  Physician Assistants and Nurse Practitioners) who all work together to provide you with the care you need, when you need it. You will need a follow up appointment in 6 months [place].  Please call our office 2 months in advance to schedule this appointment.  You may see DR. Annaleese Guier or one of the following Advanced Practice Providers on your designated Care Team:   Lyda Jester, PA-C Dayna Dunn, PA-C . Ermalinda Barrios, PA-C       Signed, Ena Dawley, MD  02/23/2019 9:32 PM    [place]

## 2019-03-03 ENCOUNTER — Telehealth: Payer: Self-pay | Admitting: *Deleted

## 2019-03-03 NOTE — Telephone Encounter (Signed)
-----   Message from Armando Gang sent at 03/03/2019  8:41 AM EDT ----- Regarding: RE: CORONARY CT PER DR NELSON TO BE DONE IN OCTOBER DUE TO INSURANCE WILL END Just approved yesterday call and left message/she called me back this morning.  I will call in a few to get her schedule.  ----- Message ----- From: Nuala Alpha, LPN Sent: [id number]   2:51 PM EDT To: Armando Gang Subject: FW: CORONARY CT PER DR NELSON TO BE DONE IN #  Any updates on the status of this pt getting her CT scheduled?  I know her insurance will run out in November, so I told Meda Coffee I would ask.  Thanks for everything you do, Ivy  ----- Message ----- From: Armando Gang Sent: 02/22/2019  10:18 AM EDT To: Nuala Alpha, LPN Subject: RE: CORONARY CT PER DR Meda Coffee TO BE DONE IN #  Thanks.  ----- Message ----- From: Nuala Alpha, LPN Sent: [id number]   9:31 AM EDT To: Armando Gang Subject: CORONARY CT PER DR NELSON TO BE DONE IN OCTO#  PER DR. Meda Coffee, SHE SAW THIS PT TODAY IN CLINIC. SHE ORDERED FOR HER TO HAVE A CORONARY CT DONE, AND ASKED THAT I SEND THIS MESSAGE TO YOU TO MAKE SURE THAT THIS GETS SCHEDULED IN October 2020, FOR HER INSURANCE WILL END AFTER October.  PLEASE SCHEDULE THIS.  BMET AND CORONARY CT ORDER IN.    THANKS, IVY

## 2019-03-03 NOTE — Telephone Encounter (Signed)
Pt is scheduled for her Coronary CT for 10/26 at 0730.  Pt will come in for her lab to check a BMET on 10/20, per CT protocol. Pt made aware of appt dates and times by CT Scheduler.

## 2019-03-08 ENCOUNTER — Other Ambulatory Visit: Payer: Self-pay

## 2019-03-08 ENCOUNTER — Other Ambulatory Visit: Payer: Commercial Managed Care - PPO

## 2019-03-08 DIAGNOSIS — E785 Hyperlipidemia, unspecified: Secondary | ICD-10-CM

## 2019-03-08 DIAGNOSIS — R072 Precordial pain: Secondary | ICD-10-CM

## 2019-03-08 DIAGNOSIS — I10 Essential (primary) hypertension: Secondary | ICD-10-CM

## 2019-03-08 LAB — BASIC METABOLIC PANEL
BUN/Creatinine Ratio: 25 — ABNORMAL HIGH (ref 9–23)
BUN: 17 mg/dL (ref 6–24)
CO2: 24 mmol/L (ref 20–29)
Calcium: 9.5 mg/dL (ref 8.7–10.2)
Chloride: 102 mmol/L (ref 96–106)
Creatinine, Ser: 0.68 mg/dL (ref 0.57–1.00)
GFR calc Af Amer: 112 mL/min/{1.73_m2} (ref >59)
GFR calc non Af Amer: 97 mL/min/{1.73_m2} (ref >59)
Glucose: 82 mg/dL (ref 65–99)
Potassium: 4.5 mmol/L (ref 3.5–5.2)
Sodium: 139 mmol/L (ref 134–144)

## 2019-03-09 ENCOUNTER — Ambulatory Visit
Admission: RE | Admit: 2019-03-09 | Discharge: 2019-03-09 | Disposition: A | Discharge status: Home / Self Care | Payer: Commercial Managed Care - PPO | Source: Ambulatory Visit | Attending: Obstetrics & Gynecology | Admitting: Obstetrics & Gynecology

## 2019-03-09 DIAGNOSIS — Z1231 Encounter for screening mammogram for malignant neoplasm of breast: Secondary | ICD-10-CM

## 2019-03-11 ENCOUNTER — Telehealth (HOSPITAL_COMMUNITY): Payer: Self-pay | Admitting: Emergency Medicine

## 2019-03-11 NOTE — Telephone Encounter (Signed)
Reaching out to patient to offer assistance regarding upcoming cardiac imaging study; pt verbalizes understanding of appt date/time, parking situation and where to check in, pre-test NPO status and medications ordered, and verified current allergies; name and call back number provided for further questions should they arise Trystin Hargrove RN Navigator Cardiac Imaging [facility] [phone] office [phone] cell 

## 2019-03-14 ENCOUNTER — Ambulatory Visit (HOSPITAL_COMMUNITY)
Admission: RE | Admit: 2019-03-14 | Discharge: 2019-03-14 | Disposition: A | Discharge status: Home / Self Care | Payer: Commercial Managed Care - PPO | Source: Ambulatory Visit | Attending: Cardiology | Admitting: Cardiology

## 2019-03-14 ENCOUNTER — Other Ambulatory Visit: Payer: Self-pay

## 2019-03-14 DIAGNOSIS — I10 Essential (primary) hypertension: Secondary | ICD-10-CM

## 2019-03-14 DIAGNOSIS — E785 Hyperlipidemia, unspecified: Secondary | ICD-10-CM | POA: Insufficient documentation

## 2019-03-14 DIAGNOSIS — I2510 Atherosclerotic heart disease of native coronary artery without angina pectoris: Secondary | ICD-10-CM | POA: Insufficient documentation

## 2019-03-14 DIAGNOSIS — R072 Precordial pain: Secondary | ICD-10-CM | POA: Insufficient documentation

## 2019-03-14 MED ORDER — IOHEXOL 350 MG/ML SOLN
100.0000 mL | Freq: Once | INTRAVENOUS | Status: AC | PRN
  Administered 2019-03-14: 100 mL via INTRAVENOUS

## 2019-03-14 MED ORDER — NITROGLYCERIN 0.4 MG SL SUBL
0.8000 mg | SUBLINGUAL_TABLET | SUBLINGUAL | Status: DC | PRN
  Administered 2019-03-14: 09:00:00 0.8 mg via SUBLINGUAL

## 2019-03-14 MED ORDER — NITROGLYCERIN 0.4 MG SL SUBL
SUBLINGUAL_TABLET | SUBLINGUAL | Status: AC
  Filled 2019-03-14: qty 2

## 2019-03-15 ENCOUNTER — Telehealth: Payer: Self-pay | Admitting: *Deleted

## 2019-03-15 ENCOUNTER — Telehealth: Payer: Self-pay | Admitting: Cardiology

## 2019-03-15 DIAGNOSIS — Q211 Atrial septal defect, unspecified: Secondary | ICD-10-CM

## 2019-03-15 DIAGNOSIS — E785 Hyperlipidemia, unspecified: Secondary | ICD-10-CM

## 2019-03-15 MED ORDER — ROSUVASTATIN CALCIUM 5 MG PO TABS: 5.0000 mg | ORAL_TABLET | Freq: Every day | ORAL | 2 refills | Status: DC

## 2019-03-15 NOTE — Telephone Encounter (Signed)
-----   Message from Dorothy Spark, MD sent at 03/15/2019  1:18 PM EDT ----- Her coronary CTA shows no evidence for coronary artery disease whatsoever.  Given h LDL of 164 I will start her on rosuvastatin 5 mg daily and repeat lipids and LFTs in couple months.  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  Echocardiogram is recommended for further evaluation.  If possible please order next few days as I think she is losing insurance at the end of October.  Thank you

## 2019-03-15 NOTE — Telephone Encounter (Signed)
Please refer to previous encounter from today, that was already opened on this pt.

## 2019-03-15 NOTE — Telephone Encounter (Signed)
Left the pt a message to call the office back to endorse Coronary CT results and recommendations per Dr Meda Coffee.

## 2019-03-15 NOTE — Telephone Encounter (Signed)
Spoke with the pt and informed her that per Dr Meda Coffee, her Coronary CT showed no CAD, however given her LDL of 164, she recommends that we start her on rosuvastatin 5 mg po daily, and recheck her lipids and LFTs in 2 months.   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   Confirmed the pharmacy of choice with the pt.  Scheduled the pt for repeat lipids and LFTs in 2 months on 05/16/19 at 0815.  Pt is aware to come fasting to this lab appt. Informed the pt that I will place the order for the echo in the system, and send a message to our Echo [place] [facility], to call her back today and have this appt scheduled for this week.  Pt verbalized understanding and agrees with this plan.

## 2019-03-15 NOTE — Addendum Note (Signed)
Addended by: Nuala Alpha on: 03/15/2019 02:35 PM   Modules accepted: Orders

## 2019-03-15 NOTE — Telephone Encounter (Signed)
Follow up:     Patient returning your call back concering results. Please call patient back.

## 2019-03-15 NOTE — Telephone Encounter (Signed)
Pts echo is scheduled for 03/16/19 at 1015.  Pt made aware of appt date and time by Echo Scheduler.

## 2019-03-16 ENCOUNTER — Other Ambulatory Visit: Payer: Self-pay

## 2019-03-16 ENCOUNTER — Ambulatory Visit (HOSPITAL_COMMUNITY): Payer: Commercial Managed Care - PPO | Attending: Cardiovascular Disease

## 2019-03-16 DIAGNOSIS — Q211 Atrial septal defect, unspecified: Secondary | ICD-10-CM

## 2019-03-18 ENCOUNTER — Telehealth: Payer: Self-pay | Admitting: *Deleted

## 2019-03-18 DIAGNOSIS — Q211 Atrial septal defect, unspecified: Secondary | ICD-10-CM

## 2019-03-18 NOTE — Telephone Encounter (Signed)
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

## 2019-03-18 NOTE — Telephone Encounter (Signed)
Notified the pt that per Dr Meda Coffee, based on her echo results, she did note she has ASD and functionally bicuspid aortic valve, and from here on out, when she reports to the [facility], she will need to call us beforehand, to have prophylactic antibiotics started.  Pt education provided on this.  Informed the pt that other than that, per Dr Meda Coffee, she should continue her current med regimen, and we will follow-up with a repeat echo in one year.  Informed the pt that I will place the order for her echo to be done in one year in the system, and send a message to our Echo [facility], to call her back and have this arranged.  Pt verbalized understanding and agrees with this plan.

## 2019-05-16 ENCOUNTER — Other Ambulatory Visit: Payer: Self-pay | Admitting: *Deleted

## 2019-05-16 ENCOUNTER — Other Ambulatory Visit: Payer: Self-pay

## 2019-05-16 DIAGNOSIS — Q211 Atrial septal defect, unspecified: Secondary | ICD-10-CM

## 2019-05-16 DIAGNOSIS — E785 Hyperlipidemia, unspecified: Secondary | ICD-10-CM

## 2019-05-16 LAB — HEPATIC FUNCTION PANEL
ALT: 35 IU/L — ABNORMAL HIGH (ref 0–32)
AST: 32 IU/L (ref 0–40)
Albumin: 4.8 g/dL (ref 3.8–4.9)
Alkaline Phosphatase: 45 IU/L (ref 39–117)
Bilirubin Total: 0.5 mg/dL (ref 0.0–1.2)
Bilirubin, Direct: 0.11 mg/dL (ref 0.00–0.40)
Total Protein: 6.9 g/dL (ref 6.0–8.5)

## 2019-05-16 LAB — LIPID PANEL
Chol/HDL Ratio: 2.3 ratio (ref 0.0–4.4)
Cholesterol, Total: 177 mg/dL (ref 100–199)
HDL: 77 mg/dL (ref >39)
LDL Chol Calc (NIH): 77 mg/dL (ref 0–99)
Triglycerides: 133 mg/dL (ref 0–149)
VLDL Cholesterol Cal: 23 mg/dL (ref 5–40)

## 2019-07-30 ENCOUNTER — Ambulatory Visit: Payer: Self-pay

## 2019-07-30 ENCOUNTER — Ambulatory Visit: Payer: Self-pay | Attending: Internal Medicine

## 2019-07-30 DIAGNOSIS — Z23 Encounter for immunization: Secondary | ICD-10-CM

## 2019-07-30 NOTE — Progress Notes (Signed)
   Covid-19 Vaccination Clinic  Name:  Kristin [name]    MRN: [id number] DOB: Sep 06, 1960  07/30/2019  [name] was observed post Covid-19 immunization for 15 minutes without incident. She was provided with Vaccine Information Sheet and instruction to access the V-Safe system.   [name] was instructed to call 911 with any severe reactions post vaccine: Marland Kitchen Difficulty breathing  . Swelling of face and throat  . A fast heartbeat  . A bad rash all over body  . Dizziness and weakness   Immunizations Administered    Name Date Dose VIS Date Route   Pfizer COVID-19 Vaccine 07/30/2019 12:50 PM 0.3 mL 04/29/2019 Intramuscular   Manufacturer: [place]   Lot: [id number]   [place]: [id number]

## 2019-08-03 ENCOUNTER — Ambulatory Visit: Payer: Self-pay | Attending: Internal Medicine

## 2019-08-03 DIAGNOSIS — Z20822 Contact with and (suspected) exposure to covid-19: Secondary | ICD-10-CM | POA: Insufficient documentation

## 2019-08-04 LAB — NOVEL CORONAVIRUS, NAA: SARS-CoV-2, NAA: NOT DETECTED

## 2019-08-22 IMAGING — CT CT ABDOMEN W/O CM
1 of 2 series · 14 of 32 positions shown, 19 images · non-contrast
Comparison: CT abdomen pelvis of 03/01/2016

CLINICAL DATA: Followup of adrenal lesion noted in 2944

EXAM:
CT ABDOMEN WITHOUT CONTRAST
TECHNIQUE: Multidetector CT imaging of the abdomen was performed following the
standard protocol without IV contrast.

[Series 2: adrenal w/(date) · axial · 0.74mm/px · z∈[-301,-31]mm · 14 of 100 slices shown, 19 images]
[im 5/100  soft-tissue]
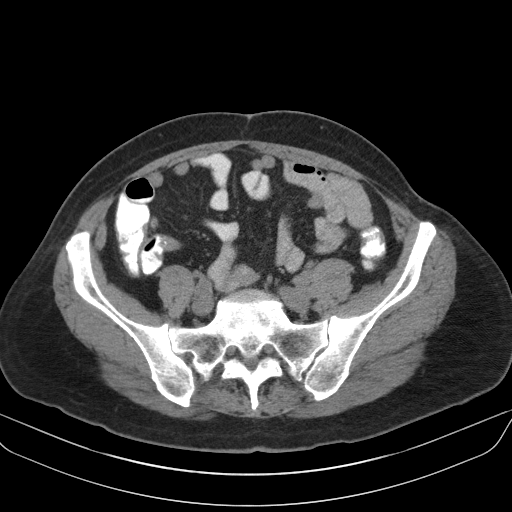
[im 5/100  bone]
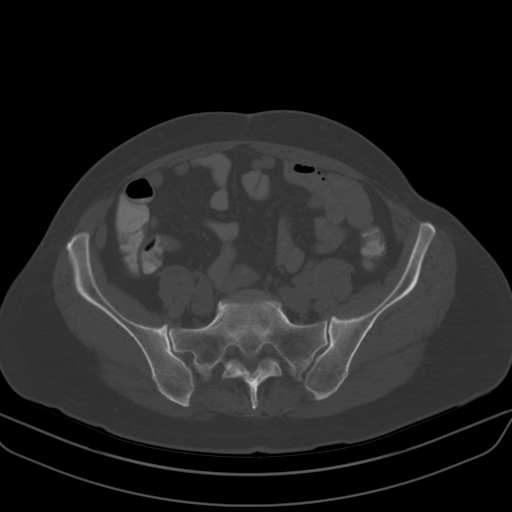
[im 15/100  soft-tissue]
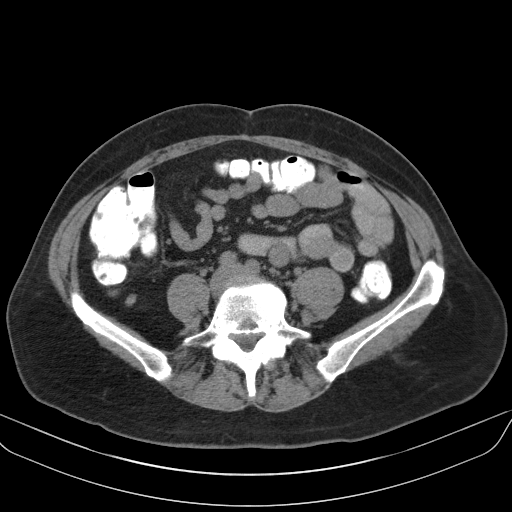
[im 19/100  soft-tissue]
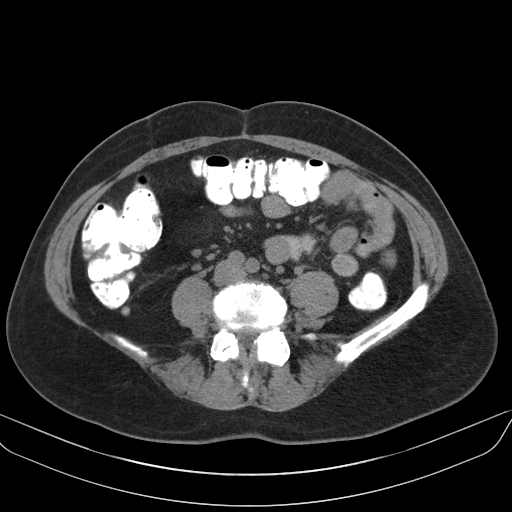
[im 29/100  soft-tissue]
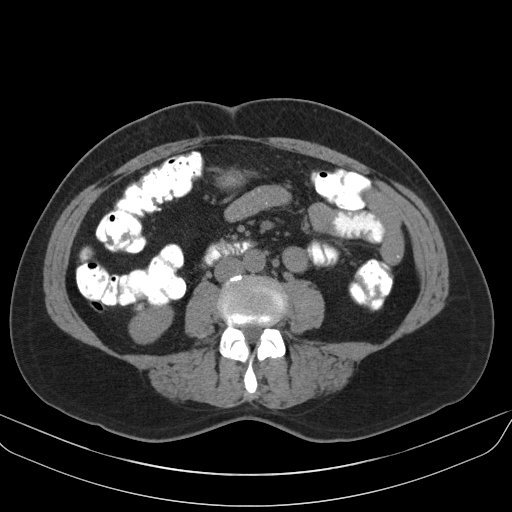
[im 34/100  soft-tissue]
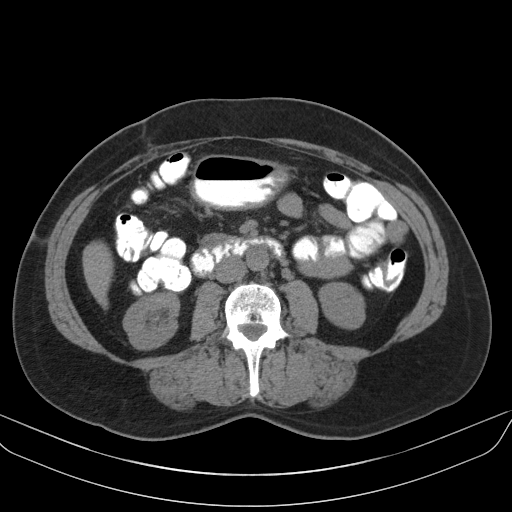
[im 43/100  soft-tissue]
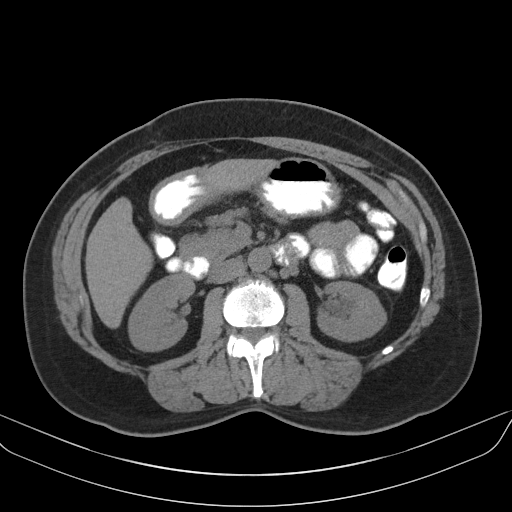
[im 52/100  soft-tissue]
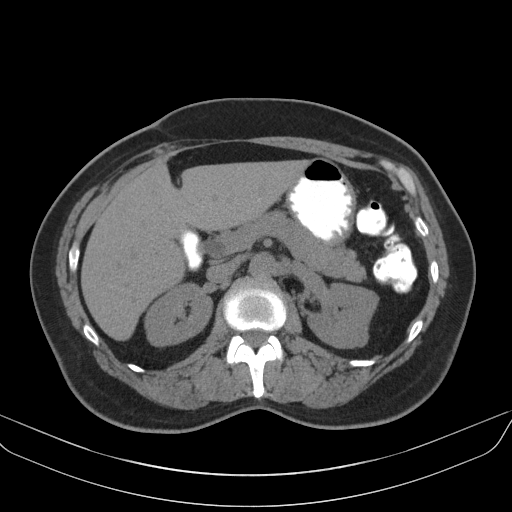
[im 57/100  soft-tissue]
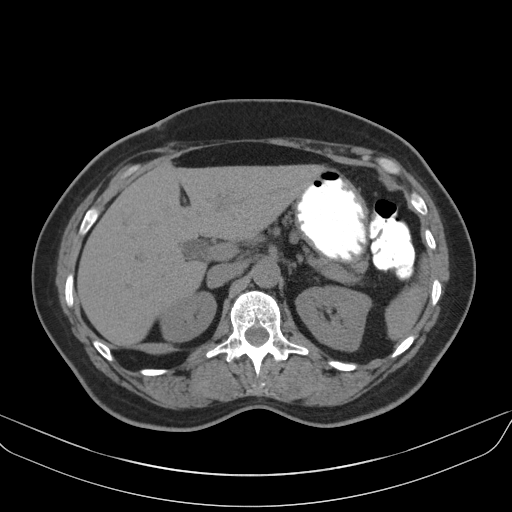
[im 67/100  soft-tissue]
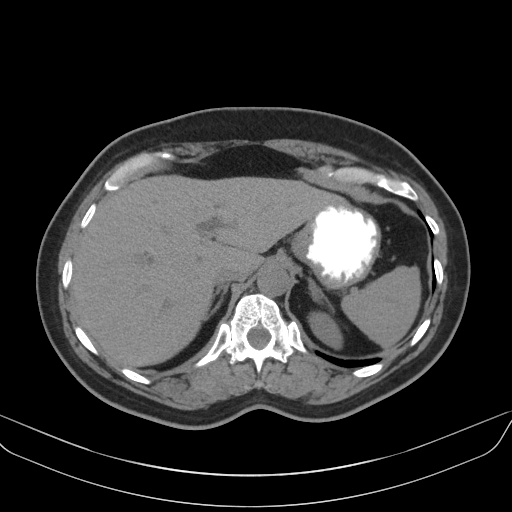
[im 67/100  bone]
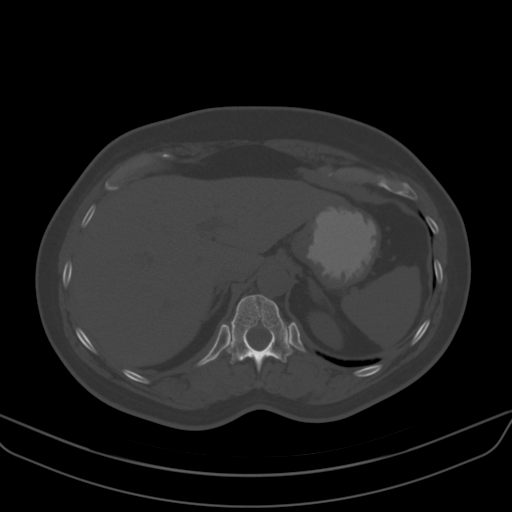
[im 71/100  soft-tissue]
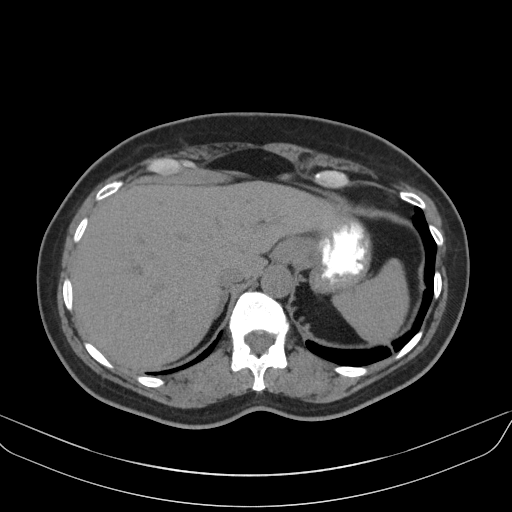
[im 81/100  soft-tissue]
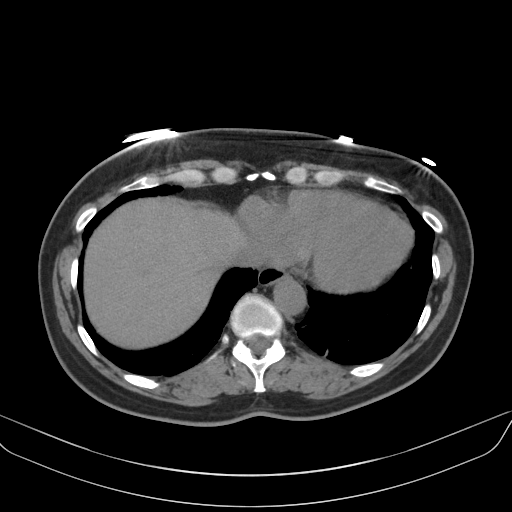
[im 81/100  lung]
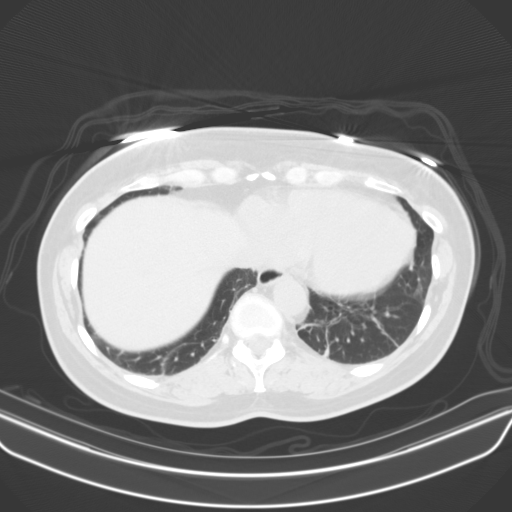
[im 85/100  soft-tissue]
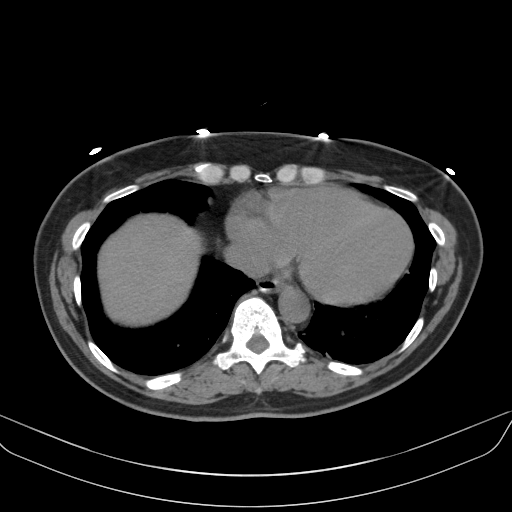
[im 85/100  lung]
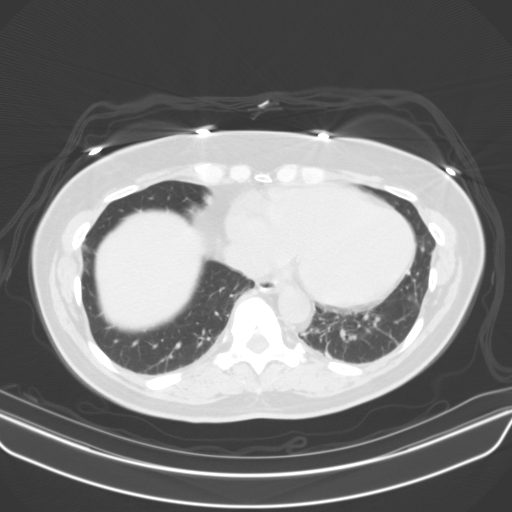
[im 90/100  lung]
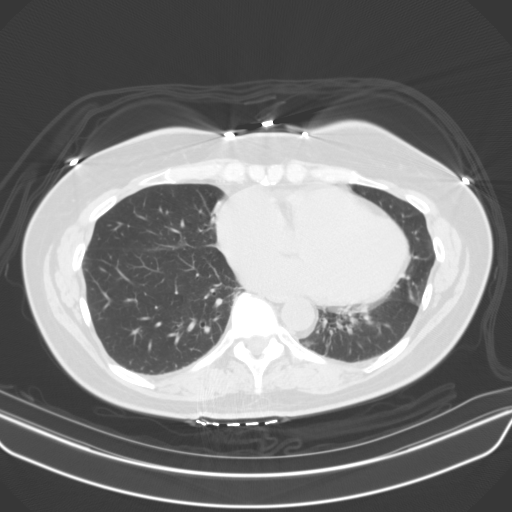
[im 95/100  soft-tissue]
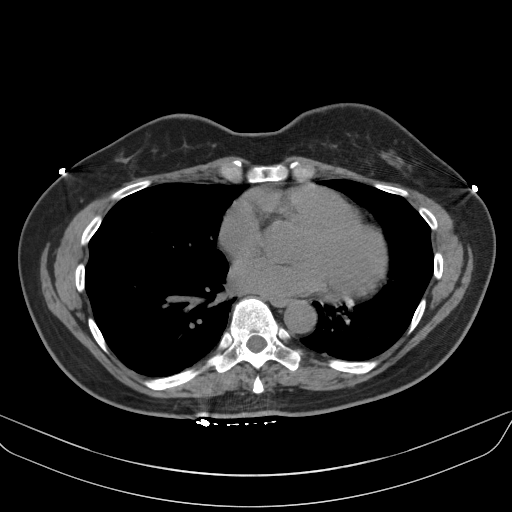
[im 95/100  lung]
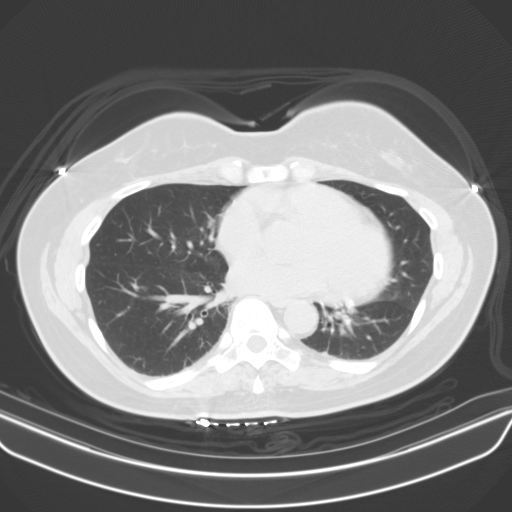

[14 of 32 positions shown; findings below may reference images not displayed]

FINDINGS: Lower chest: There is linear scarring at both lung bases
left-greater-than-right but no pneumonia or effusion is seen.
Cardiomegaly is stable. No pericardial effusion is noted.

Hepatobiliary: The liver is unremarkable in the unenhanced state.
The gallbladder has previously been resected.

Pancreas: On this unenhanced study, the pancreas is unremarkable and
the pancreatic duct is not dilated

Spleen: The spleen appears unchanged and within normal limits.

Adrenals/Urinary Tract: The right adrenal gland is normal. The left
adrenal gland does contain a and adrenal adenoma of approximately 11
mm in diameter with attenuation of -12 HU. This is consistent with
and adrenal adenoma and appears stable. No further followup is
necessary. A tiny nonobstructing left lower pole renal calculus is
present. The ureters appear normal in caliber.

Stomach/Bowel: The stomach is moderately distended with oral
contrast and is unremarkable. No small bowel distention is seen. The
portion of the colon that is visualized does contain oral contrast
media diffusely. There are a few scattered colonic diverticular of
but no colon lesion is seen. The appendix is well seen and is
unremarkable.

Vascular/Lymphatic: The abdominal aorta is normal in caliber. No
adenopathy is seen.

Other: Linear scarring is noted in the right upper quadrant
superficially presumably due to prior surgical intervention during
cholecystectomy.

Musculoskeletal: The lumbar vertebrae are in normal alignment. There
is degenerative disc disease at L5-S1. The SI joints appear
corticated.
IMPRESSION: 1. Stable incidental 11 mm left adrenal adenoma. No further followup
is necessary.
2. Single small nonobstructing left lower pole renal calculus.
3. The appendix is unremarkable.

## 2019-08-30 ENCOUNTER — Ambulatory Visit: Payer: Self-pay | Attending: Internal Medicine

## 2019-08-30 DIAGNOSIS — Z23 Encounter for immunization: Secondary | ICD-10-CM

## 2019-08-30 NOTE — Progress Notes (Signed)
   Covid-19 Vaccination Clinic  Name:  Kristin [name]    MRN: [id number] DOB: 14-Jan-1961  08/30/2019  [name] was observed post Covid-19 immunization for 15 minutes without incident. She was provided with Vaccine Information Sheet and instruction to access the V-Safe system.   [name] was instructed to call 911 with any severe reactions post vaccine: Marland Kitchen Difficulty breathing  . Swelling of face and throat  . A fast heartbeat  . A bad rash all over body  . Dizziness and weakness   Immunizations Administered    Name Date Dose VIS Date Route   Pfizer COVID-19 Vaccine 08/30/2019  4:41 PM 0.3 mL 04/29/2019 Intramuscular   Manufacturer: [place]   Lot: [id number]   [place]: [id number]

## 2019-11-15 ENCOUNTER — Telehealth: Payer: Self-pay | Admitting: Cardiology

## 2019-11-15 DIAGNOSIS — R002 Palpitations: Secondary | ICD-10-CM

## 2019-11-15 MED ORDER — VALSARTAN 80 MG PO TABS: 80.0000 mg | ORAL_TABLET | Freq: Every day | ORAL | 1 refills | Status: DC

## 2019-11-15 NOTE — Telephone Encounter (Signed)
Pt is calling in to let Dr. Meda Coffee know that over the last week, when she wakes up, she feels very fatigued, she has mild chest discomfort on the left side of her chest, but states its "not pain." She states she feels like her heart is racing, but has no values.  She states she walks her dog daily, and can complete this task without getting sob on exertion or without having exertional chest pain.  She states after the walk she feels really tired though. She states she wakes up in the morning and her eyes are puffy sometimes.  She took her BP this morning and it was 142/82, no HR.  Pt states she is taking all her meds.  Pt states she has no sob, doe, dizziness, lightheadedness, N/V, diaphoresis, pre-syncopal or syncopal episodes.  Pt states she does not have health insurance until November 17, 2019.  Pt would like for Dr. Meda Coffee to further advise. She is having no active complaints at this time.  Informed the pt that I will route this message to Dr. Meda Coffee to further review and advise on, and follow-up with the pt thereafter.  Pt verbalized understanding and agrees with this plan. Tried to fine an APP appt, and there is no availability until late July.

## 2019-11-15 NOTE — Telephone Encounter (Signed)
She was hypertensive on her last visit as well, I would start her on valsartan 80 mg po daily and start 1 week zio patch monitor for palpitations.

## 2019-11-15 NOTE — Telephone Encounter (Signed)
Spoke with the pt and informed her that per Dr. Meda Coffee, she recommends that she start taking Valsartan 80 mg po daily, due to being hypertensive at last OV and her BP today.  Also informed the pt that we will order for her to have a one week zio monitor for palpitations.  Informed the pt that I will send a message to our monitor techs to mail this to her home address.  Confirmed the pharmacy of choice. Pt verbalized understanding and agrees with this plan.

## 2019-11-15 NOTE — Telephone Encounter (Signed)
     Pt said she's not been feeling good for a week now, every morning when she wakes up her eyes is puffy, she can't sleep good at night and can't lay on her left side as much, she always wakes up feeling tired. She also said she feels like her heart beats fast. She has not check her BP or HR yet.  Please advise

## 2019-11-17 ENCOUNTER — Telehealth: Payer: Self-pay | Admitting: *Deleted

## 2019-11-17 NOTE — Telephone Encounter (Signed)
Patient enrolled for [employer] to ship a 7 day ZIO XT long term holter monitor to her home.  New insurance as of 11/17/2019 [employer] member # [id number] Group # [id number], subscriber spouse Kristin [name] DOB 04/04[birth year]66.

## 2019-11-22 ENCOUNTER — Other Ambulatory Visit (INDEPENDENT_AMBULATORY_CARE_PROVIDER_SITE_OTHER): Payer: Self-pay

## 2019-11-22 DIAGNOSIS — R002 Palpitations: Secondary | ICD-10-CM

## 2019-11-22 NOTE — Telephone Encounter (Signed)
RE: zio monitor per Meda Coffee Received: 5 days ago Seleta Rhymes, LPN 1/0/30 patient enrolled for [employer] to ship a 7 day ZIO XT long term holter monitor to her home.  Thanks,  [employer]

## 2019-12-14 ENCOUNTER — Telehealth: Payer: Self-pay | Admitting: Cardiology

## 2019-12-14 NOTE — Telephone Encounter (Signed)
New Message  Patient is calling in for her monitor results. Please give patient a call back.

## 2019-12-14 NOTE — Telephone Encounter (Signed)
LMTCB

## 2019-12-15 NOTE — Telephone Encounter (Signed)
Message sent below from monitor tech, in regards to pts monitor results:  [employer] has received the monitor and are processing it. I will request it be expedited so we should have results with 24 hours.

## 2019-12-15 NOTE — Telephone Encounter (Signed)
Kristin [name] is returning Ann's call.

## 2019-12-15 NOTE — Telephone Encounter (Signed)
Spoke with the pt and informed her that her monitor results have not been imported into Dr. Francesca Oman in-basket to review yet. Informed her that the results are still in process.  Informed her that I will send a message to our [place] to call the monitor company and inquire the status of her monitor results. Informed the pt that once the results come back, we will follow-up with her shortly thereafter. Pt verbalized understanding and agrees with this plan.

## 2019-12-26 NOTE — Telephone Encounter (Signed)
Thompson Grayer, RN  12/23/2019 8:29 AM EDT Back to Top    The patient has been notified of the result and verbalized understanding. All questions (if any) were answered. Leodis Liverpool, RN 12/23/2019 8:29 AM     Dorothy Spark, MD  12/16/2019 12:35 PM EDT     Predominant underlying rhythm was Sinus Rhythm.  3 very short Supraventricular Tachycardia runs occurred. No significant arrhythmias were identified.  Patient's symptoms didn't correlate with any arrhythmias.  Continue current management.

## 2019-12-29 ENCOUNTER — Other Ambulatory Visit: Payer: Self-pay | Admitting: Cardiology

## 2019-12-29 DIAGNOSIS — Q211 Atrial septal defect, unspecified: Secondary | ICD-10-CM

## 2019-12-29 DIAGNOSIS — E785 Hyperlipidemia, unspecified: Secondary | ICD-10-CM

## 2020-01-22 ENCOUNTER — Emergency Department (HOSPITAL_COMMUNITY)
Admission: EM | Admit: 2020-01-22 | Discharge: 2020-01-23 | Disposition: A | Discharge status: Home / Self Care | Payer: BLUE CROSS/BLUE SHIELD | Attending: Emergency Medicine | Admitting: Emergency Medicine

## 2020-01-22 ENCOUNTER — Encounter (HOSPITAL_COMMUNITY): Payer: Self-pay | Admitting: Emergency Medicine

## 2020-01-22 ENCOUNTER — Other Ambulatory Visit: Payer: Self-pay

## 2020-01-22 DIAGNOSIS — I10 Essential (primary) hypertension: Secondary | ICD-10-CM | POA: Diagnosis not present

## 2020-01-22 DIAGNOSIS — Z85828 Personal history of other malignant neoplasm of skin: Secondary | ICD-10-CM | POA: Insufficient documentation

## 2020-01-22 DIAGNOSIS — Z87891 Personal history of nicotine dependence: Secondary | ICD-10-CM | POA: Diagnosis not present

## 2020-01-22 DIAGNOSIS — R519 Headache, unspecified: Secondary | ICD-10-CM | POA: Insufficient documentation

## 2020-01-22 DIAGNOSIS — Z79899 Other long term (current) drug therapy: Secondary | ICD-10-CM | POA: Diagnosis not present

## 2020-01-22 NOTE — ED Triage Notes (Signed)
Pt reports she saw her PCP on Wednesday, c/o hypertension and headache x 4 days. States she was seen at urgent care yesterday and given muscle relaxer with no relief. A&O x 4, ambulatory without difficulty.

## 2020-01-23 ENCOUNTER — Emergency Department (HOSPITAL_COMMUNITY): Payer: BLUE CROSS/BLUE SHIELD

## 2020-01-23 LAB — CBC WITH DIFFERENTIAL/PLATELET
Abs Immature Granulocytes: 0.03 10*3/uL (ref 0.00–0.07)
Basophils Absolute: 0.1 10*3/uL (ref 0.0–0.1)
Basophils Relative: 1 %
Eosinophils Absolute: 0 10*3/uL (ref 0.0–0.5)
Eosinophils Relative: 0 %
HCT: 41.6 % (ref 36.0–46.0)
Hemoglobin: 13.8 g/dL (ref 12.0–15.0)
Immature Granulocytes: 0 %
Lymphocytes Relative: 26 %
Lymphs Abs: 2.3 10*3/uL (ref 0.7–4.0)
MCH: 30.7 pg (ref 26.0–34.0)
MCHC: 33.2 g/dL (ref 30.0–36.0)
MCV: 92.7 fL (ref 80.0–100.0)
Monocytes Absolute: 0.7 10*3/uL (ref 0.1–1.0)
Monocytes Relative: 7 %
Neutro Abs: 5.8 10*3/uL (ref 1.7–7.7)
Neutrophils Relative %: 66 %
Platelets: 313 10*3/uL (ref 150–400)
RBC: 4.49 MIL/uL (ref 3.87–5.11)
RDW: 12.2 % (ref 11.5–15.5)
WBC: 8.9 10*3/uL (ref 4.0–10.5)
nRBC: 0 % (ref 0.0–0.2)

## 2020-01-23 LAB — BASIC METABOLIC PANEL
Anion gap: 9 (ref 5–15)
BUN: 16 mg/dL (ref 6–20)
CO2: 26 mmol/L (ref 22–32)
Calcium: 9.5 mg/dL (ref 8.9–10.3)
Chloride: 105 mmol/L (ref 98–111)
Creatinine, Ser: 0.67 mg/dL (ref 0.44–1.00)
GFR calc Af Amer: >60 mL/min (ref >60)
GFR calc non Af Amer: >60 mL/min (ref >60)
Glucose, Bld: 107 mg/dL — ABNORMAL HIGH (ref 70–99)
Potassium: 3.9 mmol/L (ref 3.5–5.1)
Sodium: 140 mmol/L (ref 135–145)

## 2020-01-23 NOTE — ED Notes (Signed)
Discharge instructions reviewed with patient including follow up care. Patient has no further questions for ED staff at this time.

## 2020-01-23 NOTE — ED Notes (Signed)
Pt transported to CT ?

## 2020-01-23 NOTE — Discharge Instructions (Signed)
CAT scan without signs of masses, cancer, stroke or bleeding.  Blood work was normal.  Continue taking the amlodipine daily if your blood pressure remains elevated.  However you can stop the muscle relaxer and prednisone.  If you start having vision problems, difficulty walking, numbness or weakness on one side of your body please return to the emergency room immediately

## 2020-01-23 NOTE — ED Provider Notes (Signed)
[facility] EMERGENCY DEPARTMENT Provider Note   CSN: [id number] Arrival date & time: 01/22/20  2031     History Chief Complaint  Patient presents with  . Hypertension  . Headache    Kristin [name] is a 59 y.o. female.  The history is provided by the patient.  Hypertension This is a new problem. Episode onset: 7 days. The problem occurs constantly. The problem has not changed since onset.Associated symptoms include headaches. Associated symptoms comments: No fever, nausea, vomiting, visual changes, unilateral numbness or weakness.  No dizziness or gait difficulties.  Patient has been taking blood pressure medication since Tuesday but reports her blood pressure still been elevated and she does get a headache every day which seems to be worse by the end of the day.  The headache does wax and wane as well as the blood pressure.  Prior to this she would only have hypertension at the doctor's office but her blood pressure will be normal the rest of the time.  She came today and waited for a prolonged period in the waiting room but was worried because the headache had persisted.  She did try to take Excedrin Migraine without relief.  She had also recently been given prednisone and a muscle relaxer because she does occasionally have neck pain.  However her headache is mostly centered over the top of her head and her temples bilaterally.. Nothing aggravates the symptoms. Nothing relieves the symptoms. Treatments tried: excedrin. The treatment provided no relief.  Headache      Past Medical History:  Diagnosis Date  . Allergy   . Dyspnea   . Elevated blood pressure reading   . Endometriosis   . Hyperlipidemia   . Hypertension   . IBS (irritable bowel syndrome)   . Loss of hair   . Pain, abdominal   . Skin cancer, basal cell   . Vitamin D deficiency     Patient Active Problem List   Diagnosis Date Noted  . Intramural uterine fibroid 01/29/2019  . Dyspnea 02/23/2012     Past Surgical History:  Procedure Laterality Date  . CESAREAN SECTION  1983  . CHOLECYSTECTOMY  1990   In [place]  . CYSTOURETHROSCOPY  2016  . HYSTEROSCOPY  2015  . LAPAROSCOPY  03/12/2016   with resection of pelvic adhesions  . LAPAROTOMY  1990   post operative infection from cholecystectomy  . OOPHORECTOMY Left 2000     OB History    Gravida  1   Para  1   Term  1   Preterm      AB      Living  1     SAB      TAB      Ectopic      Multiple      Live Births  1           Family History  Problem Relation Age of Onset  . Heart disease Father   . Liver disease Father   . Diabetes Mother   . Colon cancer Neg Hx     Social History   Tobacco Use  . Smoking status: Former Smoker    Packs/day: 1.00    Years: 10.00    Pack years: 10.00    Types: Cigarettes    Quit date: 05/19/2004    Years since quitting: 15.6  . Smokeless tobacco: Never Used  Vaping Use  . Vaping Use: Never used  Substance Use Topics  . Alcohol use:  No  . Drug use: No    Home Medications Prior to Admission medications   Medication Sig Start Date End Date Taking? Authorizing Provider  B Complex-C-Folic Acid TABS  8/84/16   [provider]  Biotin (BIOTIN 5000) 5 MG CAPS  07/20/18   [provider]  Boswellia-Glucosamine-Vit D (OSTEO BI-FLEX ONE PER DAY PO)  06/19/14   [provider]  calcium carbonate (CALCIUM 600) 600 MG TABS tablet Take 600 mg by mouth daily.    [provider]  Cholecalciferol (VITAMIN D3) 50 MCG (2000 UT) TABS  03/17/18   [provider]  clobetasol (TEMOVATE) 0.05 % external solution APP EXT TO THE SCALP BID FOR 30 DAYS THEN STOP 03/15/18   [provider]  Coenzyme Q10 (COQ-10) 100 MG CAPS  07/30/14   [provider]  EPINEPHrine (EPIPEN 2-PAK) 0.3 mg/0.3 mL IJ SOAJ injection Inject 0.3 mg into the muscle once as needed (severe allergic reaction).    [provider]  estradiol (ESTRACE)  1 MG tablet Take 1 tablet (1 mg total) by mouth daily with lunch. 01/26/19   Megan Salon, MD  Evening Primrose Oil 1000 MG CAPS  06/19/14   [provider]  finasteride (PROSCAR) 5 MG tablet TK 1 T PO  QD. 01/17/19   [provider]  hyoscyamine (LEVBID) 0.375 MG 12 hr tablet at bedtime. 03/10/18   [provider]  ibuprofen (ADVIL,MOTRIN) 200 MG tablet Take 200-600 mg by mouth 2 (two) times daily as needed (pain).     [provider]  meclizine (ANTIVERT) 25 MG tablet Take 1/2 to 1 as needed 02/21/19   [provider]  methocarbamol (ROBAXIN) 500 MG tablet Take 500-1,000 mg by mouth every 6 (six) hours as needed. 02/21/19   [provider]  metoprolol tartrate (LOPRESSOR) 100 MG tablet Take 1 tablet (100 mg total) by mouth once for 1 dose. Take 2 hours prior to your Coronary CT. 02/22/19 02/22/19  Dorothy Spark, MD  Multiple Vitamin (MULTIVITAMIN WITH MINERALS) TABS tablet Take 1 tablet by mouth at bedtime.     [provider]  progesterone (PROMETRIUM) 200 MG capsule Take 1 capsule (200 mg total) by mouth daily. 01/26/19   Megan Salon, MD  rosuvastatin (CRESTOR) 5 MG tablet Take 1 tablet (5 mg total) by mouth daily. Pt needs to keep upcoming appt in Sept to continue getting refills - 1st attempt 12/29/19   Dorothy Spark, MD  tacrolimus (PROTOPIC) 0.1 % ointment  12/21/18   [provider]  valsartan (DIOVAN) 80 MG tablet Take 1 tablet (80 mg total) by mouth daily. 11/15/19   Dorothy Spark, MD  vitamin E 400 UNIT capsule Take 400 Units by mouth daily with lunch.    [provider]    Allergies    Nickel, Other, Oxybutynin chloride er, Diclofenac sodium, Oxybutynin chloride, and Sporanox [itraconazole]  Review of Systems   Review of Systems  Neurological: Positive for headaches.  All other systems reviewed and are negative.   Physical Exam Updated Vital Signs BP (!) 146/79 (BP Location: Right Arm)    Pulse 70   Temp 98.9 F (37.2 C) (Oral)   Resp 18   LMP 02/23/2012   SpO2 98%   Physical Exam Vitals and nursing note reviewed.  Constitutional:      General: She is not in acute distress.    Appearance: She is well-developed and normal weight.  HENT:     Head:  Normocephalic and atraumatic.  Eyes:     Extraocular Movements: Extraocular movements intact.     Pupils: Pupils are equal, round, and reactive to light.  Cardiovascular:     Rate and Rhythm: Normal rate and regular rhythm.     Heart sounds: Normal heart sounds. No murmur heard.  No friction rub.  Pulmonary:     Effort: Pulmonary effort is normal.     Breath sounds: Normal breath sounds. No wheezing or rales.  Abdominal:     General: Bowel sounds are normal. There is no distension.     Palpations: Abdomen is soft.     Tenderness: There is no abdominal tenderness. There is no guarding or rebound.  Musculoskeletal:        General: No tenderness. Normal range of motion.     Cervical back: Normal range of motion and neck supple.     Right lower leg: No edema.     Left lower leg: No edema.     Comments: No edema  Skin:    General: Skin is warm and dry.     Capillary Refill: Capillary refill takes less than 2 seconds.     Findings: No rash.  Neurological:     General: No focal deficit present.     Mental Status: She is alert and oriented to person, place, and time. Mental status is at baseline.     Cranial Nerves: No cranial nerve deficit.     Sensory: No sensory deficit.     Motor: No weakness or pronator drift.     Coordination: Coordination is intact.     Gait: Gait is intact. Gait normal.  Psychiatric:        Mood and Affect: Mood normal.        Behavior: Behavior normal.        Thought Content: Thought content normal.      ED Results / Procedures / Treatments   Labs (all labs ordered are listed, but only abnormal results are displayed) Labs Reviewed  CBC WITH DIFFERENTIAL/PLATELET  BASIC METABOLIC  PANEL    EKG EKG Interpretation  Date/Time:  Sunday January 22 2020 21:13:43 EDT Ventricular Rate:  80 PR Interval:  186 QRS Duration: 80 QT Interval:  346 QTC Calculation: 399 R Axis:   80 Text Interpretation: Normal sinus rhythm Nonspecific T wave abnormality No previous tracing Confirmed by Blanchie Dessert [phone]) on 01/23/2020 7:55:02 AM   Radiology CT HEAD WO CONTRAST  Result Date: 01/23/2020 CLINICAL DATA:  Headache. EXAM: CT HEAD WITHOUT CONTRAST TECHNIQUE: Contiguous axial images were obtained from the base of the skull through the vertex without intravenous contrast. COMPARISON:  April 07, 2017. FINDINGS: Brain: No evidence of acute infarction, hemorrhage, hydrocephalus, extra-axial collection or mass lesion/mass effect. Vascular: No hyperdense vessel or unexpected calcification. Skull: Normal. Negative for fracture or focal lesion. Sinuses/Orbits: No acute finding. Other: None. IMPRESSION: Normal head CT. Electronically Signed   By: Marijo Conception M.D.   On: 01/23/2020 08:45    Procedures Procedures (including critical care time)  Medications Ordered in ED Medications - No data to display  ED Course  I have reviewed the triage vital signs and the nursing notes.  Pertinent labs & imaging results that were available during my care of the patient were reviewed by me and considered in my medical decision making (see chart for details).    MDM Rules/Calculators/A&P  59 year old female with a history of endometriosis, hypertension but patient reports that she tried taking blood pressure medication once and it dropped her blood pressure very low.  However since Tuesday patient's blood pressure has been elevated and she has had intermittent headaches.  They seem to be worse in the evening and better in the morning.  She denies any neurologic symptoms associated with this.  She denies any congestion or sinus type symptoms.  She was seen at urgent  care a few days ago and was started on prednisone and methocarbamol which she reports has not improved her symptoms.  When she checked her blood pressure last night it was 333 systolic over 80 and she was concerned and came to the ER for further evaluation.  On exam patient has a normal neuro exam, initial blood pressure here was elevated however over time it has improved between 545-625 systolic.  She still has a mild headache but denies other symptoms.  Patient CBC and BMP are within normal limits.  No evidence of renal abnormalities.  Head CT was negative for recent stroke or bleed.  Findings discussed with the patient.  Low suspicion for temporal arteritis, meningitis, encephalitis, subarachnoid hemorrhage or venous cavernous thrombosis.  Encourage patient to check her blood pressure at home and take the amlodipine daily if it remains elevated.  Encouraged her to follow-up with her PCP this week for further management.  EKG with some minor T wave abnormalities but nothing to suggest acute issue.  MDM Number of Diagnoses or Management Options   Amount and/or Complexity of Data Reviewed Clinical lab tests: ordered and reviewed Tests in the radiology section of CPT: ordered and reviewed Tests in the medicine section of CPT: ordered and reviewed Decide to obtain previous medical records or to obtain history from someone other than the patient: yes Obtain history from someone other than the patient: no Review and summarize past medical records: yes Independent visualization of images, tracings, or specimens: yes  Risk of Complications, Morbidity, and/or Mortality Presenting problems: moderate Diagnostic procedures: low Management options: minimal  Patient Progress Patient progress: stable  Final Clinical Impression(s) / ED Diagnoses Final diagnoses:  Acute nonintractable headache, unspecified headache type  Hypertension, unspecified type    Rx / DC Orders ED Discharge Orders    None        Blanchie Dessert, MD 01/23/20 1110

## 2020-01-24 ENCOUNTER — Other Ambulatory Visit: Payer: Self-pay | Admitting: Obstetrics & Gynecology

## 2020-01-24 DIAGNOSIS — Z1231 Encounter for screening mammogram for malignant neoplasm of breast: Secondary | ICD-10-CM

## 2020-02-07 ENCOUNTER — Other Ambulatory Visit: Payer: Self-pay

## 2020-02-07 ENCOUNTER — Ambulatory Visit (INDEPENDENT_AMBULATORY_CARE_PROVIDER_SITE_OTHER): Payer: BLUE CROSS/BLUE SHIELD | Admitting: Cardiology

## 2020-02-07 ENCOUNTER — Encounter: Payer: Self-pay | Admitting: Cardiology

## 2020-02-07 VITALS — BP 138/88 | HR 76 | Ht 65.0 in | Wt 162.4 lb

## 2020-02-07 DIAGNOSIS — I10 Essential (primary) hypertension: Secondary | ICD-10-CM | POA: Diagnosis not present

## 2020-02-07 DIAGNOSIS — E785 Hyperlipidemia, unspecified: Secondary | ICD-10-CM

## 2020-02-07 DIAGNOSIS — Q211 Atrial septal defect, unspecified: Secondary | ICD-10-CM

## 2020-02-07 NOTE — Patient Instructions (Signed)
Medication Instructions:   Your physician recommends that you continue on your current medications as directed. Please refer to the Current Medication list given to you today.  *If you need a refill on your cardiac medications before your next appointment, please call your pharmacy*   Testing/Procedures:   PER DR. Meda Coffee WE CAN CANCEL YOUR ECHO FOR March 16, 2020-- I CANCELLED THIS TEST FOR YOU ALREADY   Follow-Up: At [facility], you and your health needs are our priority.  As part of our continuing mission to provide you with exceptional heart care, we have created designated Provider Care Teams.  These Care Teams include your primary Cardiologist (physician) and Advanced Practice Providers (APPs -  Physician Assistants and Nurse Practitioners) who all work together to provide you with the care you need, when you need it.  We recommend signing up for the patient portal called "MyChart".  Sign up information is provided on this After Visit Summary.  MyChart is used to connect with patients for Virtual Visits (Telemedicine).  Patients are able to view lab/test results, encounter notes, upcoming appointments, etc.  Non-urgent messages can be sent to your provider as well.   To learn more about what you can do with MyChart, go to [email or web address].    Your next appointment:   12 month(s)  The format for your next appointment:   In Person  Provider:   Ena Dawley, MD

## 2020-02-07 NOTE — Progress Notes (Signed)
Cardiology Office Note:    Date:  02/07/2020   ID:  Kristin [name], DOB [birth year], MRN [id number]  PCP:  London Pepper, MD  Cardiologist:  No primary care provider on file.  Electrophysiologist:  None   Referring MD: London Pepper, MD   Chief complaint: Hypertension, palpitations  History of Present Illness:    Kristin [name] is a 59 y.o. female with a hx of hypertension, hyperlipidemia who is being referred by her primary care physician for concerns of chest pain.  The patient has been experiencing chest pain both at rest and on exertion they felt pressure-like and can appear while she is asleep and wake her up.  They would last up to 30 minutes.  She has known hyperlipidemia that has not been treated.  Most recent lipids HDL 66, triglycerides 137 LDL 164.  She is a former smoker and quit in 2007.  Her family history is not significant for premature coronary artery disease.  Father died at age 38 secondary to sepsis, he had a pacemaker.  The patient is coming after a year, last year she underwent coronary CTA that showed calcium score of 0 and no evidence for coronary artery disease however she had an incidental finding of functionally bicuspid aortic valve.  Echo showed no aortic stenosis, and LVEF 55 to 60%.  She has been doing overall very well, however had an episode of elevated blood pressure with headache after which she was prescribed steroids that further elevated her blood pressure.  She was also started on valsartan that made her profoundly tired and dizzy so she discontinued.  She brings blood pressure diary from home where 95% of her blood pressures are at goal in 110-120 range.  Past Medical History:  Diagnosis Date  . Allergy   . Dyspnea   . Elevated blood pressure reading   . Endometriosis   . Hyperlipidemia   . Hypertension   . IBS (irritable bowel syndrome)   . Loss of hair   . Pain, abdominal   . Skin cancer, basal cell   . Vitamin D deficiency    Past  Surgical History:  Procedure Laterality Date  . CESAREAN SECTION  1983  . CHOLECYSTECTOMY  1990   In [place]  . CYSTOURETHROSCOPY  2016  . HYSTEROSCOPY  2015  . LAPAROSCOPY  03/12/2016   with resection of pelvic adhesions  . LAPAROTOMY  1990   post operative infection from cholecystectomy  . OOPHORECTOMY Left 2000   Current Medications: Current Meds  Medication Sig  . B Complex-C-Folic Acid TABS   . Biotin (BIOTIN 5000) 5 MG CAPS   . Boswellia-Glucosamine-Vit D (OSTEO BI-FLEX ONE PER DAY PO)   . calcium carbonate (CALCIUM 600) 600 MG TABS tablet Take 600 mg by mouth daily.  . Cholecalciferol (VITAMIN D3) 50 MCG (2000 UT) TABS   . clobetasol (TEMOVATE) 0.05 % external solution APP EXT TO THE SCALP BID FOR 30 DAYS THEN STOP  . Coenzyme Q10 (COQ-10) 100 MG CAPS   . estradiol (ESTRACE) 1 MG tablet Take 1 tablet (1 mg total) by mouth daily with lunch.  . Evening Primrose Oil 1000 MG CAPS   . finasteride (PROSCAR) 5 MG tablet TK 1 T PO  QD.  . hyoscyamine (LEVBID) 0.375 MG 12 hr tablet at bedtime.  Marland Kitchen ibuprofen (ADVIL,MOTRIN) 200 MG tablet Take 200-600 mg by mouth 2 (two) times daily as needed (pain).   . meclizine (ANTIVERT) 25 MG tablet Take 1/2 to 1 as needed  . Multiple  Vitamin (MULTIVITAMIN WITH MINERALS) TABS tablet Take 1 tablet by mouth at bedtime.   . progesterone (PROMETRIUM) 200 MG capsule Take 1 capsule (200 mg total) by mouth daily.  . rosuvastatin (CRESTOR) 5 MG tablet Take 1 tablet (5 mg total) by mouth daily. Pt needs to keep upcoming appt in Sept to continue getting refills - 1st attempt  . tacrolimus (PROTOPIC) 0.1 % ointment   . vitamin E 400 UNIT capsule Take 400 Units by mouth daily with lunch.   Current Facility-Administered Medications for the 02/07/20 encounter (Office Visit) with Dorothy Spark, MD  Medication  . 0.9 %  sodium chloride infusion    Allergies:   Nickel, Other, Oxybutynin chloride er, Diclofenac sodium, Oxybutynin chloride, and Sporanox  [itraconazole]   Social History   Socioeconomic History  . Marital status: Married    Spouse name: Not on file  . Number of children: 1  . Years of education: Not on file  . Highest education level: Not on file  Occupational History  . Not on file  Tobacco Use  . Smoking status: Former Smoker    Packs/day: 1.00    Years: 10.00    Pack years: 10.00    Types: Cigarettes    Quit date: 05/19/2004    Years since quitting: 15.7  . Smokeless tobacco: Never Used  Vaping Use  . Vaping Use: Never used  Substance and Sexual Activity  . Alcohol use: No  . Drug use: No  . Sexual activity: Not Currently    Birth control/protection: Post-menopausal  Other Topics Concern  . Not on file  Social History Narrative  . Not on file   Social Determinants of Health   Financial Resource Strain:   . Difficulty of Paying Living Expenses: Not on file  Food Insecurity:   . Worried About Charity fundraiser in the Last Year: Not on file  . Ran Out of Food in the Last Year: Not on file  Transportation Needs:   . Lack of Transportation (Medical): Not on file  . Lack of Transportation (Non-Medical): Not on file  Physical Activity:   . Days of Exercise per Week: Not on file  . Minutes of Exercise per Session: Not on file  Stress:   . Feeling of Stress : Not on file  Social Connections:   . Frequency of Communication with Friends and Family: Not on file  . Frequency of Social Gatherings with Friends and Family: Not on file  . Attends Religious Services: Not on file  . Active Member of Clubs or Organizations: Not on file  . Attends Archivist Meetings: Not on file  . Marital Status: Not on file    Family History: The patient's family history includes Diabetes in her mother; Heart disease in her father; Liver disease in her father. There is no history of Colon cancer.  ROS:   Please see the history of present illness.    All other systems reviewed and are negative.  EKGs/Labs/Other  Studies Reviewed:    The following studies were reviewed today:  EKG:  EKG is ordered today.  The ekg ordered today demonstrates sinus bradycardia, 55 bpm, unchanged from prior, personally reviewed.  Recent Labs: 05/16/2019: ALT 35 01/23/2020: BUN 16; Creatinine, Ser 0.67; Hemoglobin 13.8; Platelets 313; Potassium 3.9; Sodium 140  Recent Lipid Panel    Component Value Date/Time   CHOL 177 05/16/2019 0754   TRIG 133 05/16/2019 0754   HDL 77 05/16/2019 0754   CHOLHDL 2.3  05/16/2019 0754   LDLCALC 77 05/16/2019 0754   Physical Exam:    VS:  BP 138/88   Pulse 76   Ht 5\' 5"  (1.651 m)   Wt 162 lb 6.4 oz (73.7 kg)   LMP 02/23/2012   SpO2 97%   BMI 27.02 kg/m     Wt Readings from Last 3 Encounters:  02/07/20 162 lb 6.4 oz (73.7 kg)  02/22/19 161 lb 6.4 oz (73.2 kg)  01/27/19 161 lb 9.6 oz (73.3 kg)    GEN:  Well nourished, well developed in no acute distress HEENT: Normal NECK: No JVD; No carotid bruits LYMPHATICS: No lymphadenopathy CARDIAC: RRR, no murmurs, rubs, gallops RESPIRATORY:  Clear to auscultation without rales, wheezing or rhonchi  ABDOMEN: Soft, non-tender, non-distended MUSCULOSKELETAL:  No edema; No deformity  SKIN: Warm and dry NEUROLOGIC:  Alert and oriented x 3 PSYCHIATRIC:  Normal affect   ASSESSMENT:    1. ASD (atrial septal defect)   2. Hyperlipidemia, unspecified hyperlipidemia type   3. Essential hypertension    PLAN:    In order of problems listed above:  1. Chest pain -risk factors include previous smoking, hypertension and untreated hyperlipidemia.  We will obtain coronary CTA to evaluate for coronary artery disease and tailor therapy based on results.  The patient underwent coronary CTA that showed normal coronaries, calcium score of 0 and no evidence for coronary artery disease.  We will continue low-dose rosuvastatin 5 mg daily that she is tolerating well. 2. Hyperlipidemia -continue Crestor 5 mg daily her lipids are at  goal. 3. Hypertension -her blood pressure is controlled 95% of the time, she did not tolerate valsartan, I have advised her to take 40 mg of valsartan on days when her blood pressure gets over 140 mmHg. 4. Palpitations -her Zio patch monitor showed only few episodes of very short runs of SVT.  Medication Adjustments/Labs and Tests Ordered: Current medicines are reviewed at length with the patient today.  Concerns regarding medicines are outlined above.  Orders Placed This Encounter  Procedures  . EKG 12-Lead   No orders of the defined types were placed in this encounter.  Patient Instructions  Medication Instructions:   Your physician recommends that you continue on your current medications as directed. Please refer to the Current Medication list given to you today.  *If you need a refill on your cardiac medications before your next appointment, please call your pharmacy*   Testing/Procedures:   PER DR. Meda Coffee WE CAN CANCEL YOUR ECHO FOR March 16, 2020-- I CANCELLED THIS TEST FOR YOU ALREADY   Follow-Up: At [facility], you and your health needs are our priority.  As part of our continuing mission to provide you with exceptional heart care, we have created designated Provider Care Teams.  These Care Teams include your primary Cardiologist (physician) and Advanced Practice Providers (APPs -  Physician Assistants and Nurse Practitioners) who all work together to provide you with the care you need, when you need it.  We recommend signing up for the patient portal called "MyChart".  Sign up information is provided on this After Visit Summary.  MyChart is used to connect with patients for Virtual Visits (Telemedicine).  Patients are able to view lab/test results, encounter notes, upcoming appointments, etc.  Non-urgent messages can be sent to your provider as well.   To learn more about what you can do with MyChart, go to [email or web address].    Your next appointment:   12  month(s)  The format for  your next appointment:   In Person  Provider:   Ena Dawley, MD            Signed, Ena Dawley, MD  02/07/2020 10:29 AM    [place]

## 2020-02-11 ENCOUNTER — Other Ambulatory Visit: Payer: Self-pay | Admitting: Obstetrics & Gynecology

## 2020-02-13 ENCOUNTER — Encounter: Payer: Self-pay | Admitting: Obstetrics & Gynecology

## 2020-02-13 NOTE — Telephone Encounter (Signed)
Left message for call back.

## 2020-02-13 NOTE — Telephone Encounter (Signed)
Patient notified & is aware we will notify her if it gets refilled or not.

## 2020-02-13 NOTE — Telephone Encounter (Signed)
Could you please let pt know that I reviewed her medical history prior to doing the refill and saw her echocardiogram results.  I've sent Dr. Meda Coffee a message to make sure she is ok with Kristin [name] staying on HRT.so I haven't done the refill request yet.  I will let her know what Dr. Meda Coffee says.  Thanks.

## 2020-02-13 NOTE — Telephone Encounter (Signed)
Medication refill request: prometrium 200mg  & estradiol 1mg  tablet Last AEX:  01-26-2019 rx's were sent for 81yr at [facility] Next AEX: 03-30-2020 Last MMG (if hormonal medication request): 03-09-2020 category d density birads 1:neg Refill authorized: please approve if appropriate

## 2020-02-15 ENCOUNTER — Other Ambulatory Visit: Payer: Self-pay | Admitting: *Deleted

## 2020-02-15 NOTE — Telephone Encounter (Signed)
Opened in error. See refill request dated 02-11-20.

## 2020-02-17 NOTE — Telephone Encounter (Signed)
Patient checking on status of medication.

## 2020-02-20 NOTE — Telephone Encounter (Signed)
Patient is calling to speak to [facility] about refill.

## 2020-02-21 NOTE — Telephone Encounter (Signed)
Patient asking if any information regarding refill. Patient aware I will forward this to Dr Sabra Heck.

## 2020-02-23 NOTE — Telephone Encounter (Signed)
Tried to call patient to let her know her rxs were sent to the pharmacy & a message came on saying she could not take calls right now. I tried call 3 times.

## 2020-03-09 ENCOUNTER — Ambulatory Visit
Admission: RE | Admit: 2020-03-09 | Discharge: 2020-03-09 | Disposition: A | Discharge status: Home / Self Care | Payer: BLUE CROSS/BLUE SHIELD | Source: Ambulatory Visit | Attending: Obstetrics & Gynecology | Admitting: Obstetrics & Gynecology

## 2020-03-09 ENCOUNTER — Other Ambulatory Visit: Payer: Self-pay

## 2020-03-09 DIAGNOSIS — Z1231 Encounter for screening mammogram for malignant neoplasm of breast: Secondary | ICD-10-CM

## 2020-03-12 NOTE — Progress Notes (Signed)
59 y.o. G51P1001 Married White or Caucasian female here for annual exam.  Doing well.  Has functional bicuspid aortic valve and ASD.  Reviewed with Dr. Meda Coffee risks for clotting with HRT and ASD.  There is no specific increased risks in literature other then risks in general with cardiac defects.  Risks reviewed with pt. She desires to stay on HRT and accepts there may be more risks.  Understands if has clot develop in heart chamber, could have a stroke.  She does voice clear understanding.  Denies vaginal bleeding.   Patient's last menstrual period was 02/23/2012.          Sexually active: No.  The current method of family planning is post menopausal status.    Exercising: Yes.    gym Smoker:  no  Health Maintenance: Pap:  01-26-2019 neg HPV HR neg History of abnormal Pap:  no MMG:  03-09-2020 category c density birads 1:neg Colonoscopy:  01-01-16 f/u 85yrs BMD:   11-01-2018 normal TDaP:  UTD per patient Pneumonia vaccine(s):  Not done Shingrix:   Done 2020 Hep C testing: done Screening Labs: done in August with Dr. Orland Mustard.   reports that she quit smoking about 15 years ago. Her smoking use included cigarettes. She has a 10.00 pack-year smoking history. She has never used smokeless tobacco. She reports that she does not drink alcohol and does not use drugs.  Past Medical History:  Diagnosis Date  . Allergy   . 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  . Dyspnea   . Endometriosis   . Hyperlipidemia   . Hypertension   . IBS (irritable bowel syndrome)   . Loss of hair   . Pain, abdominal   . Skin cancer, basal cell   . Vitamin D deficiency     Past Surgical History:  Procedure Laterality Date  . CESAREAN SECTION  1983  . CHOLECYSTECTOMY  1990   In [place]  . CYSTOURETHROSCOPY  2016  . HYSTEROSCOPY  2015  . LAPAROSCOPY  03/12/2016   with resection of pelvic adhesions  . LAPAROTOMY  1990   post operative infection from cholecystectomy  . OOPHORECTOMY Left  2000    Current Outpatient Medications  Medication Sig Dispense Refill  . B Complex-C-Folic Acid TABS     . Biotin (BIOTIN 5000) 5 MG CAPS     . Boswellia-Glucosamine-Vit D (OSTEO BI-FLEX ONE PER DAY PO)     . calcium carbonate (CALCIUM 600) 600 MG TABS tablet Take 600 mg by mouth daily.    . Cholecalciferol (VITAMIN D3) 50 MCG (2000 UT) TABS     . clobetasol (TEMOVATE) 0.05 % external solution APP EXT TO THE SCALP BID FOR 30 DAYS THEN STOP  0  . Coenzyme Q10 (COQ-10) 100 MG CAPS     . estradiol (ESTRACE) 1 MG tablet TAKE ONE TABLET BY MOUTH DAILY WITH LUNCH 90 tablet 4  . Evening Primrose Oil 1000 MG CAPS     . finasteride (PROSCAR) 5 MG tablet TK 1 T PO  QD.    . hyoscyamine (LEVBID) 0.375 MG 12 hr tablet at bedtime.  0  . ibuprofen (ADVIL,MOTRIN) 200 MG tablet Take 200-600 mg by mouth 2 (two) times daily as needed (pain).     . Multiple Vitamin (MULTIVITAMIN WITH MINERALS) TABS tablet Take 1 tablet by mouth at bedtime.     . rosuvastatin (CRESTOR) 5 MG tablet Take 1 tablet (5 mg total) by mouth daily. Pt needs to keep  upcoming appt in Sept to continue getting refills - 1st attempt 90 tablet 0  . vitamin E 400 UNIT capsule Take 400 Units by mouth daily with lunch.    . progesterone (PROMETRIUM) 200 MG capsule Take 1 capsule (200 mg total) by mouth daily. 90 capsule 4   Current Facility-Administered Medications  Medication Dose Route Frequency Provider Last Rate Last Admin  . 0.9 %  sodium chloride infusion  500 mL Intravenous Continuous Ladene Artist, MD        Family History  Problem Relation Age of Onset  . Heart disease Father   . Liver disease Father   . Diabetes Mother   . Kidney cancer Mother     Review of Systems  Constitutional: Negative.   HENT: Negative.   Eyes: Negative.   Respiratory: Negative.   Cardiovascular: Negative.   Gastrointestinal: Negative.   Endocrine: Negative.   Genitourinary: Negative.   Musculoskeletal: Negative.   Skin: Negative.    Allergic/Immunologic: Negative.   Neurological: Negative.   Hematological: Negative.   Psychiatric/Behavioral: Negative.     Exam:   BP 116/74   Pulse 68   Resp 16   Ht 5' 3.25" (1.607 m)   Wt 163 lb (73.9 kg)   LMP 02/23/2012   BMI 28.65 kg/m   Height: 5' 3.25" (160.7 cm)  General appearance: alert, cooperative and appears stated age Head: Normocephalic, without obvious abnormality, atraumatic Neck: no adenopathy, supple, symmetrical, trachea midline and thyroid normal to inspection and palpation Lungs: clear to auscultation bilaterally Breasts: normal appearance, no masses or tenderness Heart: regular rate and rhythm Abdomen: soft, non-tender; bowel sounds normal; no masses,  no organomegaly Extremities: extremities normal, atraumatic, no cyanosis or edema Skin: Skin color, texture, turgor normal. No rashes or lesions Lymph nodes: Cervical, supraclavicular, and axillary nodes normal. No abnormal inguinal nodes palpated Neurologic: Grossly normal   Pelvic: External genitalia:  no lesions              Urethra:  normal appearing urethra with no masses, tenderness or lesions              Bartholins and Skenes: normal                 Vagina: normal appearing vagina with normal color and discharge, no lesions              Cervix: no lesions              Pap taken: No. Bimanual Exam:  Uterus:  normal size, contour, position, consistency, mobility, non-tender              Adnexa: normal adnexa and no mass, fullness, tenderness               Rectovaginal: Confirms               Anus:  normal sphincter tone, no lesions  Chaperone, Grier Mitts, CMA, was present for exam.  A:  Well Woman with normal exam PMP, on HRT H/o endometrial polyps twice, s/p hysteroscopic resection with D&C H/o laparoscopy 2017 due to pelvic/abdominal pain with minimal pelvic adhesions H/o laparoscopic LSO 2000 Open cholecystectomy in [place] in 1990 with post op infection and reexploration Two small  fibroids noted on PUS 2020 Functional bicuspic aortic valve and ASD, followed by Dr. Meda Coffee  P:   Mammogram guidelines reviewed.  Doing yearly due to HRT use. pap smear neg with neg HR HPV 2020.  Not indicated today. RF for  estradiol 1.0mg  daily and Prometrium 200mg  daily.  #90/4RF each Colonoscopy 8/17.  Follow up 5 years BMD normal 10/2018 Return annually or prn

## 2020-03-13 ENCOUNTER — Ambulatory Visit: Payer: BLUE CROSS/BLUE SHIELD | Admitting: Obstetrics & Gynecology

## 2020-03-13 ENCOUNTER — Other Ambulatory Visit: Payer: Self-pay

## 2020-03-13 ENCOUNTER — Encounter: Payer: Self-pay | Admitting: Obstetrics & Gynecology

## 2020-03-13 VITALS — BP 116/74 | HR 68 | Resp 16 | Ht 63.25 in | Wt 163.0 lb

## 2020-03-13 DIAGNOSIS — Q211 Atrial septal defect, unspecified: Secondary | ICD-10-CM

## 2020-03-13 DIAGNOSIS — Q231 Congenital insufficiency of aortic valve: Secondary | ICD-10-CM

## 2020-03-13 DIAGNOSIS — E785 Hyperlipidemia, unspecified: Secondary | ICD-10-CM

## 2020-03-13 DIAGNOSIS — Z01419 Encounter for gynecological examination (general) (routine) without abnormal findings: Secondary | ICD-10-CM

## 2020-03-13 MED ORDER — ESTRADIOL 1 MG PO TABS: ORAL_TABLET | ORAL | 4 refills | Status: DC

## 2020-03-13 MED ORDER — PROGESTERONE 200 MG PO CAPS: 200.0000 mg | ORAL_CAPSULE | Freq: Every day | ORAL | 4 refills | Status: DC

## 2020-03-13 NOTE — Telephone Encounter (Signed)
Patient was aware & picked up rxs after they were sent in

## 2020-03-13 NOTE — Patient Instructions (Signed)
Can you check and see if you have the date for you last tetanus injection.

## 2020-03-16 ENCOUNTER — Encounter: Payer: Self-pay | Admitting: Obstetrics & Gynecology

## 2020-03-16 ENCOUNTER — Other Ambulatory Visit (HOSPITAL_COMMUNITY): Payer: Commercial Managed Care - PPO

## 2020-03-16 ENCOUNTER — Telehealth: Payer: Self-pay

## 2020-03-16 DIAGNOSIS — Q2381 Bicuspid aortic valve: Secondary | ICD-10-CM

## 2020-03-16 DIAGNOSIS — Q211 Atrial septal defect, unspecified: Secondary | ICD-10-CM

## 2020-03-16 DIAGNOSIS — E785 Hyperlipidemia, unspecified: Secondary | ICD-10-CM | POA: Insufficient documentation

## 2020-03-16 DIAGNOSIS — Q231 Congenital insufficiency of aortic valve: Secondary | ICD-10-CM | POA: Insufficient documentation

## 2020-03-16 HISTORY — DX: Bicuspid aortic valve: Q23.81

## 2020-03-16 HISTORY — DX: Atrial septal defect, unspecified: Q21.10

## 2020-03-16 NOTE — Telephone Encounter (Signed)
Can you check on her on Monday?  It would be highly unlikely to have pain from fibroids as she hasn't had bleeding in awhile.  Thanks.

## 2020-03-16 NOTE — Telephone Encounter (Signed)
Patient is having abdominal pain. She was in on 03/13/20 for AEX.

## 2020-03-16 NOTE — Telephone Encounter (Signed)
AEX 03/13/20 H/o endometrial polyps x 2, s/p hysteroescopy with resection and D&C H/o laparoscopy 2017 with minimal pelvic adhesions H/o laparoscopic LSO 2000 H/o open cholecystectomy in [place] in 1990 with post op infection with re-exploration H/o HRT use Fibroids  Spoke with pt. Pt states having mid lower abd pain x 1 day. Pt states thought it was constipation and had BM today and pain is better. Rated pain as 9 yesterday and 6 today. Pt taking 2 Tylenol and 2 Ibuprofen every 8 hours. Pt denies any vaginal bleeding. Pt states using heating pad and helps, but does not resolve. Pt states having no pain from fibroids since 2019.  Denies fever, chills, UTI sx.   Advised pt will review with Dr Sabra Heck. Pt given ER precautions over weekend. Pt agreeable.   Routing to Dr Sabra Heck.

## 2020-03-19 ENCOUNTER — Encounter: Payer: Self-pay | Admitting: Obstetrics & Gynecology

## 2020-03-19 ENCOUNTER — Ambulatory Visit: Payer: BLUE CROSS/BLUE SHIELD | Admitting: Obstetrics & Gynecology

## 2020-03-19 ENCOUNTER — Other Ambulatory Visit: Payer: Self-pay

## 2020-03-19 VITALS — BP 142/88 | HR 68 | Resp 14 | Ht 63.25 in | Wt 163.0 lb

## 2020-03-19 DIAGNOSIS — R102 Pelvic and perineal pain: Secondary | ICD-10-CM

## 2020-03-19 NOTE — Telephone Encounter (Signed)
Called placed to pt for update per Dr Sabra Heck. Spoke with pt. Pt states having more pain, constipation, abd pain and bloating, lower back pain and vaginal pressure since Saturday. Denies any vaginal bleeding and all UTI sx.  Pt rates pain as 7 and is taking OTC Tylenol and Ibuprofen as directed. Pt states having BM today after taking stool softener last night. Pt also states having some nausea since Saturday as well. Pt still thinks its fibroids and insists on coming for OV. Pt denies any vomiting, diarrhea, fever, chills. Pt has used heating pad as well for home remedy and has helped pain some.   Pt scheduled for OV with Dr Sabra Heck today 11/1 at 330 pm. Pt verbalized understanding to date and time of appt.  Routing to Dr Sabra Heck for review Encounter closed.

## 2020-03-19 NOTE — Progress Notes (Signed)
GYNECOLOGY  VISIT  CC:   Patient c/o having lower abdominal pain, constipation, bloating, vaginal pressure and lower back pain.   HPI: 59 y.o. G60P1001 Married White or Caucasian female here for lower abdominal pain that started on Thursday.  She did have some associated constipation.  She was able to have a bowel movement   Yesterday she took a probiotic and stool softener.  She's having some associated bowel movement.  She did have a bowel movement this morning and yesterday morning.  These were not hard.  Denies rectal bleeding or vaginal bleeding.  Denies dysuria.    She is also having some low back pain.    She's had similar episodic pain like this in the past where she's had GI evaluations and nothing is ever found.  Reports this pain feels similar.    GYNECOLOGIC HISTORY: Patient's last menstrual period was 02/23/2012. Contraception: Postmenopausal Menopausal hormone therapy: Estradiol  Patient Active Problem List   Diagnosis Date Noted  . Bicuspid aortic valve 03/16/2020  . ASD (atrial septal defect) 03/16/2020  . Hyperlipidemia   . Intramural uterine fibroid 01/29/2019    Past Medical History:  Diagnosis Date  . Allergy   . 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  . Dyspnea   . Endometriosis   . Hyperlipidemia   . Hypertension   . IBS (irritable bowel syndrome)   . Loss of hair   . Pain, abdominal   . Skin cancer, basal cell   . Vitamin D deficiency     Past Surgical History:  Procedure Laterality Date  . CESAREAN SECTION  1983  . CHOLECYSTECTOMY  1990   In [place]  . CYSTOURETHROSCOPY  2016  . HYSTEROSCOPY  2015  . LAPAROSCOPY  03/12/2016   with resection of pelvic adhesions  . LAPAROTOMY  1990   post operative infection from cholecystectomy  . OOPHORECTOMY Left 2000    MEDS:   Current Outpatient Medications on File Prior to Visit  Medication Sig Dispense Refill  . B Complex-C-Folic Acid TABS     . Biotin (BIOTIN 5000) 5 MG  CAPS     . [place]well[place]-Glucosamine-Vit D (OSTEO BI-FLEX ONE PER DAY PO)     . calcium carbonate (CALCIUM 600) 600 MG TABS tablet Take 600 mg by mouth daily.    . Cholecalciferol (VITAMIN D3) 50 MCG (2000 UT) TABS     . clobetasol (TEMOVATE) 0.05 % external solution APP EXT TO THE SCALP BID FOR 30 DAYS THEN STOP  0  . Coenzyme Q10 (COQ-10) 100 MG CAPS     . estradiol (ESTRACE) 1 MG tablet TAKE ONE TABLET BY MOUTH DAILY WITH LUNCH 90 tablet 4  . Evening Primrose Oil 1000 MG CAPS     . finasteride (PROSCAR) 5 MG tablet TK 1 T PO  QD.    . hyoscyamine (LEVBID) 0.375 MG 12 hr tablet at bedtime.  0  . ibuprofen (ADVIL,MOTRIN) 200 MG tablet Take 200-600 mg by mouth 2 (two) times daily as needed (pain).     . Multiple Vitamin (MULTIVITAMIN WITH MINERALS) TABS tablet Take 1 tablet by mouth at bedtime.     . progesterone (PROMETRIUM) 200 MG capsule Take 1 capsule (200 mg total) by mouth daily. 90 capsule 4  . rosuvastatin (CRESTOR) 5 MG tablet Take 1 tablet (5 mg total) by mouth daily. Pt needs to keep upcoming appt in Sept to continue getting refills - 1st attempt 90 tablet 0  . vitamin E 400  UNIT capsule Take 400 Units by mouth daily with lunch.     Current Facility-Administered Medications on File Prior to Visit  Medication Dose Route Frequency Provider Last Rate Last Admin  . 0.9 %  sodium chloride infusion  500 mL Intravenous Continuous Ladene Artist, MD        ALLERGIES: Nickel, Other, Oxybutynin chloride er, Diclofenac sodium, Oxybutynin chloride, and Sporanox [itraconazole]  Family History  Problem Relation Age of Onset  . Heart disease Father   . Liver disease Father   . Diabetes Mother   . Kidney cancer Mother     SH:  Married, non smoker  Review of Systems  Gastrointestinal: Positive for abdominal pain and constipation.       Bloating   Genitourinary:       Vaginal pressure  All other systems reviewed and are negative.   PHYSICAL EXAMINATION:    BP (!) 142/88 (BP  Location: Left Arm, Patient Position: Sitting, Cuff Size: Normal)   Pulse 68   Resp 14   Ht 5' 3.25" (1.607 m)   Wt 163 lb (73.9 kg)   LMP 02/23/2012   BMI 28.65 kg/m     General appearance: alert, cooperative and appears stated age Neck: no adenopathy, supple, symmetrical, trachea midline and thyroid normal to inspection and palpation  Abdomen: soft, mild mid lower pelvic pain and mild RLQ pain to deep palpation; bowel sounds normal; no masses,  no organomegaly Lymph:  no inguinal LAD noted  Pelvic: External genitalia:  no lesions              Urethra:  normal appearing urethra with no masses, tenderness or lesions              Bartholins and Skenes: normal                 Vagina: normal appearing vagina with normal color and discharge, no lesions              Cervix: no lesions              Bimanual Exam:  Uterus:  normal size, contour, position, consistency, mobility, non-tender              Adnexa: mild RLQ pain to palpation  Chaperone, Karmen Bongo, CMA, was present for exam.  Assessment: Pelvic pain  Plan: CBC with diff Ultrasound will be planned May need to have pt see GI after evaluation this week.  D/w pt reasons for going to the Emergency Room.

## 2020-03-20 ENCOUNTER — Telehealth: Payer: Self-pay

## 2020-03-20 LAB — CBC WITH DIFFERENTIAL/PLATELET
Basophils Absolute: 0.1 10*3/uL (ref 0.0–0.2)
Basos: 2 %
EOS (ABSOLUTE): 0.1 10*3/uL (ref 0.0–0.4)
Eos: 2 %
Hematocrit: 38.3 % (ref 34.0–46.6)
Hemoglobin: 12.6 g/dL (ref 11.1–15.9)
Immature Grans (Abs): 0 10*3/uL (ref 0.0–0.1)
Immature Granulocytes: 0 %
Lymphocytes Absolute: 2.5 10*3/uL (ref 0.7–3.1)
Lymphs: 37 %
MCH: 30 pg (ref 26.6–33.0)
MCHC: 32.9 g/dL (ref 31.5–35.7)
MCV: 91 fL (ref 79–97)
Monocytes Absolute: 0.6 10*3/uL (ref 0.1–0.9)
Monocytes: 9 %
Neutrophils Absolute: 3.4 10*3/uL (ref 1.4–7.0)
Neutrophils: 50 %
Platelets: 300 10*3/uL (ref 150–450)
RBC: 4.2 x10E6/uL (ref 3.77–5.28)
RDW: 12 % (ref 11.7–15.4)
WBC: 6.7 10*3/uL (ref 3.4–10.8)

## 2020-03-20 NOTE — Telephone Encounter (Signed)
Spoke with patient regarding benefits for scheduled Pelvic ultrasound. Patient acknowledges understanding of information presented. Encounter closed. 

## 2020-03-21 ENCOUNTER — Telehealth: Payer: Self-pay

## 2020-03-21 NOTE — Telephone Encounter (Signed)
Patient returned call

## 2020-03-21 NOTE — Telephone Encounter (Signed)
Patient cancelled [place] for (03/22/20). Patient stated she "is felling much better and does not think the [place] is necessary for now".

## 2020-03-21 NOTE — Telephone Encounter (Signed)
Left message for pt to return call to triage RN. 

## 2020-03-21 NOTE — Telephone Encounter (Signed)
Spoke with pt. Pt states cancelled PUS due to cost prohibitive right now with [employer]. Pt states symptoms are getting better and not worse. Pt is taking daily stool softener and is having more regular BMs since Monday. Denies any pain, abd bloating, vaginal pressure and lower back pain and NVD.  Pt advised to be seen in ER for worsening symptoms. Pt agreeable.  Advised will review with Dr Sabra Heck and return call if any further recommendations. Pt agreeable.   Routing to Dr Sabra Heck for review.   Ok to close encounter?

## 2020-03-22 ENCOUNTER — Other Ambulatory Visit: Payer: Self-pay | Admitting: Obstetrics & Gynecology

## 2020-03-22 ENCOUNTER — Other Ambulatory Visit: Payer: BLUE CROSS/BLUE SHIELD

## 2020-03-22 NOTE — Telephone Encounter (Signed)
Thank you for the update.  I am comfortable with this plan.  Ok to close encounter.  Thanks.

## 2020-03-22 NOTE — Telephone Encounter (Signed)
Encounter closed

## 2020-03-26 ENCOUNTER — Other Ambulatory Visit: Payer: Self-pay | Admitting: Cardiology

## 2020-03-26 DIAGNOSIS — Q211 Atrial septal defect, unspecified: Secondary | ICD-10-CM

## 2020-03-26 DIAGNOSIS — E785 Hyperlipidemia, unspecified: Secondary | ICD-10-CM

## 2020-03-30 ENCOUNTER — Ambulatory Visit: Payer: Commercial Managed Care - PPO | Admitting: Obstetrics & Gynecology

## 2020-05-10 ENCOUNTER — Other Ambulatory Visit: Payer: Self-pay | Admitting: Cardiology

## 2020-10-16 DIAGNOSIS — J302 Other seasonal allergic rhinitis: Secondary | ICD-10-CM | POA: Insufficient documentation

## 2020-10-16 DIAGNOSIS — E7800 Pure hypercholesterolemia, unspecified: Secondary | ICD-10-CM | POA: Insufficient documentation

## 2020-10-16 HISTORY — DX: Other seasonal allergic rhinitis: J30.2

## 2020-10-16 HISTORY — DX: Pure hypercholesterolemia, unspecified: E78.00

## 2020-11-12 ENCOUNTER — Encounter (HOSPITAL_BASED_OUTPATIENT_CLINIC_OR_DEPARTMENT_OTHER): Payer: Self-pay | Admitting: *Deleted

## 2020-11-12 ENCOUNTER — Emergency Department (HOSPITAL_BASED_OUTPATIENT_CLINIC_OR_DEPARTMENT_OTHER): Payer: 59

## 2020-11-12 ENCOUNTER — Emergency Department (HOSPITAL_BASED_OUTPATIENT_CLINIC_OR_DEPARTMENT_OTHER)
Admission: EM | Admit: 2020-11-12 | Discharge: 2020-11-12 | Disposition: A | Discharge status: Home / Self Care | Payer: 59 | Attending: Emergency Medicine | Admitting: Emergency Medicine

## 2020-11-12 ENCOUNTER — Other Ambulatory Visit: Payer: Self-pay

## 2020-11-12 DIAGNOSIS — U071 COVID-19: Secondary | ICD-10-CM | POA: Diagnosis not present

## 2020-11-12 DIAGNOSIS — Z85828 Personal history of other malignant neoplasm of skin: Secondary | ICD-10-CM | POA: Insufficient documentation

## 2020-11-12 DIAGNOSIS — N3001 Acute cystitis with hematuria: Secondary | ICD-10-CM | POA: Insufficient documentation

## 2020-11-12 DIAGNOSIS — R519 Headache, unspecified: Secondary | ICD-10-CM | POA: Diagnosis present

## 2020-11-12 DIAGNOSIS — I10 Essential (primary) hypertension: Secondary | ICD-10-CM | POA: Insufficient documentation

## 2020-11-12 DIAGNOSIS — Z87891 Personal history of nicotine dependence: Secondary | ICD-10-CM | POA: Diagnosis not present

## 2020-11-12 LAB — COMPREHENSIVE METABOLIC PANEL
ALT: 19 U/L (ref 0–44)
AST: 13 U/L — ABNORMAL LOW (ref 15–41)
Albumin: 4.3 g/dL (ref 3.5–5.0)
Alkaline Phosphatase: 48 U/L (ref 38–126)
Anion gap: 10 (ref 5–15)
BUN: 16 mg/dL (ref 6–20)
CO2: 26 mmol/L (ref 22–32)
Calcium: 9.6 mg/dL (ref 8.9–10.3)
Chloride: 100 mmol/L (ref 98–111)
Creatinine, Ser: 0.62 mg/dL (ref 0.44–1.00)
GFR, Estimated: >60 mL/min (ref >60)
Glucose, Bld: 130 mg/dL — ABNORMAL HIGH (ref 70–99)
Potassium: 3.8 mmol/L (ref 3.5–5.1)
Sodium: 136 mmol/L (ref 135–145)
Total Bilirubin: 0.3 mg/dL (ref 0.3–1.2)
Total Protein: 6.9 g/dL (ref 6.5–8.1)

## 2020-11-12 LAB — URINALYSIS, ROUTINE W REFLEX MICROSCOPIC
Bilirubin Urine: NEGATIVE
Glucose, UA: NEGATIVE mg/dL
Hgb urine dipstick: NEGATIVE
Ketones, ur: NEGATIVE mg/dL
Nitrite: NEGATIVE
Specific Gravity, Urine: 1.023 (ref 1.005–1.030)
pH: 6.5 (ref 5.0–8.0)

## 2020-11-12 LAB — CBC
HCT: 40 % (ref 36.0–46.0)
Hemoglobin: 13.9 g/dL (ref 12.0–15.0)
MCH: 30.5 pg (ref 26.0–34.0)
MCHC: 34.8 g/dL (ref 30.0–36.0)
MCV: 87.9 fL (ref 80.0–100.0)
Platelets: 288 10*3/uL (ref 150–400)
RBC: 4.55 MIL/uL (ref 3.87–5.11)
RDW: 11.8 % (ref 11.5–15.5)
WBC: 5.8 10*3/uL (ref 4.0–10.5)
nRBC: 0 % (ref 0.0–0.2)

## 2020-11-12 LAB — PREGNANCY, URINE: Preg Test, Ur: NEGATIVE

## 2020-11-12 LAB — LIPASE, BLOOD: Lipase: 40 U/L (ref 11–51)

## 2020-11-12 MED ORDER — CEPHALEXIN 500 MG PO CAPS: 500.0000 mg | ORAL_CAPSULE | Freq: Four times a day (QID) | ORAL | 0 refills | Status: AC

## 2020-11-12 NOTE — ED Triage Notes (Signed)
Covid + on June 19. Pt presents with headache, diarrhea, nausea, Saw her PCP on Friday, given  meds. Not any better, call PcP today and was told to come to the ED.

## 2020-11-12 NOTE — ED Notes (Signed)
This RN presented the AVS utilizing Teachback Method. Patient verbalizes understanding of Discharge Instructions. Opportunity for Questioning and Answers were provided. Patient Discharged from ED ambulatory to Home via SELF.

## 2020-11-12 NOTE — Discharge Instructions (Addendum)
Call your primary care doctor or specialist as discussed in the next 2-3 days.   Return immediately back to the ER if:  Your symptoms worsen within the next 12-24 hours. You develop new symptoms such as new fevers, persistent vomiting, new pain, shortness of breath, or new weakness or numbness, or if you have any other concerns.  

## 2020-11-12 NOTE — ED Provider Notes (Signed)
[facility] EMERGENCY DEPT Provider Note   CSN: [id number] Arrival date & time: 11/12/20  1825     History Chief Complaint  Patient presents with   Headache   Nausea    Kristin [name] is a 60 y.o. female.  Patient presents with COVID-like symptoms for about 1 week.  She has had cough fevers, intermittent headaches.  Symptoms have not improved despite taking Paxil.  She presents to ER for further evaluation.  Denies any new numbness or weakness, however states that she feels like she is in a mental "fog."        Past Medical History:  Diagnosis Date   Allergy    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   Dyspnea    Endometriosis    Hyperlipidemia    Hypertension    IBS (irritable bowel syndrome)    Loss of hair    Pain, abdominal    Skin cancer, basal cell    Vitamin D deficiency     Patient Active Problem List   Diagnosis Date Noted   Bicuspid aortic valve 03/16/2020   ASD (atrial septal defect) 03/16/2020   Hyperlipidemia    Intramural uterine fibroid 01/29/2019    Past Surgical History:  Procedure Laterality Date   [place]   In [place]   CYSTOURETHROSCOPY  2016   HYSTEROSCOPY  2015   LAPAROSCOPY  03/12/2016   with resection of pelvic adhesions   LAPAROTOMY  1990   post operative infection from cholecystectomy   OOPHORECTOMY Left 2000     OB History     Gravida  1   Para  1   Term  1   Preterm      AB      Living  1      SAB      IAB      Ectopic      Multiple      Live Births  1           Family History  Problem Relation Age of Onset   Heart disease Father    Liver disease Father    Diabetes Mother    Kidney cancer Mother     Social History   Tobacco Use   Smoking status: Former    Packs/day: 1.00    Years: 10.00    Pack years: 10.00    Types: Cigarettes    Quit date: 05/19/2004    Years since quitting: 16.4   Smokeless tobacco:  Never  Vaping Use   Vaping Use: Never used  Substance Use Topics   Alcohol use: No   Drug use: No    Home Medications Prior to Admission medications   Medication Sig Start Date End Date Taking? Authorizing Provider  B Complex-C-Folic Acid TABS  3/41/93  Yes [provider]  Biotin 5 MG CAPS  07/20/18  Yes [provider]  Boswellia-Glucosamine-Vit D (OSTEO BI-FLEX ONE PER DAY PO)  06/19/14  Yes [provider]  calcium carbonate (OS-CAL) 600 MG TABS tablet Take 600 mg by mouth daily.   Yes [provider]  cephALEXin (KEFLEX) 500 MG capsule Take 1 capsule (500 mg total) by mouth 4 (four) times daily for 5 days. 11/12/20 11/17/20 Yes Luna Fuse, MD  Cholecalciferol (VITAMIN D3) 50 MCG (2000 UT) TABS  03/17/18  Yes [provider]  Coenzyme Q10 (COQ-10) 100 MG CAPS  07/30/14  Yes [provider]  estradiol (ESTRACE) 1 MG tablet TAKE ONE TABLET BY MOUTH DAILY WITH LUNCH 03/13/20  Yes Megan Salon, MD  Evening Primrose Oil 1000 MG CAPS  06/19/14  Yes [provider]  finasteride (PROSCAR) 5 MG tablet TK 1 T PO  QD. 01/17/19  Yes [provider]  hyoscyamine (LEVBID) 0.375 MG 12 hr tablet at bedtime. 03/10/18  Yes [provider]  ibuprofen (ADVIL,MOTRIN) 200 MG tablet Take 200-600 mg by mouth 2 (two) times daily as needed (pain).    Yes [provider]  Multiple Vitamin (MULTIVITAMIN WITH MINERALS) TABS tablet Take 1 tablet by mouth at bedtime.    Yes [provider]  progesterone (PROMETRIUM) 200 MG capsule Take 1 capsule (200 mg total) by mouth daily. 03/13/20  Yes Megan Salon, MD  rosuvastatin (CRESTOR) 5 MG tablet Take 1 tablet (5 mg total) by mouth daily. 03/26/20  Yes Dorothy Spark, MD  clobetasol (TEMOVATE) 0.05 % external solution APP EXT TO THE SCALP BID FOR 30 DAYS THEN STOP 03/15/18   [provider]  vitamin E 400 UNIT capsule Take 400 Units by mouth daily with lunch.     [provider]    Allergies    Nickel, Other, Oxybutynin chloride er, Diclofenac sodium, Oxybutynin chloride, and Sporanox [itraconazole]  Review of Systems   Review of Systems  Constitutional:  Positive for fever.  HENT:  Negative for ear pain.   Eyes:  Negative for pain.  Respiratory:  Positive for cough.   Cardiovascular:  Negative for chest pain.  Gastrointestinal:  Negative for abdominal pain.  Genitourinary:  Negative for flank pain.  Musculoskeletal:  Negative for back pain.  Skin:  Negative for rash.  Neurological:  Positive for headaches.   Physical Exam Updated Vital Signs BP (!) 142/93 (BP Location: Right Arm)   Pulse 67   Temp 98.5 F (36.9 C)   Resp 12   Ht 5\' 3"  (1.6 m)   Wt 74.8 kg   LMP 02/23/2012   SpO2 98%   BMI 29.23 kg/m   Physical Exam Constitutional:      General: She is not in acute distress.    Appearance: Normal appearance.  HENT:     Head: Normocephalic.     Nose: Nose normal.  Eyes:     Extraocular Movements: Extraocular movements intact.  Cardiovascular:     Rate and Rhythm: Normal rate.  Pulmonary:     Effort: Pulmonary effort is normal.  Musculoskeletal:        General: Normal range of motion.     Cervical back: Normal range of motion.  Neurological:     General: No focal deficit present.     Mental Status: She is alert and oriented to person, place, and time. Mental status is at baseline.     Cranial Nerves: No cranial nerve deficit.     Motor: No weakness.    ED Results / Procedures / Treatments   Labs (all labs ordered are listed, but only abnormal results are displayed) Labs Reviewed  COMPREHENSIVE METABOLIC PANEL - Abnormal; Notable for the following components:      Result Value   Glucose, Bld 130 (*)    AST 13 (*)    All other components within normal limits  URINALYSIS, ROUTINE W REFLEX MICROSCOPIC - Abnormal; Notable for the following components:   APPearance HAZY (*)    Protein, ur TRACE (*)     Leukocytes,Ua LARGE (*)    Bacteria,  UA RARE (*)    Non Squamous Epithelial 0-5 (*)    All other components within normal limits  LIPASE, BLOOD  CBC  PREGNANCY, URINE    EKG None  Radiology CT Head Wo Contrast  Result Date: 11/12/2020 CLINICAL DATA:  Recent coronavirus infection. Hemorrhage. Question intracranial hemorrhage. EXAM: CT HEAD WITHOUT CONTRAST TECHNIQUE: Contiguous axial images were obtained from the base of the skull through the vertex without intravenous contrast. COMPARISON:  01/23/2020 FINDINGS: Brain: The brain shows a normal appearance without evidence of malformation, atrophy, old or acute small or large vessel infarction, mass lesion, hemorrhage, hydrocephalus or extra-axial collection. Vascular: No hyperdense vessel. No evidence of atherosclerotic calcification. Skull: Normal.  No traumatic finding.  No focal bone lesion. Sinuses/Orbits: Very minimal mucosal thickening of the sinuses. Small amount of fluid layering in the right frontal sinus. Orbits negative. Other: None significant IMPRESSION: Normal appearance of the brain. No intracranial abnormality. There are some inflammatory changes of paranasal sinuses as above. Electronically Signed   By: Nelson Chimes M.D.   On: 11/12/2020 19:51    Procedures Procedures   Medications Ordered in ED Medications - No data to display  ED Course  I have reviewed the triage vital signs and the nursing notes.  Pertinent labs & imaging results that were available during my care of the patient were reviewed by me and considered in my medical decision making (see chart for details).    MDM Rules/Calculators/A&P                          Labs are sent which are unremarkable.  Sodium chemistry normal.  Symptoms are more consistent with COVID symptoms without any electrolyte abnormalities.  Recommend continue Tylenol Motrin in the clinic as needed.  Recommending follow-up with her doctors within 2 to 3 days.  Recommending immediate  return for difficulty breathing or any additional concerns.  Final Clinical Impression(s) / ED Diagnoses Final diagnoses:  Acute cystitis with hematuria  COVID-19 virus infection    Rx / DC Orders ED Discharge Orders          Ordered    cephALEXin (KEFLEX) 500 MG capsule  4 times daily        11/12/20 2046             Luna Fuse, MD 11/12/20 2047

## 2021-01-28 ENCOUNTER — Other Ambulatory Visit: Payer: Self-pay

## 2021-01-28 DIAGNOSIS — R109 Unspecified abdominal pain: Secondary | ICD-10-CM

## 2021-01-28 DIAGNOSIS — F439 Reaction to severe stress, unspecified: Secondary | ICD-10-CM

## 2021-01-28 DIAGNOSIS — N809 Endometriosis, unspecified: Secondary | ICD-10-CM | POA: Insufficient documentation

## 2021-01-28 DIAGNOSIS — R030 Elevated blood-pressure reading, without diagnosis of hypertension: Secondary | ICD-10-CM

## 2021-01-28 DIAGNOSIS — C4491 Basal cell carcinoma of skin, unspecified: Secondary | ICD-10-CM | POA: Insufficient documentation

## 2021-01-28 DIAGNOSIS — L659 Nonscarring hair loss, unspecified: Secondary | ICD-10-CM

## 2021-01-28 DIAGNOSIS — I10 Essential (primary) hypertension: Secondary | ICD-10-CM | POA: Insufficient documentation

## 2021-01-28 DIAGNOSIS — T7840XA Allergy, unspecified, initial encounter: Secondary | ICD-10-CM | POA: Insufficient documentation

## 2021-01-28 DIAGNOSIS — E559 Vitamin D deficiency, unspecified: Secondary | ICD-10-CM | POA: Insufficient documentation

## 2021-01-28 DIAGNOSIS — K589 Irritable bowel syndrome without diarrhea: Secondary | ICD-10-CM | POA: Insufficient documentation

## 2021-01-28 HISTORY — DX: Reaction to severe stress, unspecified: F43.9

## 2021-01-28 HISTORY — DX: Nonscarring hair loss, unspecified: L65.9

## 2021-01-28 HISTORY — DX: Unspecified abdominal pain: R10.9

## 2021-01-28 HISTORY — DX: Elevated blood-pressure reading, without diagnosis of hypertension: R03.0

## 2021-01-29 ENCOUNTER — Encounter: Payer: Self-pay | Admitting: Cardiology

## 2021-01-29 ENCOUNTER — Other Ambulatory Visit: Payer: Self-pay

## 2021-01-29 ENCOUNTER — Ambulatory Visit (INDEPENDENT_AMBULATORY_CARE_PROVIDER_SITE_OTHER): Payer: 59 | Admitting: Cardiology

## 2021-01-29 VITALS — BP 132/88 | HR 81 | Ht 64.0 in | Wt 171.0 lb

## 2021-01-29 DIAGNOSIS — R0609 Other forms of dyspnea: Secondary | ICD-10-CM

## 2021-01-29 DIAGNOSIS — R0600 Dyspnea, unspecified: Secondary | ICD-10-CM | POA: Diagnosis not present

## 2021-01-29 DIAGNOSIS — Q231 Congenital insufficiency of aortic valve: Secondary | ICD-10-CM

## 2021-01-29 DIAGNOSIS — L439 Lichen planus, unspecified: Secondary | ICD-10-CM

## 2021-01-29 DIAGNOSIS — Q211 Atrial septal defect, unspecified: Secondary | ICD-10-CM

## 2021-01-29 DIAGNOSIS — E7800 Pure hypercholesterolemia, unspecified: Secondary | ICD-10-CM

## 2021-01-29 HISTORY — DX: Lichen planus, unspecified: L43.9

## 2021-01-29 NOTE — Progress Notes (Signed)
Cardiology Office Note:    Date:  01/29/2021   ID:  Kristin [name], DOB 1961-03-27, MRN [id number]  PCP:  London Pepper, MD  Cardiologist:  Jenne Campus, MD    Referring MD: London Pepper, MD   Chief Complaint  Patient presents with   Establish Care    Dr. Ena Dawley previous cardiologist    History of Present Illness:    Kristin [name] is a 60 y.o. female who was polish on we did speak [place] during our visit.  She got past medical history significant for atrial septal defect measuring up to 2 cm picked up on coronary CT angio, she also got partial bicuspid aortic valve.  She went initially to Dr. Meda Coffee for evaluation of chest pain.  Evaluation has been negative she did have coronary CT angio done which showed no significant coronary artery disease.  She still complain of having some chest pain but that did not happen only when she bent forward on when she leans forward but does not happen with exercise.  She did notice a little more shortness of breath lately.  But this is one of the reason why she would like to be evaluated.  Denies have any palpitation no tightness squeezing pressure burning chest except as described above.  Past Medical History:  Diagnosis Date   Allergy    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   Dyspnea    Endometriosis    Hyperlipidemia    Hypertension    IBS (irritable bowel syndrome)    Loss of hair    Pain, abdominal    Skin cancer, basal cell    Vitamin D deficiency     Past Surgical History:  Procedure Laterality Date   [place]   In [place]   CYSTOURETHROSCOPY  2016   HYSTEROSCOPY  2015   LAPAROSCOPY  03/12/2016   with resection of pelvic adhesions   LAPAROTOMY  1990   post operative infection from cholecystectomy   OOPHORECTOMY Left 2000    Current Medications: Current Meds  Medication Sig   B Complex-C-Folic Acid TABS Take 1 tablet by mouth daily. Unknown  strehgt   Biotin 10 MG TABS Take 1 tablet by mouth daily.   Biotin 5 MG CAPS Take 1 capsule by mouth daily.   Boswellia-Glucosamine-Vit D (OSTEO BI-FLEX ONE PER DAY PO) Take 1 tablet by mouth daily. Unknown strength   calcium carbonate (OS-CAL) 600 MG TABS tablet Take 600 mg by mouth daily.   cetirizine (ZYRTEC) 10 MG tablet Take 10 mg by mouth daily.   Cholecalciferol (VITAMIN D3) 125 MCG (5000 UT) CAPS Take 1 capsule by mouth daily.   Cholecalciferol (VITAMIN D3) 50 MCG (2000 UT) TABS Take 1 tablet by mouth daily.   clobetasol (TEMOVATE) 0.05 % external solution Apply 1 application topically 2 (two) times daily.   Coenzyme Q10 (COQ-10) 100 MG CAPS Take 1 capsule by mouth daily.   estradiol (ESTRACE) 1 MG tablet TAKE ONE TABLET BY MOUTH DAILY WITH LUNCH (Patient taking differently: Take 1 mg by mouth daily. TAKE ONE TABLET BY MOUTH DAILY WITH LUNCH)   Evening Primrose Oil 1000 MG CAPS Take 1 capsule by mouth daily.   finasteride (PROSCAR) 5 MG tablet Take 5 mg by mouth daily.   fluticasone (FLONASE) 50 MCG/ACT nasal spray Place 1 spray into both nostrils as needed for allergies.   hyoscyamine (LEVBID) 0.375 MG 12 hr tablet Take 0.375 mg by  mouth 2 (two) times daily.   ibuprofen (ADVIL,MOTRIN) 200 MG tablet Take 200-600 mg by mouth 2 (two) times daily as needed for mild pain or moderate pain (pain).   Multiple Vitamin (MULTIVITAMIN WITH MINERALS) TABS tablet Take 1 tablet by mouth at bedtime. Unknown strength   Multiple Vitamins-Minerals (MULTI FOR HER 50+) TABS Take 1 tablet by mouth daily.   progesterone (PROMETRIUM) 200 MG capsule Take 1 capsule (200 mg total) by mouth daily.   rosuvastatin (CRESTOR) 5 MG tablet Take 1 tablet (5 mg total) by mouth daily.   vitamin E 400 UNIT capsule Take 400 Units by mouth daily with lunch.   Current Facility-Administered Medications for the 01/29/21 encounter (Office Visit) with Park Liter, MD  Medication   0.9 %  sodium chloride infusion      Allergies:   Diclofenac sodium, Nickel, Other, Oxybutynin chloride er, Trospium, Diclofenac sodium, Oxybutynin chloride, and Sporanox [itraconazole]   Social History   Socioeconomic History   Marital status: Married    Spouse name: Not on file   Number of children: 1   Years of education: Not on file   Highest education level: Not on file  Occupational History   Not on file  Tobacco Use   Smoking status: Former    Packs/day: 1.00    Years: 10.00    Pack years: 10.00    Types: Cigarettes    Quit date: 05/19/2004    Years since quitting: 16.7   Smokeless tobacco: Never  Vaping Use   Vaping Use: Never used  Substance and Sexual Activity   Alcohol use: No   Drug use: No   Sexual activity: Not Currently    Birth control/protection: Post-menopausal  Other Topics Concern   Not on file  Social History Narrative   Not on file   Social Determinants of Health   Financial Resource Strain: Not on file  Food Insecurity: Not on file  Transportation Needs: Not on file  Physical Activity: Not on file  Stress: Not on file  Social Connections: Not on file     Family History: The patient's family history includes Diabetes in her mother; Heart disease in her father; Kidney cancer in her mother; Liver disease in her father. ROS:   Please see the history of present illness.    All 14 point review of systems negative except as described per history of present illness  EKGs/Labs/Other Studies Reviewed:      Recent Labs: 11/12/2020: ALT 19; BUN 16; Creatinine, Ser 0.62; Hemoglobin 13.9; Platelets 288; Potassium 3.8; Sodium 136  Recent Lipid Panel    Component Value Date/Time   CHOL 177 05/16/2019 0754   TRIG 133 05/16/2019 0754   HDL 77 05/16/2019 0754   CHOLHDL 2.3 05/16/2019 0754   LDLCALC 77 05/16/2019 0754    Physical Exam:    VS:  BP 132/88 (BP Location: Right Arm, Patient Position: Sitting)   Pulse 81   Ht '5\' 4"'$  (1.626 m)   Wt 171 lb (77.6 kg)   LMP 02/23/2012    SpO2 97%   BMI 29.35 kg/m     Wt Readings from Last 3 Encounters:  01/29/21 171 lb (77.6 kg)  11/12/20 165 lb (74.8 kg)  03/19/20 163 lb (73.9 kg)     GEN:  Well nourished, well developed in no acute distress HEENT: Normal NECK: No JVD; No carotid bruits LYMPHATICS: No lymphadenopathy CARDIAC: RRR, systolic ejection murmur grade 1/6 best heard right upper portion of the sternum, no rubs,  no gallops RESPIRATORY:  Clear to auscultation without rales, wheezing or rhonchi  ABDOMEN: Soft, non-tender, non-distended MUSCULOSKELETAL:  No edema; No deformity  SKIN: Warm and dry LOWER EXTREMITIES: no swelling NEUROLOGIC:  Alert and oriented x 3 PSYCHIATRIC:  Normal affect   ASSESSMENT:    1. ASD (atrial septal defect)   2. Bicuspid aortic valve   3. High cholesterol   4. Dyspnea on exertion    PLAN:    In order of problems listed above:  Atrial septal defect.  Echocardiogram will be done the purpose of echocardiogram will be to look at the size and function of the right ventricle and right atrium.  We will also look at the pulmonary pressure. Bicuspid aortic valve again echocardiogram will be done heart murmur that I can hear my physical exam is very soft she does have still very loud S2.  However in people with bicuspid arctic valve even with severe stenosis I still cannot be still very well pronounced Dyslipidemia she is taking Crestor 5 mg daily I do have last fasting lipid profile from last year with HDL of 63 LDL 61.  We will continue present management. [place] exertion plan as described above   Medication Adjustments/Labs and Tests Ordered: Current medicines are reviewed at length with the patient today.  Concerns regarding medicines are outlined above.  No orders of the defined types were placed in this encounter.  Medication changes: No orders of the defined types were placed in this encounter.   Signed, Park Liter, MD, [facility] 01/29/2021 4:43 PM    [place]

## 2021-01-29 NOTE — Patient Instructions (Addendum)
Medication Instructions:  Your physician recommends that you continue on your current medications as directed. Please refer to the Current Medication list given to you today.  *If you need a refill on your cardiac medications before your next appointment, please call your pharmacy*   Lab Work: None If you have labs (blood work) drawn today and your tests are completely normal, you will receive your results only by: [place] (if you have MyChart) OR A paper copy in the mail If you have any lab test that is abnormal or we need to change your treatment, we will call you to review the results.   Testing/Procedures: Your physician has requested that you have an echocardiogram. Echocardiography is a painless test that uses sound waves to create images of your heart. It provides your doctor with information about the size and shape of your heart and how well your heart's chambers and valves are working. This procedure takes approximately one hour. There are no restrictions for this procedure.    Follow-Up: At [facility], you and your health needs are our priority.  As part of our continuing mission to provide you with exceptional heart care, we have created designated Provider Care Teams.  These Care Teams include your primary Cardiologist (physician) and Advanced Practice Providers (APPs -  Physician Assistants and Nurse Practitioners) who all work together to provide you with the care you need, when you need it.  We recommend signing up for the patient portal called "MyChart".  Sign up information is provided on this After Visit Summary.  MyChart is used to connect with patients for Virtual Visits (Telemedicine).  Patients are able to view lab/test results, encounter notes, upcoming appointments, etc.  Non-urgent messages can be sent to your provider as well.   To learn more about what you can do with MyChart, go to [email or web address].    Your next appointment:   6 month(s)  The  format for your next appointment:   In Person  Provider:   Jenne Campus, MD   Other Instructions

## 2021-01-31 ENCOUNTER — Other Ambulatory Visit: Payer: Self-pay | Admitting: Obstetrics & Gynecology

## 2021-01-31 DIAGNOSIS — Z1231 Encounter for screening mammogram for malignant neoplasm of breast: Secondary | ICD-10-CM

## 2021-02-18 ENCOUNTER — Other Ambulatory Visit: Payer: Self-pay

## 2021-02-18 ENCOUNTER — Ambulatory Visit (HOSPITAL_COMMUNITY): Payer: 59 | Attending: Cardiovascular Disease

## 2021-02-18 DIAGNOSIS — R0609 Other forms of dyspnea: Secondary | ICD-10-CM

## 2021-02-18 DIAGNOSIS — Q211 Atrial septal defect, unspecified: Secondary | ICD-10-CM | POA: Diagnosis present

## 2021-02-18 DIAGNOSIS — Q231 Congenital insufficiency of aortic valve: Secondary | ICD-10-CM | POA: Diagnosis present

## 2021-02-18 DIAGNOSIS — E7800 Pure hypercholesterolemia, unspecified: Secondary | ICD-10-CM | POA: Diagnosis present

## 2021-02-18 DIAGNOSIS — Q2381 Bicuspid aortic valve: Secondary | ICD-10-CM

## 2021-02-18 LAB — ECHOCARDIOGRAM COMPLETE
AR max vel: 1.22 cm2
AV Area VTI: 1.33 cm2
AV Area mean vel: 1.36 cm2
AV Mean grad: 9 mmHg
AV Peak grad: 17.1 mmHg
Ao pk vel: 2.07 m/s
Area-P 1/2: 1.87 cm2
S' Lateral: 3.1 cm

## 2021-02-21 ENCOUNTER — Telehealth: Payer: Self-pay | Admitting: Emergency Medicine

## 2021-02-21 DIAGNOSIS — R079 Chest pain, unspecified: Secondary | ICD-10-CM

## 2021-02-21 NOTE — Telephone Encounter (Signed)
Called patient to informed of echo results. During call she reports for the past two weeks she hasn't felt good. She said this is new. She has left chest pain that radiates to left arm that is intermittent and shortness of breath on exertion. No swelling in lower extremities. But she does report fatigue/tiredness also. No pain currently while on the phone as it is intermittent. She wants to let Dr. Agustin Cree know about this and wants to know what he recommends. Will check with him.

## 2021-02-21 NOTE — Telephone Encounter (Signed)
Went over instructions with patient. No further questions she is aware of date and time for test.

## 2021-02-21 NOTE — Telephone Encounter (Signed)
   [facility] at [facility] [address], [place], [place] [address] Phone: [phone]    Please arrive 15 minutes prior to your appointment time for registration and insurance purposes.  The test will take approximately 3 to 4 hours to complete; you may bring reading material.  If someone comes with you to your appointment, they will need to remain in the main lobby due to limited space in the testing area. **If you are pregnant or breastfeeding, please notify the nuclear lab prior to your appointment**  How to prepare for your Myocardial Perfusion Test: Do not eat or drink 3 hours prior to your test, except you may have water. Do not consume products containing caffeine (regular or decaffeinated) 12 hours prior to your test. (ex: coffee, chocolate, sodas, tea). Do bring a list of your current medications with you.  If not listed below, you may take your medications as normal.  Do wear comfortable clothes (no dresses or overalls) and walking shoes, tennis shoes preferred (No heels or open toe shoes are allowed). Do NOT wear cologne, perfume, aftershave, or lotions (deodorant is allowed). If these instructions are not followed, your test will have to be rescheduled.  Please report to [address], [address] for your test.  If you have questions or concerns about your appointment, you can call the Nuclear Lab at [phone].  If you cannot keep your appointment, please provide 24 hours notification to the Nuclear Lab, to avoid a possible $50 charge to your account.

## 2021-02-21 NOTE — Addendum Note (Signed)
Addended by: Senaida Ores on: 02/21/2021 04:22 PM   Modules accepted: Orders

## 2021-02-25 ENCOUNTER — Telehealth (HOSPITAL_COMMUNITY): Payer: Self-pay

## 2021-02-25 NOTE — Addendum Note (Signed)
Addended by: Senaida Ores on: 02/25/2021 07:44 AM   Modules accepted: Orders

## 2021-02-25 NOTE — Addendum Note (Signed)
Addended by: Jenne Campus on: 02/25/2021 09:04 AM   Modules accepted: Orders

## 2021-02-25 NOTE — Telephone Encounter (Signed)
Spoke with the patient, detailed instructions given. She stated that she would be here for her test. Asked to call back with any questions. S.Amare Bail EMTP 

## 2021-02-26 ENCOUNTER — Other Ambulatory Visit: Payer: Self-pay

## 2021-02-26 ENCOUNTER — Ambulatory Visit (HOSPITAL_COMMUNITY): Payer: 59 | Attending: Cardiology

## 2021-02-26 DIAGNOSIS — R079 Chest pain, unspecified: Secondary | ICD-10-CM | POA: Diagnosis not present

## 2021-02-26 LAB — MYOCARDIAL PERFUSION IMAGING
Angina Index: 0
Duke Treadmill Score: 9
Estimated workload: 10.2
Exercise duration (min): 8 min
Exercise duration (sec): 30 s
LV dias vol: 62 mL (ref 46–106)
LV sys vol: 27 mL
MPHR: 160 {beats}/min
Nuc Stress EF: 56 %
Peak HR: 146 {beats}/min
Percent HR: 91 %
Rest HR: 75 {beats}/min
Rest Nuclear Isotope Dose: 10.8 mCi
SDS: 8
SRS: 0
SSS: 8
ST Depression (mm): 0 mm
Stress Nuclear Isotope Dose: 31.4 mCi
TID: 0.9

## 2021-02-26 MED ORDER — TECHNETIUM TC 99M TETROFOSMIN IV KIT
31.4000 | PACK | Freq: Once | INTRAVENOUS | Status: AC | PRN
Start: 2021-02-26 — End: 2021-02-26
  Administered 2021-02-26: 31.4 via INTRAVENOUS
  Filled 2021-02-26: qty 32

## 2021-02-26 MED ORDER — TECHNETIUM TC 99M TETROFOSMIN IV KIT
10.8000 | PACK | Freq: Once | INTRAVENOUS | Status: AC | PRN
Start: 2021-02-26 — End: 2021-02-26
  Administered 2021-02-26: 10.8 via INTRAVENOUS
  Filled 2021-02-26: qty 11

## 2021-03-05 ENCOUNTER — Telehealth: Payer: Self-pay

## 2021-03-05 NOTE — Telephone Encounter (Signed)
Called patient. Patient made aware of results. Verbalized understanding. No questions or concerns expressed at this time.

## 2021-03-05 NOTE — Telephone Encounter (Signed)
-----   Message from Park Liter, MD sent at 03/05/2021  2:16 PM EDT ----- Stress test is negative, no evidence of ischemia

## 2021-03-11 ENCOUNTER — Other Ambulatory Visit: Payer: Self-pay

## 2021-03-11 ENCOUNTER — Ambulatory Visit
Admission: RE | Admit: 2021-03-11 | Discharge: 2021-03-11 | Disposition: A | Discharge status: Home / Self Care | Payer: 59 | Source: Ambulatory Visit | Attending: Obstetrics & Gynecology | Admitting: Obstetrics & Gynecology

## 2021-03-11 DIAGNOSIS — Z1231 Encounter for screening mammogram for malignant neoplasm of breast: Secondary | ICD-10-CM

## 2021-03-14 ENCOUNTER — Other Ambulatory Visit (HOSPITAL_COMMUNITY)
Admission: RE | Admit: 2021-03-14 | Discharge: 2021-03-14 | Disposition: A | Discharge status: Home / Self Care | Payer: 59 | Source: Ambulatory Visit | Attending: Obstetrics & Gynecology | Admitting: Obstetrics & Gynecology

## 2021-03-14 ENCOUNTER — Encounter (HOSPITAL_BASED_OUTPATIENT_CLINIC_OR_DEPARTMENT_OTHER): Payer: Self-pay | Admitting: Obstetrics & Gynecology

## 2021-03-14 ENCOUNTER — Other Ambulatory Visit: Payer: Self-pay

## 2021-03-14 ENCOUNTER — Ambulatory Visit (INDEPENDENT_AMBULATORY_CARE_PROVIDER_SITE_OTHER): Payer: 59 | Admitting: Obstetrics & Gynecology

## 2021-03-14 VITALS — BP 147/95 | HR 83 | Ht 63.0 in | Wt 170.0 lb

## 2021-03-14 DIAGNOSIS — Z79890 Hormone replacement therapy (postmenopausal): Secondary | ICD-10-CM | POA: Diagnosis not present

## 2021-03-14 DIAGNOSIS — Z8742 Personal history of other diseases of the female genital tract: Secondary | ICD-10-CM | POA: Diagnosis not present

## 2021-03-14 DIAGNOSIS — Z124 Encounter for screening for malignant neoplasm of cervix: Secondary | ICD-10-CM

## 2021-03-14 DIAGNOSIS — Z86018 Personal history of other benign neoplasm: Secondary | ICD-10-CM

## 2021-03-14 DIAGNOSIS — Q231 Congenital insufficiency of aortic valve: Secondary | ICD-10-CM

## 2021-03-14 DIAGNOSIS — Z01419 Encounter for gynecological examination (general) (routine) without abnormal findings: Secondary | ICD-10-CM

## 2021-03-14 DIAGNOSIS — Z780 Asymptomatic menopausal state: Secondary | ICD-10-CM

## 2021-03-14 DIAGNOSIS — Q211 Atrial septal defect, unspecified: Secondary | ICD-10-CM

## 2021-03-14 MED ORDER — PROGESTERONE 200 MG PO CAPS: 200.0000 mg | ORAL_CAPSULE | Freq: Every day | ORAL | 4 refills | Status: DC

## 2021-03-14 MED ORDER — ESTRADIOL 0.5 MG PO TABS: 0.5000 mg | ORAL_TABLET | Freq: Every day | ORAL | 4 refills | Status: DC

## 2021-03-14 NOTE — Progress Notes (Signed)
60 y.o. G57P1001 Married White or Caucasian female here for annual exam.  Denies vaginal bleeding.  On HRT.  Lowered dosage last year to 1/2 tab, alternating with 1 tab daily.  Has not felt any side effects.  Discussed with pt lowering to 1/2 tab (0.5mg ) daily.  Will change dosage with prescription.    Has hx of bicuspid aortic valve and ASD.  Asked Dr. Meda Coffee last year if any increased clotting risks with HRT exist.  She advised there was not but she may be at a high risk for complication.  Reviewed with pt today.  She desires to continue HRT.    Does have appt with cardiologist next week.  Had echo and stress test.   Husband has a new job and started in July.     Denies vaginal bleeding.  Patient's last menstrual period was 02/23/2012.          Sexually active: No.  The current method of family planning is post menopausal status.    Exercising: "not too much" Smoker:  no  Health Maintenance: Pap:  01/26/2019 Negative, neg HR HPV History of abnormal Pap:  no MMG:  03/11/2021 Negative Colonoscopy:  01/01/2016, follow up 10 years.   BMD:   11/01/2018 Screening Labs: saw Dr. Orland Mustard last week.  Blood work was "fine" per pt.   reports that she quit smoking about 16 years ago. Her smoking use included cigarettes. She has a 10.00 pack-year smoking history. She has never used smokeless tobacco. She reports that she does not drink alcohol and does not use drugs.  Past Medical History:  Diagnosis Date   Allergy    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   Dyspnea    Endometriosis    Hyperlipidemia    Hypertension    IBS (irritable bowel syndrome)    Loss of hair    Pain, abdominal    Skin cancer, basal cell    Vitamin D deficiency     Past Surgical History:  Procedure Laterality Date   [place]   In [place]   CYSTOURETHROSCOPY  2016   HYSTEROSCOPY  2015   LAPAROSCOPY  03/12/2016   with resection of pelvic adhesions    LAPAROTOMY  1990   post operative infection from cholecystectomy   OOPHORECTOMY Left 2000    Current Outpatient Medications  Medication Sig Dispense Refill   B Complex-C-Folic Acid TABS Take 1 tablet by mouth daily. Unknown strehgt     Biotin 10 MG TABS Take 1 tablet by mouth daily.     Biotin 5 MG CAPS Take 1 capsule by mouth daily.     Boswellia-Glucosamine-Vit D (OSTEO BI-FLEX ONE PER DAY PO) Take 1 tablet by mouth daily. Unknown strength     calcium carbonate (OS-CAL) 600 MG TABS tablet Take 600 mg by mouth daily.     cetirizine (ZYRTEC) 10 MG tablet Take 10 mg by mouth daily.     Cholecalciferol (VITAMIN D3) 50 MCG (2000 UT) TABS Take 1 tablet by mouth daily.     clobetasol (TEMOVATE) 0.05 % external solution Apply 1 application topically 2 (two) times daily.  0   Coenzyme Q10 (COQ-10) 100 MG CAPS Take 1 capsule by mouth daily.     Evening Primrose Oil 1000 MG CAPS Take 1 capsule by mouth daily.     finasteride (PROSCAR) 5 MG tablet Take 5 mg by mouth daily.     fluticasone (FLONASE)  50 MCG/ACT nasal spray Place 1 spray into both nostrils as needed for allergies.     hyoscyamine (LEVBID) 0.375 MG 12 hr tablet Take 0.375 mg by mouth 2 (two) times daily.  0   ibuprofen (ADVIL,MOTRIN) 200 MG tablet Take 200-600 mg by mouth 2 (two) times daily as needed for mild pain or moderate pain (pain).     Multiple Vitamin (MULTIVITAMIN WITH MINERALS) TABS tablet Take 1 tablet by mouth at bedtime. Unknown strength     Multiple Vitamins-Minerals (MULTI FOR HER 50+) TABS Take 1 tablet by mouth daily.     rosuvastatin (CRESTOR) 5 MG tablet Take 1 tablet (5 mg total) by mouth daily. 90 tablet 3   Cholecalciferol (VITAMIN D3) 125 MCG (5000 UT) CAPS Take 1 capsule by mouth daily. (Patient not taking: Reported on 03/14/2021)     estradiol (ESTRACE) 0.5 MG tablet Take 1 tablet (0.5 mg total) by mouth daily. 90 tablet 4   progesterone (PROMETRIUM) 200 MG capsule Take 1 capsule (200 mg total) by mouth daily.  90 capsule 4   vitamin E 400 UNIT capsule Take 400 Units by mouth daily with lunch.     Current Facility-Administered Medications  Medication Dose Route Frequency Provider Last Rate Last Admin   0.9 %  sodium chloride infusion  500 mL Intravenous Continuous Ladene Artist, MD        Family History  Problem Relation Age of Onset   Heart disease Father    Liver disease Father    Diabetes Mother    Kidney cancer Mother     Review of Systems  All other systems reviewed and are negative.  Exam:   LMP 02/23/2012      General appearance: alert, cooperative and appears stated age Head: Normocephalic, without obvious abnormality, atraumatic Neck: no adenopathy, supple, symmetrical, trachea midline and thyroid normal to inspection and palpation Lungs: clear to auscultation bilaterally Breasts: normal appearance, no masses or tenderness Heart: regular rate and rhythm Abdomen: soft, non-tender; bowel sounds normal; no masses,  no organomegaly Extremities: extremities normal, atraumatic, no cyanosis or edema Skin: Skin color, texture, turgor normal. No rashes or lesions Lymph nodes: Cervical, supraclavicular, and axillary nodes normal. No abnormal inguinal nodes palpated Neurologic: Grossly normal   Pelvic: External genitalia:  no lesions              Urethra:  normal appearing urethra with no masses, tenderness or lesions              Bartholins and Skenes: normal                 Vagina: normal appearing vagina with normal color and no discharge, no lesions              Cervix: no lesions              Pap taken: No. Bimanual Exam:  Uterus:  normal size, contour, position, consistency, mobility, non-tender              Adnexa: normal adnexa and no mass, fullness, tenderness               Rectovaginal: Confirms               Anus:  normal sphincter tone, no lesions  Chaperone, Octaviano Batty, CMA, was present for exam.  Assessment/Plan: 1. Well woman exam with routine  gynecological exam - Cytology - PAP( [place] [facility]) - MMG 02/2021 - colonoscoyp 2017, follow up 10 years -  BMD 10/2018 - lab work with Dr. Orland Mustard last week - care gaps updated, reviewed  2. Postmenopausal  3. Hormone replacement therapy (HRT) - estradiol (ESTRACE) 0.5 MG tablet; Take 1 tablet (0.5 mg total) by mouth daily.  Dispense: 90 tablet; Refill: 4 - progesterone (PROMETRIUM) 200 MG capsule; Take 1 capsule (200 mg total) by mouth daily.  Dispense: 90 capsule; Refill: 4  4. History of endometriosis  5. History of uterine fibroid  6. Bicuspid aortic valve  7. ASD (atrial septal defect)

## 2021-03-18 LAB — CYTOLOGY - PAP
Adequacy: ABSENT
Diagnosis: NEGATIVE

## 2021-03-20 ENCOUNTER — Other Ambulatory Visit: Payer: Self-pay

## 2021-03-20 ENCOUNTER — Encounter: Payer: Self-pay | Admitting: Cardiology

## 2021-03-20 ENCOUNTER — Ambulatory Visit (INDEPENDENT_AMBULATORY_CARE_PROVIDER_SITE_OTHER): Payer: 59 | Admitting: Cardiology

## 2021-03-20 VITALS — BP 138/72 | HR 73 | Ht 63.0 in | Wt 170.0 lb

## 2021-03-20 DIAGNOSIS — I10 Essential (primary) hypertension: Secondary | ICD-10-CM | POA: Diagnosis not present

## 2021-03-20 DIAGNOSIS — Q231 Congenital insufficiency of aortic valve: Secondary | ICD-10-CM

## 2021-03-20 DIAGNOSIS — Q211 Atrial septal defect, unspecified: Secondary | ICD-10-CM

## 2021-03-20 DIAGNOSIS — R0609 Other forms of dyspnea: Secondary | ICD-10-CM

## 2021-03-20 DIAGNOSIS — R0789 Other chest pain: Secondary | ICD-10-CM

## 2021-03-20 DIAGNOSIS — R079 Chest pain, unspecified: Secondary | ICD-10-CM | POA: Diagnosis not present

## 2021-03-20 HISTORY — DX: Other chest pain: R07.89

## 2021-03-20 NOTE — Patient Instructions (Addendum)
Medication Instructions:  Your physician recommends that you continue on your current medications as directed. Please refer to the Current Medication list given to you today.  *If you need a refill on your cardiac medications before your next appointment, please call your pharmacy*   Lab Work: Your physician recommends that you return for lab work in: CBC , BMP and D Dimer today  If you have labs (blood work) drawn today and your tests are completely normal, you will receive your results only by: [place] (if you have [place]) OR A paper copy in the mail If you have any lab test that is abnormal or we need to change your treatment, we will call you to review the results.   Testing/Procedures: Your physician has requested that you have a cardiac MRI. Cardiac MRI uses a computer to create images of your heart as its beating, producing both still and moving pictures of your heart and major blood vessels. For further information please visit [email or web address] Please follow the instruction sheet given to you today for more information.    Follow-Up: At [facility], you and your health needs are our priority.  As part of our continuing mission to provide you with exceptional heart care, we have created designated Provider Care Teams.  These Care Teams include your primary Cardiologist (physician) and Advanced Practice Providers (APPs -  Physician Assistants and Nurse Practitioners) who all work together to provide you with the care you need, when you need it.  We recommend signing up for the patient portal called "MyChart".  Sign up information is provided on this After Visit Summary.  MyChart is used to connect with patients for Virtual Visits (Telemedicine).  Patients are able to view lab/test results, encounter notes, upcoming appointments, etc.  Non-urgent messages can be sent to your provider as well.   To learn more about what you can do with MyChart, go to [email or web address].     Your next appointment:   2 month(s)  The format for your next appointment:   In Person  Provider:   Jenne Campus, MD   Other Instructions

## 2021-03-20 NOTE — Progress Notes (Signed)
Cardiology Office Note:    Date:  03/20/2021   ID:  [name], DOB April 23, 1961, MRN [id number]  PCP:  London Pepper, MD  Cardiologist:  Jenne Campus, MD    Referring MD: London Pepper, MD   Chief Complaint  Patient presents with   Shortness of Breath   HR racing    Chest Pain    History of Present Illness:    Robena Ewy is a 60 y.o. female with past medical history significant for questionable bicuspid aortic valve, ascending aortic enlargement measuring 40 mm, ASD, essential hypertension, atypical chest pain.  She did have echocardiogram repeated which showed preserved left ventricle ejection fraction, aorta is measuring 40 mm however she still having also some chest pain.  She did have coronary CT angiogram 2 years ago which was negative.  This time we elected to proceed with stress testing which was negative as well.  She describes some sensation of the left side of her chest slightly worse with taking deep breath or cough.  She also complained of having shortness of breath.  There was no swelling of lower extremities there is no tenderness in the calf.  Overall she started getting better from that point review.  Now she is doing well.  Past Medical History:  Diagnosis Date   Allergy    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   Dyspnea    Endometriosis    Hyperlipidemia    Hypertension    IBS (irritable bowel syndrome)    Loss of hair    Pain, abdominal    Skin cancer, basal cell    Vitamin D deficiency     Past Surgical History:  Procedure Laterality Date   [place]   In [place]   CYSTOURETHROSCOPY  2016   HYSTEROSCOPY  2015   LAPAROSCOPY  03/12/2016   with resection of pelvic adhesions   LAPAROTOMY  1990   post operative infection from cholecystectomy   OOPHORECTOMY Left 2000    Current Medications: Current Meds  Medication Sig   B Complex-C-Folic Acid TABS Take 1 tablet by mouth  daily. Unknown strehgt   Biotin 10 MG TABS Take 1 tablet by mouth daily.   Biotin 5 MG CAPS Take 1 capsule by mouth daily.   Boswellia-Glucosamine-Vit D (OSTEO BI-FLEX ONE PER DAY PO) Take 1 tablet by mouth daily. Unknown strength   calcium carbonate (OS-CAL) 600 MG TABS tablet Take 600 mg by mouth daily.   cetirizine (ZYRTEC) 10 MG tablet Take 10 mg by mouth daily.   Cholecalciferol (VITAMIN D3) 50 MCG (2000 UT) TABS Take 1 tablet by mouth daily.   clobetasol (TEMOVATE) 0.05 % external solution Apply 1 application topically 2 (two) times daily.   Coenzyme Q10 (COQ-10) 100 MG CAPS Take 1 capsule by mouth daily.   estradiol (ESTRACE) 0.5 MG tablet Take 1 tablet (0.5 mg total) by mouth daily.   Evening Primrose Oil 1000 MG CAPS Take 1 capsule by mouth daily.   finasteride (PROSCAR) 5 MG tablet Take 5 mg by mouth daily.   fluticasone (FLONASE) 50 MCG/ACT nasal spray Place 1 spray into both nostrils as needed for allergies.   hyoscyamine (LEVBID) 0.375 MG 12 hr tablet Take 0.375 mg by mouth 2 (two) times daily.   ibuprofen (ADVIL,MOTRIN) 200 MG tablet Take 200-600 mg by mouth 2 (two) times daily as needed for mild pain or moderate pain (pain).   Multiple Vitamin (MULTIVITAMIN WITH  MINERALS) TABS tablet Take 1 tablet by mouth at bedtime. Unknown strength   Multiple Vitamins-Minerals (MULTI FOR HER 50+) TABS Take 1 tablet by mouth daily. Unknown strenght   progesterone (PROMETRIUM) 200 MG capsule Take 1 capsule (200 mg total) by mouth daily.   rosuvastatin (CRESTOR) 5 MG tablet Take 1 tablet (5 mg total) by mouth daily.   vitamin E 400 UNIT capsule Take 400 Units by mouth daily with lunch.   [DISCONTINUED] Cholecalciferol (VITAMIN D3) 125 MCG (5000 UT) CAPS Take 1 capsule by mouth daily.   Current Facility-Administered Medications for the 03/20/21 encounter (Office Visit) with Park Liter, MD  Medication   0.9 %  sodium chloride infusion     Allergies:   Nickel, Other, Oxybutynin chloride  er, Trospium, Diclofenac sodium, and Sporanox [itraconazole]   Social History   Socioeconomic History   Marital status: Married    Spouse name: Not on file   Number of children: 1   Years of education: Not on file   Highest education level: Not on file  Occupational History   Not on file  Tobacco Use   Smoking status: Former    Packs/day: 1.00    Years: 10.00    Pack years: 10.00    Types: Cigarettes    Quit date: 05/19/2004    Years since quitting: 16.8   Smokeless tobacco: Never  Vaping Use   Vaping Use: Never used  Substance and Sexual Activity   Alcohol use: No   Drug use: No   Sexual activity: Not Currently    Birth control/protection: Post-menopausal  Other Topics Concern   Not on file  Social History Narrative   Not on file   Social Determinants of Health   Financial Resource Strain: Not on file  Food Insecurity: Not on file  Transportation Needs: Not on file  Physical Activity: Not on file  Stress: Not on file  Social Connections: Not on file     Family History: The patient's family history includes Diabetes in her mother; Heart disease in her father; Kidney cancer in her mother; Liver disease in her father. ROS:   Please see the history of present illness.    All 14 point review of systems negative except as described per history of present illness  EKGs/Labs/Other Studies Reviewed:      Recent Labs: 11/12/2020: ALT 19; BUN 16; Creatinine, Ser 0.62; Hemoglobin 13.9; Platelets 288; Potassium 3.8; Sodium 136  Recent Lipid Panel    Component Value Date/Time   CHOL 177 05/16/2019 0754   TRIG 133 05/16/2019 0754   HDL 77 05/16/2019 0754   CHOLHDL 2.3 05/16/2019 0754   LDLCALC 77 05/16/2019 0754    Physical Exam:    VS:  BP 138/72 (BP Location: Right Arm, Patient Position: Sitting)   Pulse 73   Ht 5\' 3"  (1.6 m)   Wt 170 lb (77.1 kg)   LMP 02/23/2012   SpO2 97%   BMI 30.11 kg/m     Wt Readings from Last 3 Encounters:  03/20/21 170 lb (77.1  kg)  03/14/21 170 lb (77.1 kg)  02/26/21 171 lb (77.6 kg)     GEN:  Well nourished, well developed in no acute distress HEENT: Normal NECK: No JVD; No carotid bruits LYMPHATICS: No lymphadenopathy CARDIAC: RRR, no murmurs, no rubs, no gallops RESPIRATORY:  Clear to auscultation without rales, wheezing or rhonchi  ABDOMEN: Soft, non-tender, non-distended MUSCULOSKELETAL:  No edema; No deformity  SKIN: Warm and dry LOWER EXTREMITIES: no swelling  NEUROLOGIC:  Alert and oriented x 3 PSYCHIATRIC:  Normal affect   ASSESSMENT:    1. Chest pain, unspecified type   2. ASD (atrial septal defect)   3. Bicuspid aortic valve   4. Primary hypertension   5. Dyspnea on exertion   6. Atypical chest pain    PLAN:    In order of problems listed above:  Chest pain.  Stress test negative.  Coronary CT angio from 2 years ago did not show any significant blockages evidently we dealing with coronary disease.  1 to make me worry is possibility of PE.  Overall she seems like she is doing much better but I will check her D-dimer if D-dimer is very high then we will do a CT of her chest.  If not we will continue present management. ASD present.  With her shortness of breath.  Worrying about significance of this finding.  Echocardiogram did not say anything about enlargement of the atrium or ventricle.  However the proper way to assess this will be to do MRI with Qp/Qs assessment.  We will schedule her to have it. Bicuspid aortic valve.  Echocardiogram reviewed questionable bicuspid arctic valve however likely no significant stenosis. Dyspnea on exertion concerning.  Plan as described above.  We will do MRI for her ASD and will do D-dimer for ruling out PE.    Medication Adjustments/Labs and Tests Ordered: Current medicines are reviewed at length with the patient today.  Concerns regarding medicines are outlined above.  Orders Placed This Encounter  Procedures   MR CARDIAC MORPHOLOGY WO CONTRAST    D-Dimer, Quantitative   CBC   Basic metabolic panel   Medication changes: No orders of the defined types were placed in this encounter.   Signed, Park Liter, MD, [facility] 03/20/2021 2:31 PM    [place]

## 2021-03-21 LAB — BASIC METABOLIC PANEL
BUN/Creatinine Ratio: 33 — ABNORMAL HIGH (ref 12–28)
BUN: 25 mg/dL (ref 8–27)
CO2: 24 mmol/L (ref 20–29)
Calcium: 10.2 mg/dL (ref 8.7–10.3)
Chloride: 104 mmol/L (ref 96–106)
Creatinine, Ser: 0.75 mg/dL (ref 0.57–1.00)
Glucose: 101 mg/dL — ABNORMAL HIGH (ref 70–99)
Potassium: 4.6 mmol/L (ref 3.5–5.2)
Sodium: 144 mmol/L (ref 134–144)
eGFR: 91 mL/min/{1.73_m2} (ref >59)

## 2021-03-21 LAB — CBC
Hematocrit: 40.4 % (ref 34.0–46.6)
Hemoglobin: 13.7 g/dL (ref 11.1–15.9)
MCH: 30.8 pg (ref 26.6–33.0)
MCHC: 33.9 g/dL (ref 31.5–35.7)
MCV: 91 fL (ref 79–97)
Platelets: 266 10*3/uL (ref 150–450)
RBC: 4.45 x10E6/uL (ref 3.77–5.28)
RDW: 12 % (ref 11.7–15.4)
WBC: 6.1 10*3/uL (ref 3.4–10.8)

## 2021-03-21 LAB — D-DIMER, QUANTITATIVE: D-DIMER: <0.2 mg/L FEU (ref 0.00–0.49)

## 2021-03-29 ENCOUNTER — Telehealth (HOSPITAL_COMMUNITY): Payer: Self-pay | Admitting: *Deleted

## 2021-03-29 NOTE — Telephone Encounter (Signed)
Patient returning call regarding upcoming cardiac imaging study; pt verbalizes understanding of appt date/time, parking situation and where to check in, and verified current allergies; name and call back number provided for further questions should they arise  Gordy Clement RN Navigator Cardiac [place] [facility] [phone] office [phone] cell  Patient had an MRI 10 years ago and reports she had a bad time but was able to complete scan.  I offered to ask Dr. Agustin Cree for anti-anxiety medications but she declined since she wasn't able to get a ride.

## 2021-03-29 NOTE — Telephone Encounter (Signed)
Attempted to call patient regarding upcoming cardiac MRI appointment. Left message on voicemail with name and callback number  Lebron Nauert RN Navigator Cardiac Imaging [facility] [phone] Office [phone] Cell  

## 2021-04-01 ENCOUNTER — Ambulatory Visit (HOSPITAL_COMMUNITY)
Admission: RE | Admit: 2021-04-01 | Discharge: 2021-04-01 | Disposition: A | Discharge status: Home / Self Care | Payer: 59 | Source: Ambulatory Visit | Attending: Cardiology | Admitting: Cardiology

## 2021-04-01 ENCOUNTER — Other Ambulatory Visit: Payer: Self-pay

## 2021-04-01 DIAGNOSIS — R079 Chest pain, unspecified: Secondary | ICD-10-CM | POA: Insufficient documentation

## 2021-04-01 DIAGNOSIS — Q211 Atrial septal defect, unspecified: Secondary | ICD-10-CM

## 2021-04-01 MED ORDER — GADOBUTROL 1 MMOL/ML IV SOLN
8.0000 mL | Freq: Once | INTRAVENOUS | Status: AC | PRN
  Administered 2021-04-01: 8 mL via INTRAVENOUS

## 2021-04-08 ENCOUNTER — Telehealth: Payer: Self-pay | Admitting: Cardiology

## 2021-04-08 NOTE — Telephone Encounter (Signed)
Patient is calling requesting her MRI results.

## 2021-04-08 NOTE — Telephone Encounter (Signed)
Results reviewed with pt as per Dr. Krasowski's note.  Pt verbalized understanding and had no additional questions. Routed to PCP  

## 2021-04-16 ENCOUNTER — Other Ambulatory Visit: Payer: Self-pay

## 2021-04-16 DIAGNOSIS — R1031 Right lower quadrant pain: Secondary | ICD-10-CM | POA: Insufficient documentation

## 2021-04-16 HISTORY — DX: Right lower quadrant pain: R10.31

## 2021-04-19 ENCOUNTER — Encounter: Payer: Self-pay | Admitting: Cardiology

## 2021-04-19 ENCOUNTER — Ambulatory Visit (INDEPENDENT_AMBULATORY_CARE_PROVIDER_SITE_OTHER): Payer: 59 | Admitting: Cardiology

## 2021-04-19 ENCOUNTER — Ambulatory Visit (HOSPITAL_BASED_OUTPATIENT_CLINIC_OR_DEPARTMENT_OTHER)
Admission: RE | Admit: 2021-04-19 | Discharge: 2021-04-19 | Disposition: A | Discharge status: Home / Self Care | Payer: 59 | Source: Ambulatory Visit | Attending: Cardiology | Admitting: Cardiology

## 2021-04-19 ENCOUNTER — Other Ambulatory Visit: Payer: Self-pay

## 2021-04-19 VITALS — BP 138/74 | HR 77 | Ht 62.0 in | Wt 172.0 lb

## 2021-04-19 DIAGNOSIS — R0609 Other forms of dyspnea: Secondary | ICD-10-CM | POA: Insufficient documentation

## 2021-04-19 DIAGNOSIS — R0789 Other chest pain: Secondary | ICD-10-CM

## 2021-04-19 DIAGNOSIS — Q231 Congenital insufficiency of aortic valve: Secondary | ICD-10-CM | POA: Diagnosis not present

## 2021-04-19 DIAGNOSIS — I10 Essential (primary) hypertension: Secondary | ICD-10-CM

## 2021-04-19 DIAGNOSIS — Q211 Atrial septal defect, unspecified: Secondary | ICD-10-CM | POA: Insufficient documentation

## 2021-04-19 MED ORDER — ASPIRIN EC 81 MG PO TBEC: 81.0000 mg | DELAYED_RELEASE_TABLET | Freq: Every day | ORAL | 3 refills | Status: AC

## 2021-04-19 MED ORDER — METOPROLOL TARTRATE 25 MG PO TABS: 25.0000 mg | ORAL_TABLET | Freq: Two times a day (BID) | ORAL | 1 refills | Status: DC

## 2021-04-19 NOTE — Patient Instructions (Signed)
Medication Instructions:  Your physician has recommended you make the following change in your medication:  START: Aspirin 81 mg daily  START: Metoprolol tartrate 25 mg twice daily.   *If you need a refill on your cardiac medications before your next appointment, please call your pharmacy*   Lab Work: Your physician recommends that you return for lab work today: bmp, cbc   If you have labs (blood work) drawn today and your tests are completely normal, you will receive your results only by: MyChart Message (if you have MyChart) OR A paper copy in the mail If you have any lab test that is abnormal or we need to change your treatment, we will call you to review the results.   Testing/Procedures: A chest x-ray takes a picture of the organs and structures inside the chest, including the heart, lungs, and blood vessels. This test can show several things, including, whether the heart is enlarges; whether fluid is building up in the lungs; and whether pacemaker / defibrillator leads are still in place.   [facility] [place], [place] [address] Dept: [phone] Loc: [phone]  Kristin [name]  04/19/2021  You are scheduled for a Cardiac Catheterization on Tuesday, December 6 with Dr. Daneen Schick.  1. Please arrive at the [facility] (Main Entrance A) at [facility]: [address] [place],  [address] at 8:30 AM (This time is two hours before your procedure to ensure your preparation). Free valet parking service is available.   Special note: Every effort is made to have your procedure done on time. Please understand that emergencies sometimes delay scheduled procedures.  2. Diet: Do not eat solid foods after midnight.  The patient may have clear liquids until 5am upon the day of the procedure.  3. Labs: You will have blood drawn today.  4. Medication instructions in  preparation for your procedure:   Contrast Allergy: No    On the morning of your procedure, take your Aspirin and any morning medicines NOT listed above.  You may use sips of water.  5. Plan for one night stay--bring personal belongings. 6. Bring a current list of your medications and current insurance cards. 7. You MUST have a responsible person to drive you home. 8. Someone MUST be with you the first 24 hours after you arrive home or your discharge will be delayed. 9. Please wear clothes that are easy to get on and off and wear slip-on shoes.  Thank you for allowing [place] to care for you!   -- [facility]    Follow-Up: At [facility], you and your health needs are our priority.  As part of our continuing mission to provide you with exceptional heart care, we have created designated Provider Care Teams.  These Care Teams include your primary Cardiologist (physician) and Advanced Practice Providers (APPs -  Physician Assistants and Nurse Practitioners) who all work together to provide you with the care you need, when you need it.  We recommend signing up for the patient portal called "MyChart".  Sign up information is provided on this After Visit Summary.  MyChart is used to connect with patients for Virtual Visits (Telemedicine).  Patients are able to view lab/test results, encounter notes, upcoming appointments, etc.  Non-urgent messages can be sent to your provider as well.   To learn more about what you can do with MyChart, go to [email or web address].  Your next appointment:   1 month(s)  The format for your next appointment:   In Person  Provider:   Jenne Campus, MD    Other Instructions  Aspirin and Your Heart Aspirin is a medicine that prevents the platelets in your blood from sticking together. Platelets are the cells that your blood uses for clotting. Aspirin can be used to help reduce the risk of blood clots, heart attacks, and  other heart-related problems. What are the risks? Daily use of aspirin can cause side effects. Some of these include: Bleeding. Bleeding can be minor or serious. An example of minor bleeding is bleeding from a cut, and the bleeding does not stop. An example of more serious bleeding is stomach bleeding or, rarely, bleeding into the brain. Your risk of bleeding increases if you are also taking NSAIDs, such as ibuprofen. Increased bruising. Upset stomach. An allergic reaction. People who have growths inside the nose (nasal polyps) have an increased risk of developing an aspirin allergy. How to use aspirin to care for your heart  Take aspirin only as told by your health care provider. Make sure that you understand how much to take and what form to take. The two forms of aspirin are: Non-enteric-coated.This type of aspirin does not have a coating and is absorbed quickly. This type of aspirin also comes in a chewable form. Enteric-coated. This type of aspirin has a coating that releases the medicine very slowly. Enteric-coated aspirin might cause less stomach upset than non-enteric-coated aspirin. This type of aspirin should not be chewed or crushed. Work with your health care provider to find out whether it is safe and beneficial for you to take aspirin daily. Taking aspirin daily may be helpful if: You have had a heart attack or chest pain, or you are at risk for a heart attack. You have a condition in which certain heart vessels are blocked (coronary artery disease), and you have had a procedure to treat it. Examples are: Open-heart surgery, such as coronary artery bypass surgery (CABG). Coronary angioplasty,which is done to widen a blood vessel of your heart. Having a small mesh tube, or stent, placed in your coronary artery. You have had certain types of stroke or a mini-stroke known as a transient ischemic attack (TIA). You have a narrowing of the arteries that supply the limbs (peripheral  vascular disease, or PVD). You have long-term (chronic) heart rhythm problems, such as atrial fibrillation, and your health care provider thinks aspirin may help. You have valve disease, have had a heart valve replacement, or have had surgery on a valve. You are considered at increased risk of developing coronary artery disease or PVD. Follow these instructions at home Medicines Take over-the-counter and prescription medicines only as told by your health care provider. If you are taking blood thinners: Talk with your health care provider before you take any medicines that contain aspirin or NSAIDs, such as ibuprofen. These medicines increase your risk for dangerous bleeding. Take your medicine exactly as told, at the same time every day. Avoid activities that could cause injury or bruising, and follow instructions about how to prevent falls. Wear a medical alert bracelet or carry a card that lists what medicines you take. General instructions Do not drink alcohol if: Your health care provider tells you not to drink. You are pregnant, may be pregnant, or are planning to become pregnant. If you drink alcohol: Limit how much you have to: 0-1 drink a day for women. 0-2 drinks a day for  men. Know how much alcohol is in your drink. In the [place] one drink equals one 12 oz bottle of beer (355 mL), one 5 oz glass of wine (148 mL), or one 1 oz glass of hard liquor (44 mL). Keep all follow-up visits. This is important. Where to find more information The American Heart Association: www.heart.org The Centers for Disease Control and Prevention: http://www.wolf.info/ Contact a health care provider if: You have unusual bleeding or bruising. You have stomach pain or you feel nauseous. You have ringing in your ears. You have an allergic reaction that causes hives, itchy skin, or swelling of the lips, tongue, or face. Get help right away if: Your bowel movements are bloody, dark red, or black. You vomit or cough  up blood. You have blood in your urine. You have a cough, make high-pitched whistling sounds most often heard when you breathe out (wheeze), or feel short of breath. You have chest pain, especially if the pain spreads to your arms, back, neck, or jaw. You have any symptoms of a stroke. "BE FAST" is an easy way to remember the main warning signs of a stroke: B - Balance. Signs are dizziness, sudden trouble walking, or loss of balance. E - Eyes. Signs are trouble seeing or a sudden change in vision. F - Face. Signs are sudden weakness or numbness of the face, or the face or eyelid drooping on one side. A - Arms. Signs are weakness or numbness in an arm. This happens suddenly and usually on one side of the body. S - Speech. Signs are sudden trouble speaking, slurred speech, or trouble understanding what people say. T - Time. Time to call emergency services. Write down what time symptoms started. You have other signs of a stroke, such as: A sudden, severe headache with no known cause. Confusion. Nausea or vomiting. Seizure. These symptoms may represent a serious problem that is an emergency. Do not wait to see if the symptoms will go away. Get medical help right away. Call your local emergency services (911 in the [place] Do not drive yourself to the hospital. Summary Aspirin use can help reduce the risk of blood clots, heart attacks, and other heart-related problems. Daily use of aspirin can cause side effects. Take aspirin only as told by your health care provider. Make sure that you understand how much to take and what form to take. Your health care provider will help you determine whether it is safe and beneficial for you to take aspirin daily. This information is not intended to replace advice given to you by your health care provider. Make sure you discuss any questions you have with your health care provider. Document Revised: 07/07/2020 Document Reviewed: 07/07/2020 Elsevier Patient  Education  [employer].  Metoprolol Tablets What is this medication? METOPROLOL (me TOE proe lole) treats high blood pressure. It also prevents chest pain (angina) or further damage after a heart attack. It works by lowering your blood pressure and heart rate, making it easier for your heart to pump blood to the rest of your body. It belongs to a group of medications called beta blockers. This medicine may be used for other purposes; ask your health care provider or pharmacist if you have questions. COMMON BRAND NAME(S): Lopressor What should I tell my care team before I take this medication? They need to know if you have any of these conditions: Diabetes Heart or vessel disease like slow heart rate, worsening heart failure, heart block, sick sinus syndrome, or  Raynaud's disease Kidney disease Liver disease Lung or breathing disease, like asthma or emphysema Pheochromocytoma Thyroid disease An unusual or allergic reaction to metoprolol, other beta blockers, medications, foods, dyes, or preservatives Pregnant or trying to get pregnant Breast-feeding How should I use this medication? Take this medication by mouth with water. Take it as directed on the prescription label at the same time every day. You can take it with or without food. You should always take it the same way. Keep taking it unless your care team tells you to stop. Talk to your care team about the use of this medication in children. Special care may be needed. Overdosage: If you think you have taken too much of this medicine contact a poison control center or emergency room at once. NOTE: This medicine is only for you. Do not share this medicine with others. What if I miss a dose? If you miss a dose, take it as soon as you can. If it is almost time for your next dose, take only that dose. Do not take double or extra doses. What may interact with this medication? This medication may interact with the following: Certain  medications for blood pressure, heart disease, irregular heartbeat Certain medications for depression like monoamine oxidase (MAO) inhibitors, fluoxetine, or paroxetine Clonidine Dobutamine Epinephrine Isoproterenol Reserpine This list may not describe all possible interactions. Give your health care provider a list of all the medicines, herbs, non-prescription drugs, or dietary supplements you use. Also tell them if you smoke, drink alcohol, or use illegal drugs. Some items may interact with your medicine. What should I watch for while using this medication? Visit your care team for regular checks on your progress. Check your blood pressure as directed. Ask your care team what your blood pressure should be. Also, find out when you should contact them. Do not treat yourself for coughs, colds, or pain while you are using this medication without asking your care team for advice. Some medications may increase your blood pressure. You may get drowsy or dizzy. Do not drive, use machinery, or do anything that needs mental alertness until you know how this medication affects you. Do not stand up or sit up quickly, especially if you are an older patient. This reduces the risk of dizzy or fainting spells. Alcohol may interfere with the effect of this medication. Avoid alcoholic drinks. This medication may increase blood sugar. Ask your care team if changes in diet or medications are needed if you have diabetes. What side effects may I notice from receiving this medication? Side effects that you should report to your care team as soon as possible: Allergic reactions--skin rash, itching, hives, swelling of the face, lips, tongue, or throat Heart failure--shortness of breath, swelling of the ankles, feet, or hands, sudden weight gain, unusual weakness or fatigue Low blood pressure--dizziness, feeling faint or lightheaded, blurry vision Raynaud's--cool, numb, or painful fingers or toes that may change color  from pale, to blue, to red Slow heartbeat--dizziness, feeling faint or lightheaded, confusion, trouble breathing, unusual weakness or fatigue Worsening mood, feelings of depression Side effects that usually do not require medical attention (report to your care team if they continue or are bothersome): Change in sex drive or performance Diarrhea Dizziness Fatigue Headache This list may not describe all possible side effects. Call your doctor for medical advice about side effects. You may report side effects to FDA at 1-800-FDA-1088. Where should I keep my medication? Keep out of the reach of children and pets. Store  at room temperature between 15 and 30 degrees C (59 and 86 degrees F). Protect from moisture. Keep the container tightly closed. Throw away any unused medication after the expiration date. NOTE: This sheet is a summary. It may not cover all possible information. If you have questions about this medicine, talk to your doctor, pharmacist, or health care provider.  2022 Elsevier/Gold Standard (2021-01-22 00:00:00)   Coronary Angiogram With Stent Coronary angiogram with stent placement is a procedure to widen or open a narrow blood vessel of the heart (coronary artery). Arteries may become blocked by cholesterol buildup (plaques) in the lining of the artery wall. When a coronary artery becomes partially blocked, blood flow to that area decreases. This may lead to chest pain or a heart attack (myocardial infarction). A stent is a small piece of metal that looks like mesh or spring. Stent placement may be done as treatment after a heart attack, or to prevent a heart attack if a blocked artery is found by a coronary angiogram. Let your health care provider know about: Any allergies you have, including allergies to medicines or contrast dye. All medicines you are taking, including vitamins, herbs, eye drops, creams, and over-the-counter medicines. Any problems you or family members have  had with anesthetic medicines. Any blood disorders you have. Any surgeries you have had. Any medical conditions you have, including kidney problems or kidney failure. Whether you are pregnant or may be pregnant. Whether you are breastfeeding. What are the risks? Generally, this is a safe procedure. However, serious problems may occur, including: Damage to nearby structures or organs, such as the heart, blood vessels, or kidneys. A return of blockage. Bleeding, infection, or bruising at the insertion site. A collection of blood under the skin (hematoma) at the insertion site. A blood clot in another part of the body. Allergic reaction to medicines or dyes. Bleeding into the abdomen (retroperitoneal bleeding). Stroke (rare). Heart attack (rare). What happens before the procedure? Staying hydrated Follow instructions from your health care provider about hydration, which may include: Up to 2 hours before the procedure - you may continue to drink clear liquids, such as water, clear fruit juice, black coffee, and plain tea.  Eating and drinking restrictions Follow instructions from your health care provider about eating and drinking, which may include: 8 hours before the procedure - stop eating heavy meals or foods, such as meat, fried foods, or fatty foods. 6 hours before the procedure - stop eating light meals or foods, such as toast or cereal. 2 hours before the procedure - stop drinking clear liquids. Medicines Ask your health care provider about: Changing or stopping your regular medicines. This is especially important if you are taking diabetes medicines or blood thinners. Taking medicines such as aspirin and ibuprofen. These medicines can thin your blood. Do not take these medicines unless your health care provider tells you to take them. Generally, aspirin is recommended before a thin tube, called a catheter, is passed through a blood vessel and inserted into the heart (cardiac  catheterization). Taking over-the-counter medicines, vitamins, herbs, and supplements. General instructions Do not use any products that contain nicotine or tobacco for at least 4 weeks before the procedure. These products include cigarettes, e-cigarettes, and chewing tobacco. If you need help quitting, ask your health care provider. Plan to have someone take you home from the hospital or clinic. If you will be going home right after the procedure, plan to have someone with you for 24 hours. You may have  tests and imaging procedures. Ask your health care provider: How your insertion site will be marked. Ask which artery will be used for the procedure. What steps will be taken to help prevent infection. These may include: Removing hair at the insertion site. Washing skin with a germ-killing soap. Taking antibiotic medicine. What happens during the procedure?  An IV will be inserted into one of your veins. Electrodes may be placed on your chest to monitor your heart rate during the procedure. You will be given one or more of the following: A medicine to help you relax (sedative). A medicine to numb the area (local anesthetic) for catheter insertion. A small incision will be made for catheter insertion. The catheter will be inserted into an artery using a guide wire. The location may be in your groin, your wrist, or the fold of your arm (near your elbow). An X-ray procedure (fluoroscopy) will be used to help guide the catheter to the opening of the heart arteries. A dye will be injected into the catheter. X-rays will be taken. The dye helps to show where any narrowing or blockages are located in the arteries. Tell your health care provider if you have chest pain or trouble breathing. A tiny wire will be guided to the blocked spot, and a balloon will be inflated to make the artery wider. The stent will be expanded to crush the plaques into the wall of the vessel. The stent will hold the area  open and improve the blood flow. Most stents have a drug coating to reduce the risk of the stent narrowing over time. The artery may be made wider using a drill, laser, or other tools that remove plaques. The catheter will be removed when the blood flow improves. The stent will stay where it was placed, and the lining of the artery will grow over it. A bandage (dressing) will be placed on the insertion site. Pressure will be applied to stop bleeding. The IV will be removed. This procedure may vary among health care providers and hospitals. What happens after the procedure? Your blood pressure, heart rate, breathing rate, and blood oxygen level will be monitored until you leave the hospital or clinic. If the procedure is done through the leg, you will lie flat in bed for a few hours or for as long as told by your health care provider. You will be instructed not to bend or cross your legs. The insertion site and the pulse in your foot or wrist will be checked often. You may have more blood tests, X-rays, and a test that records the electrical activity of your heart (electrocardiogram, or ECG). Do not drive for 24 hours if you were given a sedative during your procedure. Summary Coronary angiogram with stent placement is a procedure to widen or open a narrowed coronary artery. This is done to treat heart problems. Before the procedure, let your health care provider know about all the medical conditions and surgeries you have or have had. This is a safe procedure. However, some problems may occur, including damage to nearby structures or organs, bleeding, blood clots, or allergies. Follow your health care provider's instructions about eating, drinking, medicines, and other lifestyle changes, such as quitting tobacco use before the procedure. This information is not intended to replace advice given to you by your health care provider. Make sure you discuss any questions you have with your health care  provider. Document Revised: 11/24/2018 Document Reviewed: 11/24/2018 Elsevier Patient Education  [place].  Chest X-Ray A chest X-ray is a test that uses radiation to create pictures of the organs in your chest, including the lungs, the heart, and the ribs. Chest X-rays are used to look for many health conditions, including heart failure, pneumonia, tuberculosis, rib fractures, breathing disorders, and cancer. They may be used to diagnose chest pain, constant coughing, or trouble breathing. Tell a health care provider about: Any allergies you have. All medicines you are taking, including vitamins, herbs, eye drops, creams, and over-the-counter medicines. Any surgeries you have had. Any medical conditions you have. Whether you are pregnant or may be pregnant. What are the risks? Getting a chest X-ray is a safe procedure. However, you will be exposed to a small amount of radiation. Being exposed to too much radiation over a lifetime can increase the risk of cancer. This risk is small, but it may occur if you have many X-rays throughout your life. What happens before the procedure? You may be asked to remove glasses, jewelry, and any other metal objects. You will be asked to undress from the waist up. You may be given a hospital gown to wear. You may be asked to wear a protective lead apron to protect other parts of your body from radiation. What happens during the procedure?  You will be asked to stand still as each picture is taken. This ensures that good pictures are taken. You will be asked to take a deep breath and hold it for a few seconds. The X-ray machine will create a picture of your chest using a tiny burst of radiation. This is painless. More pictures may be taken from other angles. Typically, one picture will be taken while you face the X-ray camera, and another picture will be taken from the side while you stand. If you cannot stand, you may be asked to lie down. The  procedure may vary among health care providers and hospitals. What can I expect after procedure? The X-ray will be reviewed by your health care provider or an X-ray specialist (radiologist). You may return to your normal, everyday life, including diet, activities, and medicines, unless your health care provider tells you not to do that. It is up to you to get your test results. Ask your health care provider, or the department that is doing the procedure, when your results will be ready. Your health care provider will tell you if you need more tests or a follow-up exam. Keep all follow-up visits. This is important. Summary A chest X-ray is a safe, painless test that uses radiation to create pictures of the organs inside your chest, including the lungs, heart, and ribs. You will need to undress from the waist up and remove jewelry and metal objects before the procedure. You will be exposed to a small amount of radiation during the procedure. The X-ray machine will take one or more pictures of your chest while you remain as still as possible. Later, a health care provider or specialist will review the test results with you. This information is not intended to replace advice given to you by your health care provider. Make sure you discuss any questions you have with your health care provider. Document Revised: 01/16/2021 Document Reviewed: 08/05/2019 Elsevier Patient Education  [place].

## 2021-04-19 NOTE — Addendum Note (Signed)
Addended by: Senaida Ores on: 04/19/2021 02:42 PM   Modules accepted: Orders

## 2021-04-19 NOTE — H&P (View-Only) (Signed)
Cardiology Office Note:    Date:  04/19/2021   ID:  Kristin [name], DOB [birth year], MRN [id number]  PCP:  London Pepper, MD  Cardiologist:  Jenne Campus, MD    Referring MD: London Pepper, MD   Chief Complaint  Patient presents with   Results  Pain shortness of breath and chest pain getting worst  History of Present Illness:    Kristin [name] is a 60 y.o. 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  Recently she had MRI done MRI could not clearly visualize ASD however right ventricle and right atrium seems to be normal in size, right ventricle function is also normal.  She does have what appears to be aneurysmal intra-atrial septum. She comes today to my office for follow-up symptoms that she described to me today are very worrisome.  Recently she went on the beach anytime she walks on the beach she was getting chest tightness and heaviness if she would slow down or stop and that sensation would go away.  She goes there every year around this time of the year and she never had a problem like this before this is clearly a new finding which is very concerning. We did discuss option for the situation I think we reached the point of cardiac catheterization will be needed.  That procedure will answer question about coronary artery disease as well as will allow this process to measure shunt and see the degree of shunt.  She agreed.  Past Medical History:  Diagnosis Date   Allergy    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   Dyspnea    Endometriosis    Hyperlipidemia    Hypertension    IBS (irritable bowel syndrome)    Loss of hair    Pain, abdominal    Skin cancer, basal cell    Vitamin D deficiency     Past Surgical History:  Procedure Laterality Date   [place]   In [place]   CYSTOURETHROSCOPY  2016   HYSTEROSCOPY  2015   LAPAROSCOPY  03/12/2016   with resection of pelvic adhesions   LAPAROTOMY  1990   post operative infection from cholecystectomy   OOPHORECTOMY Left 2000    Current Medications: Current Meds  Medication Sig   B Complex-C-Folic Acid TABS Take 1 tablet by mouth daily. Unknown strehgt   Biotin 5 MG CAPS Take 1 capsule by mouth daily.   Boswellia-Glucosamine-Vit D (OSTEO BI-FLEX ONE PER DAY PO) Take 1 tablet by mouth daily. Unknown strength   calcium carbonate (OS-CAL) 600 MG TABS tablet Take 600 mg by mouth daily.   cetirizine (ZYRTEC) 10 MG tablet Take 10 mg by mouth daily.   Cholecalciferol (VITAMIN D3) 50 MCG (2000 UT) TABS Take 1 tablet by mouth daily.   clobetasol (TEMOVATE) 0.05 % external solution Apply 1 application topically 2 (two) times daily.   Coenzyme Q10 (COQ-10) 100 MG CAPS Take 1 capsule by mouth daily.   estradiol (ESTRACE) 0.5 MG tablet Take 1 tablet (0.5 mg total) by mouth daily.   Evening Primrose Oil 1000 MG CAPS Take 1 capsule by mouth daily.   finasteride (PROSCAR) 5 MG tablet Take 5 mg by mouth daily.   fluticasone (FLONASE) 50 MCG/ACT nasal spray Place 1 spray into both nostrils as needed for allergies.  hyoscyamine (LEVBID) 0.375 MG 12 hr tablet Take 0.375 mg by mouth 2 (two) times daily.   ibuprofen (ADVIL,MOTRIN) 200 MG tablet Take 200-600 mg by mouth 2 (two) times daily as needed for mild pain or moderate pain (pain).   Multiple Vitamin (MULTIVITAMIN WITH MINERALS) TABS tablet Take 1 tablet by mouth at bedtime. Unknown strength   Multiple Vitamins-Minerals (MULTI FOR HER 50+) TABS Take 1 tablet by mouth daily. Unknown strenght   progesterone (PROMETRIUM) 200 MG capsule Take 1 capsule (200 mg total) by mouth daily.   rosuvastatin (CRESTOR) 5 MG tablet Take 1 tablet (5 mg total) by mouth daily.   vitamin E 400 UNIT capsule Take 400 Units by mouth daily with lunch.   Current  Facility-Administered Medications for the 04/19/21 encounter (Office Visit) with Park Liter, MD  Medication   0.9 %  sodium chloride infusion     Allergies:   Nickel, Other, Oxybutynin chloride er, Trospium, Diclofenac sodium, and Sporanox [itraconazole]   Social History   Socioeconomic History   Marital status: Married    Spouse name: Not on file   Number of children: 1   Years of education: Not on file   Highest education level: Not on file  Occupational History   Not on file  Tobacco Use   Smoking status: Former    Packs/day: 1.00    Years: 10.00    Pack years: 10.00    Types: Cigarettes    Quit date: 05/19/2004    Years since quitting: 16.9   Smokeless tobacco: Never  Vaping Use   Vaping Use: Never used  Substance and Sexual Activity   Alcohol use: No   Drug use: No   Sexual activity: Not Currently    Birth control/protection: Post-menopausal  Other Topics Concern   Not on file  Social History Narrative   Not on file   Social Determinants of Health   Financial Resource Strain: Not on file  Food Insecurity: Not on file  Transportation Needs: Not on file  Physical Activity: Not on file  Stress: Not on file  Social Connections: Not on file     Family History: The patient's family history includes Diabetes in her mother; Heart disease in her father; Kidney cancer in her mother; Liver disease in her father. ROS:   Please see the history of present illness.    All 14 point review of systems negative except as described per history of present illness  EKGs/Labs/Other Studies Reviewed:    Heart MRI showed: IMPRESSION: 1. Bicuspid Sievers type 0 AV with peak gradient only 9 mmHg no AR   2. Dilated aortic root 3.9 cm normal arch vessels with no coarctation   3. Normal LV size and function EF 53% no scar/infiltrate on late gadolinium images   4.  Normal RV size and function RVEF 55%   5. Acquired images did not show ASD. Aneurysmal dilatation of  the mid and basal septum. I reviewed her cardiac CT and echos done between October 2020-22   She clearly has an aneurysmal septum and either a large PFO or more anterior superior defect septum. The lack of RV/RA enlargement and normal RVEF and difficult identification on TTE does not support the large Qp/Qs obtained of 1.6. Since patient has dyspnea suggest further evaluation with TEE/Bubble study and right / left heart cath with shunt run to definitively define this concern.   6.  Normal PV anatomy with no anomaly   Jenkins Rouge  Recent Labs: 11/12/2020: ALT  19 03/20/2021: BUN 25; Creatinine, Ser 0.75; Hemoglobin 13.7; Platelets 266; Potassium 4.6; Sodium 144  Recent Lipid Panel    Component Value Date/Time   CHOL 177 05/16/2019 0754   TRIG 133 05/16/2019 0754   HDL 77 05/16/2019 0754   CHOLHDL 2.3 05/16/2019 0754   LDLCALC 77 05/16/2019 0754    Physical Exam:    VS:  BP 138/74 (BP Location: Right Arm, Patient Position: Sitting)   Pulse 77   Ht 5\' 2"  (1.575 m)   Wt 172 lb (78 kg)   LMP 02/23/2012   SpO2 97%   BMI 31.46 kg/m     Wt Readings from Last 3 Encounters:  04/19/21 172 lb (78 kg)  03/20/21 170 lb (77.1 kg)  03/14/21 170 lb (77.1 kg)     GEN:  Well nourished, well developed in no acute distress HEENT: Normal NECK: No JVD; No carotid bruits LYMPHATICS: No lymphadenopathy CARDIAC: RRR, no murmurs, no rubs, no gallops RESPIRATORY:  Clear to auscultation without rales, wheezing or rhonchi  ABDOMEN: Soft, non-tender, non-distended MUSCULOSKELETAL:  No edema; No deformity  SKIN: Warm and dry LOWER EXTREMITIES: no swelling NEUROLOGIC:  Alert and oriented x 3 PSYCHIATRIC:  Normal affect   ASSESSMENT:    1. Bicuspid aortic valve   2. ASD (atrial septal defect)   3. Primary hypertension   4. Atypical chest pain   5. Dyspnea on exertion    PLAN:    In order of problems listed above:  Bicuspid arctic valve without significant stenosis or regurgitation.   Continue monitoring. ASD with QP to QS at 1.6.  However she does have some worrisome symptoms that are disproportional to the degree of shunt.  On top of that right ventricle right atrium normal size and function.  Symptoms are worrisome for coronary disease.  I will ask her to start taking baby aspirin every single day, will put her on metoprolol, she will be scheduled for left and the right cardiac catheterization.  Procedure explained including all risk benefits as well as alternatives.  She agreed to proceed. Essential hypertension: Blood pressure well controlled today we will continue present management. Dyslipidemia I did review K PN which show me her LDL of 64 HDL 62, she is on Crestor 5 which I will continue.   Medication Adjustments/Labs and Tests Ordered: Current medicines are reviewed at length with the patient today.  Concerns regarding medicines are outlined above.  No orders of the defined types were placed in this encounter.  Medication changes: No orders of the defined types were placed in this encounter.   Signed, Park Liter, MD, [facility] 04/19/2021 2:05 PM    [place]

## 2021-04-19 NOTE — Progress Notes (Signed)
Cardiology Office Note:    Date:  04/19/2021   ID:  Kristin [name], DOB 12-28-1960, MRN [id number]  PCP:  London Pepper, MD  Cardiologist:  Jenne Campus, MD    Referring MD: London Pepper, MD   Chief Complaint  Patient presents with   Results  Pain shortness of breath and chest pain getting worst  History of Present Illness:    Kristin [name] is a 60 y.o. 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  Recently she had MRI done MRI could not clearly visualize ASD however right ventricle and right atrium seems to be normal in size, right ventricle function is also normal.  She does have what appears to be aneurysmal intra-atrial septum. She comes today to my office for follow-up symptoms that she described to me today are very worrisome.  Recently she went on the beach anytime she walks on the beach she was getting chest tightness and heaviness if she would slow down or stop and that sensation would go away.  She goes there every year around this time of the year and she never had a problem like this before this is clearly a new finding which is very concerning. We did discuss option for the situation I think we reached the point of cardiac catheterization will be needed.  That procedure will answer question about coronary artery disease as well as will allow this process to measure shunt and see the degree of shunt.  She agreed.  Past Medical History:  Diagnosis Date   Allergy    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   Dyspnea    Endometriosis    Hyperlipidemia    Hypertension    IBS (irritable bowel syndrome)    Loss of hair    Pain, abdominal    Skin cancer, basal cell    Vitamin D deficiency     Past Surgical History:  Procedure Laterality Date   [place]   In [place]   CYSTOURETHROSCOPY  2016   HYSTEROSCOPY  2015   LAPAROSCOPY  03/12/2016   with resection of pelvic adhesions   LAPAROTOMY  1990   post operative infection from cholecystectomy   OOPHORECTOMY Left 2000    Current Medications: Current Meds  Medication Sig   B Complex-C-Folic Acid TABS Take 1 tablet by mouth daily. Unknown strehgt   Biotin 5 MG CAPS Take 1 capsule by mouth daily.   Boswellia-Glucosamine-Vit D (OSTEO BI-FLEX ONE PER DAY PO) Take 1 tablet by mouth daily. Unknown strength   calcium carbonate (OS-CAL) 600 MG TABS tablet Take 600 mg by mouth daily.   cetirizine (ZYRTEC) 10 MG tablet Take 10 mg by mouth daily.   Cholecalciferol (VITAMIN D3) 50 MCG (2000 UT) TABS Take 1 tablet by mouth daily.   clobetasol (TEMOVATE) 0.05 % external solution Apply 1 application topically 2 (two) times daily.   Coenzyme Q10 (COQ-10) 100 MG CAPS Take 1 capsule by mouth daily.   estradiol (ESTRACE) 0.5 MG tablet Take 1 tablet (0.5 mg total) by mouth daily.   Evening Primrose Oil 1000 MG CAPS Take 1 capsule by mouth daily.   finasteride (PROSCAR) 5 MG tablet Take 5 mg by mouth daily.   fluticasone (FLONASE) 50 MCG/ACT nasal spray Place 1 spray into both nostrils as needed for allergies.  hyoscyamine (LEVBID) 0.375 MG 12 hr tablet Take 0.375 mg by mouth 2 (two) times daily.   ibuprofen (ADVIL,MOTRIN) 200 MG tablet Take 200-600 mg by mouth 2 (two) times daily as needed for mild pain or moderate pain (pain).   Multiple Vitamin (MULTIVITAMIN WITH MINERALS) TABS tablet Take 1 tablet by mouth at bedtime. Unknown strength   Multiple Vitamins-Minerals (MULTI FOR HER 50+) TABS Take 1 tablet by mouth daily. Unknown strenght   progesterone (PROMETRIUM) 200 MG capsule Take 1 capsule (200 mg total) by mouth daily.   rosuvastatin (CRESTOR) 5 MG tablet Take 1 tablet (5 mg total) by mouth daily.   vitamin E 400 UNIT capsule Take 400 Units by mouth daily with lunch.   Current  Facility-Administered Medications for the 04/19/21 encounter (Office Visit) with Park Liter, MD  Medication   0.9 %  sodium chloride infusion     Allergies:   Nickel, Other, Oxybutynin chloride er, Trospium, Diclofenac sodium, and Sporanox [itraconazole]   Social History   Socioeconomic History   Marital status: Married    Spouse name: Not on file   Number of children: 1   Years of education: Not on file   Highest education level: Not on file  Occupational History   Not on file  Tobacco Use   Smoking status: Former    Packs/day: 1.00    Years: 10.00    Pack years: 10.00    Types: Cigarettes    Quit date: 05/19/2004    Years since quitting: 16.9   Smokeless tobacco: Never  Vaping Use   Vaping Use: Never used  Substance and Sexual Activity   Alcohol use: No   Drug use: No   Sexual activity: Not Currently    Birth control/protection: Post-menopausal  Other Topics Concern   Not on file  Social History Narrative   Not on file   Social Determinants of Health   Financial Resource Strain: Not on file  Food Insecurity: Not on file  Transportation Needs: Not on file  Physical Activity: Not on file  Stress: Not on file  Social Connections: Not on file     Family History: The patient's family history includes Diabetes in her mother; Heart disease in her father; Kidney cancer in her mother; Liver disease in her father. ROS:   Please see the history of present illness.    All 14 point review of systems negative except as described per history of present illness  EKGs/Labs/Other Studies Reviewed:    Heart MRI showed: IMPRESSION: 1. Bicuspid Sievers type 0 AV with peak gradient only 9 mmHg no AR   2. Dilated aortic root 3.9 cm normal arch vessels with no coarctation   3. Normal LV size and function EF 53% no scar/infiltrate on late gadolinium images   4.  Normal RV size and function RVEF 55%   5. Acquired images did not show ASD. Aneurysmal dilatation of  the mid and basal septum. I reviewed her cardiac CT and echos done between October 2020-22   She clearly has an aneurysmal septum and either a large PFO or more anterior superior defect septum. The lack of RV/RA enlargement and normal RVEF and difficult identification on TTE does not support the large Qp/Qs obtained of 1.6. Since patient has dyspnea suggest further evaluation with TEE/Bubble study and right / left heart cath with shunt run to definitively define this concern.   6.  Normal PV anatomy with no anomaly   Jenkins Rouge  Recent Labs: 11/12/2020: ALT  19 03/20/2021: BUN 25; Creatinine, Ser 0.75; Hemoglobin 13.7; Platelets 266; Potassium 4.6; Sodium 144  Recent Lipid Panel    Component Value Date/Time   CHOL 177 05/16/2019 0754   TRIG 133 05/16/2019 0754   HDL 77 05/16/2019 0754   CHOLHDL 2.3 05/16/2019 0754   LDLCALC 77 05/16/2019 0754    Physical Exam:    VS:  BP 138/74 (BP Location: Right Arm, Patient Position: Sitting)   Pulse 77   Ht 5\' 2"  (1.575 m)   Wt 172 lb (78 kg)   LMP 02/23/2012   SpO2 97%   BMI 31.46 kg/m     Wt Readings from Last 3 Encounters:  04/19/21 172 lb (78 kg)  03/20/21 170 lb (77.1 kg)  03/14/21 170 lb (77.1 kg)     GEN:  Well nourished, well developed in no acute distress HEENT: Normal NECK: No JVD; No carotid bruits LYMPHATICS: No lymphadenopathy CARDIAC: RRR, no murmurs, no rubs, no gallops RESPIRATORY:  Clear to auscultation without rales, wheezing or rhonchi  ABDOMEN: Soft, non-tender, non-distended MUSCULOSKELETAL:  No edema; No deformity  SKIN: Warm and dry LOWER EXTREMITIES: no swelling NEUROLOGIC:  Alert and oriented x 3 PSYCHIATRIC:  Normal affect   ASSESSMENT:    1. Bicuspid aortic valve   2. ASD (atrial septal defect)   3. Primary hypertension   4. Atypical chest pain   5. Dyspnea on exertion    PLAN:    In order of problems listed above:  Bicuspid arctic valve without significant stenosis or regurgitation.   Continue monitoring. ASD with QP to QS at 1.6.  However she does have some worrisome symptoms that are disproportional to the degree of shunt.  On top of that right ventricle right atrium normal size and function.  Symptoms are worrisome for coronary disease.  I will ask her to start taking baby aspirin every single day, will put her on metoprolol, she will be scheduled for left and the right cardiac catheterization.  Procedure explained including all risk benefits as well as alternatives.  She agreed to proceed. Essential hypertension: Blood pressure well controlled today we will continue present management. Dyslipidemia I did review K PN which show me her LDL of 64 HDL 62, she is on Crestor 5 which I will continue.   Medication Adjustments/Labs and Tests Ordered: Current medicines are reviewed at length with the patient today.  Concerns regarding medicines are outlined above.  No orders of the defined types were placed in this encounter.  Medication changes: No orders of the defined types were placed in this encounter.   Signed, Park Liter, MD, [facility] 04/19/2021 2:05 PM    [place]

## 2021-04-20 LAB — CBC
Hematocrit: 39.1 % (ref 34.0–46.6)
Hemoglobin: 13.3 g/dL (ref 11.1–15.9)
MCH: 30.6 pg (ref 26.6–33.0)
MCHC: 34 g/dL (ref 31.5–35.7)
MCV: 90 fL (ref 79–97)
Platelets: 272 10*3/uL (ref 150–450)
RBC: 4.35 x10E6/uL (ref 3.77–5.28)
RDW: 12.3 % (ref 11.7–15.4)
WBC: 6.5 10*3/uL (ref 3.4–10.8)

## 2021-04-20 LAB — BASIC METABOLIC PANEL
BUN/Creatinine Ratio: 37 — ABNORMAL HIGH (ref 12–28)
BUN: 23 mg/dL (ref 8–27)
CO2: 23 mmol/L (ref 20–29)
Calcium: 9.6 mg/dL (ref 8.7–10.3)
Chloride: 106 mmol/L (ref 96–106)
Creatinine, Ser: 0.62 mg/dL (ref 0.57–1.00)
Glucose: 94 mg/dL (ref 70–99)
Potassium: 4.6 mmol/L (ref 3.5–5.2)
Sodium: 142 mmol/L (ref 134–144)
eGFR: 102 mL/min/{1.73_m2} (ref >59)

## 2021-04-22 ENCOUNTER — Telehealth: Payer: Self-pay | Admitting: *Deleted

## 2021-04-22 NOTE — H&P (Signed)
ASD with I.6 :1 left to right shunt Chest pain and dyspnea on exertion Left and right heart cath to r/o CAD and worsening L--> R shunt.

## 2021-04-22 NOTE — Telephone Encounter (Signed)
Cardiac catheterization scheduled at [facility] for: Tuesday April 23, 2021 10:30 AM [facility] Main Entrance [facility]) at: 8:30 AM   Diet-no solid food after midnight prior to cath, clear liquids until 5 AM day of procedure.   Medication instructions for procedure: -Usual morning medications can be taken pre-cath with sips of water including aspirin 81 mg.    Confirmed patient has responsible adult to drive home post procedure and be with patient first 24 hours after arriving home.  [facility] does allow one visitor to accompany you and wait in the hospital waiting room while you are there for your procedure. You and your visitor will be asked to wear a mask once you enter the hospital.   Patient reports does not currently have any new symptoms concerning for COVID-19 and no household members with COVID-19 like illness.    Reviewed procedure/mask/visitor instructions with patient.

## 2021-04-23 ENCOUNTER — Other Ambulatory Visit: Payer: Self-pay

## 2021-04-23 ENCOUNTER — Ambulatory Visit (HOSPITAL_COMMUNITY)
Admission: RE | Admit: 2021-04-23 | Discharge: 2021-04-23 | Disposition: A | Discharge status: Home / Self Care | Payer: 59 | Attending: Interventional Cardiology | Admitting: Interventional Cardiology

## 2021-04-23 ENCOUNTER — Encounter (HOSPITAL_COMMUNITY)
Admission: RE | Disposition: A | Discharge status: Home / Self Care | Payer: Self-pay | Source: Home / Self Care | Attending: Interventional Cardiology

## 2021-04-23 DIAGNOSIS — E7800 Pure hypercholesterolemia, unspecified: Secondary | ICD-10-CM | POA: Diagnosis present

## 2021-04-23 DIAGNOSIS — E785 Hyperlipidemia, unspecified: Secondary | ICD-10-CM | POA: Diagnosis not present

## 2021-04-23 DIAGNOSIS — R0789 Other chest pain: Secondary | ICD-10-CM | POA: Diagnosis present

## 2021-04-23 DIAGNOSIS — Z79899 Other long term (current) drug therapy: Secondary | ICD-10-CM | POA: Insufficient documentation

## 2021-04-23 DIAGNOSIS — Q211 Atrial septal defect, unspecified: Secondary | ICD-10-CM | POA: Diagnosis not present

## 2021-04-23 DIAGNOSIS — R0609 Other forms of dyspnea: Secondary | ICD-10-CM | POA: Diagnosis present

## 2021-04-23 DIAGNOSIS — Q231 Congenital insufficiency of aortic valve: Secondary | ICD-10-CM | POA: Diagnosis not present

## 2021-04-23 DIAGNOSIS — I10 Essential (primary) hypertension: Secondary | ICD-10-CM | POA: Diagnosis not present

## 2021-04-23 HISTORY — PX: RIGHT/LEFT HEART CATH AND CORONARY ANGIOGRAPHY: CATH118266

## 2021-04-23 LAB — POCT I-STAT 7, (LYTES, BLD GAS, ICA,H+H)
Acid-base deficit: 2 mmol/L (ref 0.0–2.0)
Acid-base deficit: 2 mmol/L (ref 0.0–2.0)
Acid-base deficit: 2 mmol/L (ref 0.0–2.0)
Acid-base deficit: 2 mmol/L (ref 0.0–2.0)
Acid-base deficit: 2 mmol/L (ref 0.0–2.0)
Bicarbonate: 24.3 mmol/L (ref 20.0–28.0)
Bicarbonate: 24.5 mmol/L (ref 20.0–28.0)
Bicarbonate: 23.9 mmol/L (ref 20.0–28.0)
Bicarbonate: 24 mmol/L (ref 20.0–28.0)
Bicarbonate: 24.3 mmol/L (ref 20.0–28.0)
Calcium, Ion: 1.2 mmol/L (ref 1.15–1.40)
Calcium, Ion: 1.22 mmol/L (ref 1.15–1.40)
Calcium, Ion: 1.25 mmol/L (ref 1.15–1.40)
Calcium, Ion: 1.26 mmol/L (ref 1.15–1.40)
Calcium, Ion: 1.27 mmol/L (ref 1.15–1.40)
HCT: 36 % (ref 36.0–46.0)
HCT: 37 % (ref 36.0–46.0)
HCT: 36 % (ref 36.0–46.0)
HCT: 36 % (ref 36.0–46.0)
HCT: 37 % (ref 36.0–46.0)
Hemoglobin: 12.2 g/dL (ref 12.0–15.0)
Hemoglobin: 12.6 g/dL (ref 12.0–15.0)
Hemoglobin: 12.2 g/dL (ref 12.0–15.0)
Hemoglobin: 12.2 g/dL (ref 12.0–15.0)
Hemoglobin: 12.6 g/dL (ref 12.0–15.0)
O2 Saturation: 83 %
O2 Saturation: 85 %
O2 Saturation: 81 %
O2 Saturation: 83 %
O2 Saturation: 99 %
Potassium: 3.6 mmol/L (ref 3.5–5.1)
Potassium: 3.7 mmol/L (ref 3.5–5.1)
Potassium: 3.7 mmol/L (ref 3.5–5.1)
Potassium: 3.7 mmol/L (ref 3.5–5.1)
Potassium: 3.7 mmol/L (ref 3.5–5.1)
Sodium: 142 mmol/L (ref 135–145)
Sodium: 144 mmol/L (ref 135–145)
Sodium: 142 mmol/L (ref 135–145)
Sodium: 142 mmol/L (ref 135–145)
Sodium: 142 mmol/L (ref 135–145)
TCO2: 26 mmol/L (ref 22–32)
TCO2: 26 mmol/L (ref 22–32)
TCO2: 25 mmol/L (ref 22–32)
TCO2: 25 mmol/L (ref 22–32)
TCO2: 26 mmol/L (ref 22–32)
pCO2 arterial: 47.5 mmHg (ref 32.0–48.0)
pCO2 arterial: 49 mmHg — ABNORMAL HIGH (ref 32.0–48.0)
pCO2 arterial: 43.7 mmHg (ref 32.0–48.0)
pCO2 arterial: 46.8 mmHg (ref 32.0–48.0)
pCO2 arterial: 48.3 mmHg — ABNORMAL HIGH (ref 32.0–48.0)
pH, Arterial: 7.304 — ABNORMAL LOW (ref 7.350–7.450)
pH, Arterial: 7.32 — ABNORMAL LOW (ref 7.350–7.450)
pH, Arterial: 7.309 — ABNORMAL LOW (ref 7.350–7.450)
pH, Arterial: 7.319 — ABNORMAL LOW (ref 7.350–7.450)
pH, Arterial: 7.346 — ABNORMAL LOW (ref 7.350–7.450)
pO2, Arterial: 52 mmHg — ABNORMAL LOW (ref 83.0–108.0)
pO2, Arterial: 55 mmHg — ABNORMAL LOW (ref 83.0–108.0)
pO2, Arterial: 151 mmHg — ABNORMAL HIGH (ref 83.0–108.0)
pO2, Arterial: 50 mmHg — ABNORMAL LOW (ref 83.0–108.0)
pO2, Arterial: 52 mmHg — ABNORMAL LOW (ref 83.0–108.0)

## 2021-04-23 LAB — POCT I-STAT EG7
Acid-base deficit: 2 mmol/L (ref 0.0–2.0)
Bicarbonate: 24.9 mmol/L (ref 20.0–28.0)
Calcium, Ion: 1.27 mmol/L (ref 1.15–1.40)
HCT: 37 % (ref 36.0–46.0)
Hemoglobin: 12.6 g/dL (ref 12.0–15.0)
O2 Saturation: 82 %
Potassium: 3.7 mmol/L (ref 3.5–5.1)
Sodium: 142 mmol/L (ref 135–145)
TCO2: 26 mmol/L (ref 22–32)
pCO2, Ven: 49.8 mmHg (ref 44.0–60.0)
pH, Ven: 7.307 (ref 7.250–7.430)
pO2, Ven: 51 mmHg — ABNORMAL HIGH (ref 32.0–45.0)

## 2021-04-23 SURGERY — RIGHT/LEFT HEART CATH AND CORONARY ANGIOGRAPHY
Anesthesia: LOCAL

## 2021-04-23 MED ORDER — FENTANYL CITRATE (PF) 100 MCG/2ML IJ SOLN
INTRAMUSCULAR | Status: AC
  Filled 2021-04-23: qty 2

## 2021-04-23 MED ORDER — LIDOCAINE HCL (PF) 1 % IJ SOLN
INTRAMUSCULAR | Status: DC | PRN
  Administered 2021-04-23 (×2): 5 mL

## 2021-04-23 MED ORDER — MIDAZOLAM HCL 2 MG/2ML IJ SOLN
INTRAMUSCULAR | Status: AC
  Filled 2021-04-23: qty 2

## 2021-04-23 MED ORDER — FENTANYL CITRATE (PF) 100 MCG/2ML IJ SOLN
INTRAMUSCULAR | Status: DC | PRN
  Administered 2021-04-23 (×2): 25 ug via INTRAVENOUS

## 2021-04-23 MED ORDER — SODIUM CHLORIDE 0.9 % WEIGHT BASED INFUSION: 1.0000 mL/kg/h | INTRAVENOUS | Status: DC

## 2021-04-23 MED ORDER — IOHEXOL 350 MG/ML SOLN
INTRAVENOUS | Status: DC | PRN
  Administered 2021-04-23: 70 mL via INTRA_ARTERIAL

## 2021-04-23 MED ORDER — SODIUM CHLORIDE 0.9% FLUSH: 3.0000 mL | INTRAVENOUS | Status: DC | PRN

## 2021-04-23 MED ORDER — SODIUM CHLORIDE 0.9% FLUSH: 3.0000 mL | Freq: Two times a day (BID) | INTRAVENOUS | Status: DC

## 2021-04-23 MED ORDER — ASPIRIN 81 MG PO CHEW: 81.0000 mg | CHEWABLE_TABLET | ORAL | Status: DC

## 2021-04-23 MED ORDER — SODIUM CHLORIDE 0.9 % WEIGHT BASED INFUSION
3.0000 mL/kg/h | INTRAVENOUS | Status: AC
  Administered 2021-04-23: 3 mL/kg/h via INTRAVENOUS

## 2021-04-23 MED ORDER — HEPARIN SODIUM (PORCINE) 1000 UNIT/ML IJ SOLN
INTRAMUSCULAR | Status: AC
  Filled 2021-04-23: qty 10

## 2021-04-23 MED ORDER — VERAPAMIL HCL 2.5 MG/ML IV SOLN
INTRAVENOUS | Status: DC | PRN
  Administered 2021-04-23: 10 mL via INTRA_ARTERIAL

## 2021-04-23 MED ORDER — LIDOCAINE HCL (PF) 1 % IJ SOLN
INTRAMUSCULAR | Status: AC
  Filled 2021-04-23: qty 30

## 2021-04-23 MED ORDER — SODIUM CHLORIDE 0.9 % IV SOLN: 250.0000 mL | INTRAVENOUS | Status: DC | PRN

## 2021-04-23 MED ORDER — MIDAZOLAM HCL 2 MG/2ML IJ SOLN
INTRAMUSCULAR | Status: DC | PRN
  Administered 2021-04-23 (×2): 1 mg via INTRAVENOUS

## 2021-04-23 MED ORDER — VERAPAMIL HCL 2.5 MG/ML IV SOLN
INTRAVENOUS | Status: AC
  Filled 2021-04-23: qty 2

## 2021-04-23 MED ORDER — HEPARIN SODIUM (PORCINE) 1000 UNIT/ML IJ SOLN
INTRAMUSCULAR | Status: DC | PRN
  Administered 2021-04-23: 4000 [IU] via INTRAVENOUS

## 2021-04-23 MED ORDER — HEPARIN (PORCINE) IN NACL 1000-0.9 UT/500ML-% IV SOLN
INTRAVENOUS | Status: AC
  Filled 2021-04-23: qty 1000

## 2021-04-23 MED ORDER — HEPARIN (PORCINE) IN NACL 1000-0.9 UT/500ML-% IV SOLN
INTRAVENOUS | Status: DC | PRN
  Administered 2021-04-23 (×2): 500 mL

## 2021-04-23 SURGICAL SUPPLY — 12 items
CATH 5FR JL3.5 JR4 ANG PIG MP (CATHETERS) ×2 IMPLANT
CATH BALLN WEDGE 5F 110CM (CATHETERS) ×2 IMPLANT
DEVICE RAD COMP TR BAND LRG (VASCULAR PRODUCTS) ×2 IMPLANT
GLIDESHEATH SLEND A-KIT 6F 22G (SHEATH) ×2 IMPLANT
GUIDEWIRE INQWIRE 1.5J.035X260 (WIRE) ×1 IMPLANT
INQWIRE 1.5J .035X260CM (WIRE) ×2
KIT HEART LEFT (KITS) ×2 IMPLANT
PACK CARDIAC CATHETERIZATION (CUSTOM PROCEDURE TRAY) ×2 IMPLANT
SHEATH GLIDE SLENDER 4/5FR (SHEATH) ×2 IMPLANT
SHEATH PROBE COVER 6X72 (BAG) ×2 IMPLANT
TRANSDUCER W/STOPCOCK (MISCELLANEOUS) ×2 IMPLANT
TUBING CIL FLEX 10 FLL-RA (TUBING) ×2 IMPLANT

## 2021-04-23 NOTE — Interval H&P Note (Signed)
Cath Lab Visit (complete for each Cath Lab visit)  Clinical Evaluation Leading to the Procedure:   ACS: No.  Non-ACS:    Anginal Classification: CCS III  Anti-ischemic medical therapy: Minimal Therapy (1 class of medications)  Non-Invasive Test Results: Intermediate-risk stress test findings: cardiac mortality 1-3%/year  Prior CABG: No previous CABG      History and Physical Interval Note:  04/23/2021 9:54 AM  Kristin [name]  has presented today for surgery, with the diagnosis of chest pain, asd.  The various methods of treatment have been discussed with the patient and family. After consideration of risks, benefits and other options for treatment, the patient has consented to  Procedure(s): RIGHT/LEFT HEART CATH AND CORONARY ANGIOGRAPHY (N/A) as a surgical intervention.  The patient's history has been reviewed, patient examined, no change in status, stable for surgery.  I have reviewed the patient's chart and labs.  Questions were answered to the patient's satisfaction.     Belva Crome III

## 2021-04-23 NOTE — Progress Notes (Addendum)
Patient had right forearm bleed, pressure applied. Will continue to monitor.

## 2021-04-23 NOTE — CV Procedure (Signed)
Normal pulmonary artery pressures and normal pulmonary capillary wedge O2 saturation:: Superior vena cava 82%; high right atrium 83%; low right atrium 85%; RV inflow 83%; PA 81%; AO 99%. Normal LV function Normal coronary arteries Qp/Qs = .89

## 2021-04-23 NOTE — Discharge Instructions (Signed)

## 2021-04-23 NOTE — Progress Notes (Signed)
Called to short stay for forearm hematoma.  Right forearm slightly tight, hematoma present, does not appear to be from the antecubital or radial stick sites. Blood pressure cuff applied to fore arm hematoma for 15 minutes. Spo2 of 95% maintained during cuff application.   Site firm , no additional swelling noted, hematoma reduced, not enough hematoma remaining to mark.  Tr band still in place on right radial.   Dr. Tamala Julian aware.

## 2021-04-23 NOTE — Progress Notes (Signed)
Patient was given discharge instructions, she verbalized understanding.

## 2021-04-24 ENCOUNTER — Encounter (HOSPITAL_COMMUNITY): Payer: Self-pay | Admitting: Interventional Cardiology

## 2021-04-29 ENCOUNTER — Telehealth: Payer: Self-pay | Admitting: Cardiology

## 2021-04-29 DIAGNOSIS — Q211 Atrial septal defect, unspecified: Secondary | ICD-10-CM

## 2021-04-29 DIAGNOSIS — E785 Hyperlipidemia, unspecified: Secondary | ICD-10-CM

## 2021-04-29 MED ORDER — ROSUVASTATIN CALCIUM 5 MG PO TABS: 5.0000 mg | ORAL_TABLET | Freq: Every day | ORAL | 3 refills | Status: DC

## 2021-04-29 NOTE — Telephone Encounter (Signed)
*  STAT* If patient is at the pharmacy, call can be transferred to refill team.   1. Which medications need to be refilled? (please list name of each medication and dose if known) rosuvastatin (CRESTOR) 5 MG tablet  2. Which pharmacy/location (including street and city if local pharmacy) is medication to be sent to?[facility] [id number] - [place], [place]  3. Do they need a 30 day or 90 day supply? 90 day

## 2021-04-29 NOTE — Telephone Encounter (Signed)
Refill of Rosuvastatin 5 mg sent to [facility] in [place].

## 2021-06-03 DIAGNOSIS — I77810 Thoracic aortic ectasia: Secondary | ICD-10-CM

## 2021-06-03 HISTORY — DX: Thoracic aortic ectasia: I77.810

## 2021-06-05 ENCOUNTER — Other Ambulatory Visit: Payer: Self-pay

## 2021-06-05 ENCOUNTER — Encounter: Payer: Self-pay | Admitting: Cardiology

## 2021-06-05 ENCOUNTER — Ambulatory Visit: Payer: 59 | Admitting: Cardiology

## 2021-06-05 VITALS — BP 134/82 | HR 85 | Ht 62.0 in | Wt 172.0 lb

## 2021-06-05 DIAGNOSIS — I77810 Thoracic aortic ectasia: Secondary | ICD-10-CM

## 2021-06-05 DIAGNOSIS — E7800 Pure hypercholesterolemia, unspecified: Secondary | ICD-10-CM | POA: Diagnosis not present

## 2021-06-05 DIAGNOSIS — Q231 Congenital insufficiency of aortic valve: Secondary | ICD-10-CM

## 2021-06-05 DIAGNOSIS — Q211 Atrial septal defect, unspecified: Secondary | ICD-10-CM | POA: Diagnosis not present

## 2021-06-05 NOTE — Patient Instructions (Signed)

## 2021-06-05 NOTE — Progress Notes (Signed)
Cardiology Office Note:    Date:  06/05/2021   ID:  Kristin [name], DOB May 04, 1961, MRN [id number]  PCP:  London Pepper, MD  Cardiologist:  Jenne Campus, MD    Referring MD: London Pepper, MD   Chief Complaint  Patient presents with   Cardiac cath follow up   Am doing better but still has some symptoms  History of Present Illness:    Kristin [name] is a 61 y.o. female   with past medical history significant for bicuspid aortic valve, no significant stenosis, no significant regurgitation, ascending aortic enlargement measuring 40 mm, ASD with shunt of 1.6, essential hypertension.  Recently she had MRI done MRI could not clearly visualize ASD however right ventricle and right atrium seems to be normal in size, right ventricle function is also normal.  She does have what appears to be aneurysmal intra-atrial septum. She eventually end up having cardiac catheterization which did not show any evidence of significant shunt, she still does have some epigastric discomfort now she tells me that this is mostly when she eats and is when she leans forward.  Suspicion is maybe for some GI issues apparently she did have a cholecystectomy done at a very early age.  I advised her to see a gastroenterologist.  Past Medical History:  Diagnosis Date   Allergy    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   Dyspnea    Endometriosis    Hyperlipidemia    Hypertension    IBS (irritable bowel syndrome)    Loss of hair    Pain, abdominal    Skin cancer, basal cell    Vitamin D deficiency     Past Surgical History:  Procedure Laterality Date   [place]   In [place]   CYSTOURETHROSCOPY  2016   HYSTEROSCOPY  2015   LAPAROSCOPY  03/12/2016   with resection of pelvic adhesions   LAPAROTOMY  1990   post operative infection from cholecystectomy   OOPHORECTOMY Left 2000   RIGHT/LEFT HEART CATH AND CORONARY ANGIOGRAPHY N/A 04/23/2021    Procedure: RIGHT/LEFT HEART CATH AND CORONARY ANGIOGRAPHY;  Surgeon: Belva Crome, MD;  Location: [facility];  Service: Cardiovascular;  Laterality: N/A;    Current Medications: Current Meds  Medication Sig   aspirin EC 81 MG tablet Take 1 tablet (81 mg total) by mouth daily. Swallow whole.   Biotin 5 MG CAPS Take 1 capsule by mouth daily in the afternoon.   Boswellia-Glucosamine-Vit D (OSTEO BI-FLEX ONE PER DAY PO) Take 1 tablet by mouth daily in the afternoon. Unknown strength   calcium carbonate (OS-CAL) 600 MG TABS tablet Take 600 mg by mouth daily in the afternoon.   cetirizine (ZYRTEC) 10 MG tablet Take 10 mg by mouth daily as needed for allergies.   Cholecalciferol (VITAMIN D3) 50 MCG (2000 UT) TABS Take 2,000 Units by mouth daily in the afternoon.   clobetasol (TEMOVATE) 0.05 % external solution Apply 1 application topically 2 (two) times daily as needed (skin irritation.).   Coenzyme Q10 (COQ-10) 100 MG CAPS Take 1 capsule by mouth daily in the afternoon.   estradiol (ESTRACE) 0.5 MG tablet Take 1 tablet (0.5 mg total) by mouth daily.   Evening Primrose Oil 1000 MG CAPS Take 1 capsule by mouth at bedtime.   finasteride (PROSCAR) 5 MG tablet Take 5 mg by mouth daily at 6 PM.   hyoscyamine (LEVBID) 0.375 MG 12 hr  tablet Take 0.375 mg by mouth at bedtime.   ibuprofen (ADVIL,MOTRIN) 200 MG tablet Take 200-600 mg by mouth 2 (two) times daily as needed for mild pain or moderate pain (pain).   metoprolol tartrate (LOPRESSOR) 25 MG tablet Take 1 tablet (25 mg total) by mouth 2 (two) times daily.   Multiple Vitamin (MULTIVITAMIN WITH MINERALS) TABS tablet Take 1 tablet by mouth daily in the afternoon. Unknown strength   progesterone (PROMETRIUM) 200 MG capsule Take 1 capsule (200 mg total) by mouth daily.   rosuvastatin (CRESTOR) 5 MG tablet Take 1 tablet (5 mg total) by mouth daily.   vitamin E 400 UNIT capsule Take 400 Units by mouth daily in the afternoon.   Current  Facility-Administered Medications for the 06/05/21 encounter (Office Visit) with Park Liter, MD  Medication   0.9 %  sodium chloride infusion     Allergies:   Nickel, Other, Oxybutynin chloride er, Diclofenac sodium, Sporanox [itraconazole], and Trospium   Social History   Socioeconomic History   Marital status: Married    Spouse name: Not on file   Number of children: 1   Years of education: Not on file   Highest education level: Not on file  Occupational History   Not on file  Tobacco Use   Smoking status: Former    Packs/day: 1.00    Years: 10.00    Pack years: 10.00    Types: Cigarettes    Quit date: 05/19/2004    Years since quitting: 17.0   Smokeless tobacco: Never  Vaping Use   Vaping Use: Never used  Substance and Sexual Activity   Alcohol use: No   Drug use: No   Sexual activity: Not Currently    Birth control/protection: Post-menopausal  Other Topics Concern   Not on file  Social History Narrative   Not on file   Social Determinants of Health   Financial Resource Strain: Not on file  Food Insecurity: Not on file  Transportation Needs: Not on file  Physical Activity: Not on file  Stress: Not on file  Social Connections: Not on file     Family History: The patient's family history includes Diabetes in her mother; Heart disease in her father; Kidney cancer in her mother; Liver disease in her father. ROS:   Please see the history of present illness.    All 14 point review of systems negative except as described per history of present illness  EKGs/Labs/Other Studies Reviewed:      Recent Labs: 11/12/2020: ALT 19 04/19/2021: BUN 23; Creatinine, Ser 0.62; Platelets 272 04/23/2021: Hemoglobin 12.2; Potassium 3.7; Sodium 142  Recent Lipid Panel    Component Value Date/Time   CHOL 177 05/16/2019 0754   TRIG 133 05/16/2019 0754   HDL 77 05/16/2019 0754   CHOLHDL 2.3 05/16/2019 0754   LDLCALC 77 05/16/2019 0754    Physical Exam:    VS:  BP  134/82 (BP Location: Right Arm, Patient Position: Sitting)    Pulse 85    Ht 5\' 2"  (1.575 m)    Wt 172 lb (78 kg)    LMP 02/23/2012    SpO2 95%    BMI 31.46 kg/m     Wt Readings from Last 3 Encounters:  06/05/21 172 lb (78 kg)  04/23/21 170 lb (77.1 kg)  04/19/21 172 lb (78 kg)     GEN:  Well nourished, well developed in no acute distress HEENT: Normal NECK: No JVD; No carotid bruits LYMPHATICS: No lymphadenopathy CARDIAC: RRR,  no murmurs, no rubs, no gallops RESPIRATORY:  Clear to auscultation without rales, wheezing or rhonchi  ABDOMEN: Soft, non-tender, non-distended MUSCULOSKELETAL:  No edema; No deformity  SKIN: Warm and dry LOWER EXTREMITIES: no swelling NEUROLOGIC:  Alert and oriented x 3 PSYCHIATRIC:  Normal affect   ASSESSMENT:    1. Bicuspid aortic valve   2. ASD (atrial septal defect)   3. Aortic root dilatation (HCC)   4. High cholesterol    PLAN:    In order of problems listed above:  Bicuspid arctic valve without significant stenosis no regurgitation.  Obviously she needs to have a frequent follow-up for that.  I see her back in 6 months in my office. Enlargement aortic root.  Still acceptable size no need to intervene. Questionable ASD.  Her MRI did not show any significant right-sided enlargement.  Which tells me if it is truly a is the hemodynamically insignificant, her cardiac catheterization did not show any significant shunt.  I think we can drop that issue I do not think there is anything that need to be fixed at this stage. Dyslipidemia I did review her K PN which show me her LDL of 64 HDL 62.  Continue present management. Essential hypertension blood pressure seems to be reasonably controlled continue present management.   Medication Adjustments/Labs and Tests Ordered: Current medicines are reviewed at length with the patient today.  Concerns regarding medicines are outlined above.  No orders of the defined types were placed in this  encounter.  Medication changes: No orders of the defined types were placed in this encounter.   Signed, Park Liter, MD, [facility] 06/05/2021 3:48 PM    [place]

## 2021-08-06 ENCOUNTER — Ambulatory Visit: Payer: 59 | Admitting: Cardiology

## 2021-09-18 IMAGING — MG DIGITAL SCREENING BILAT W/ TOMO W/ CAD
6 of 12 series · 6 of 36 positions shown · non-contrast
Comparison: Previous exam(s).

CLINICAL DATA: Screening.

EXAM:
DIGITAL SCREENING BILATERAL MAMMOGRAM WITH TOMO AND CAD

[L CC synth-2D]
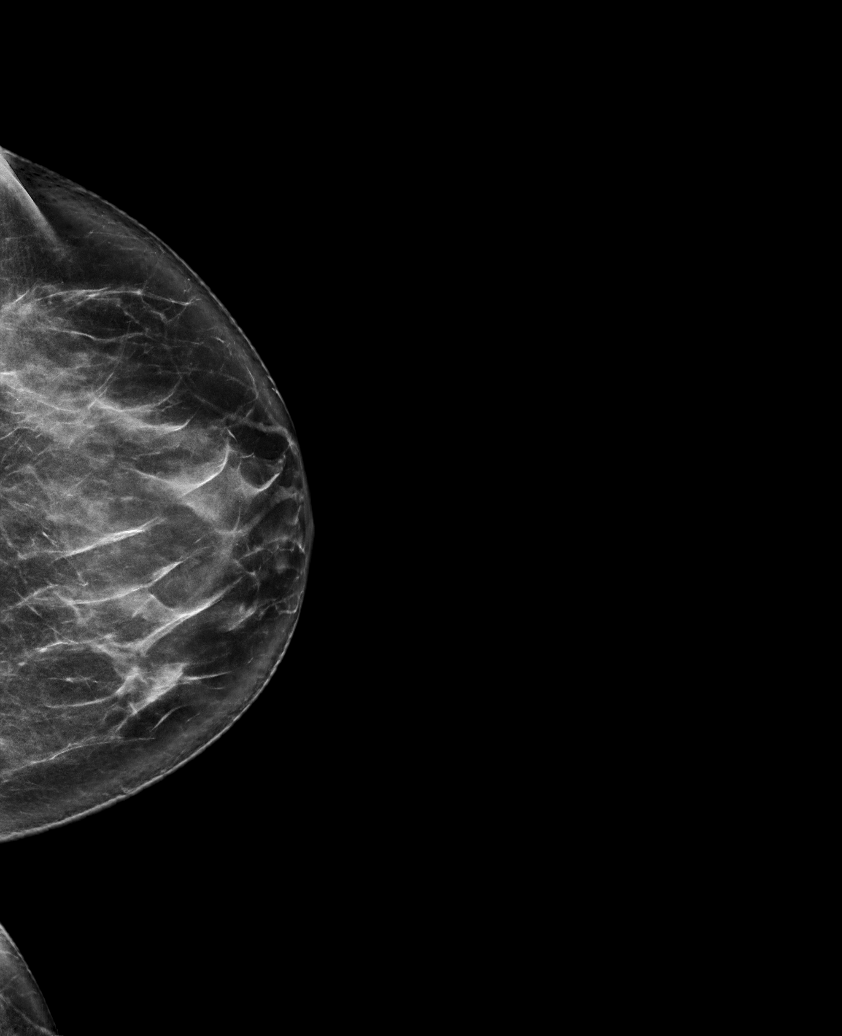

[R MLO synth-2D (1 of 2)]
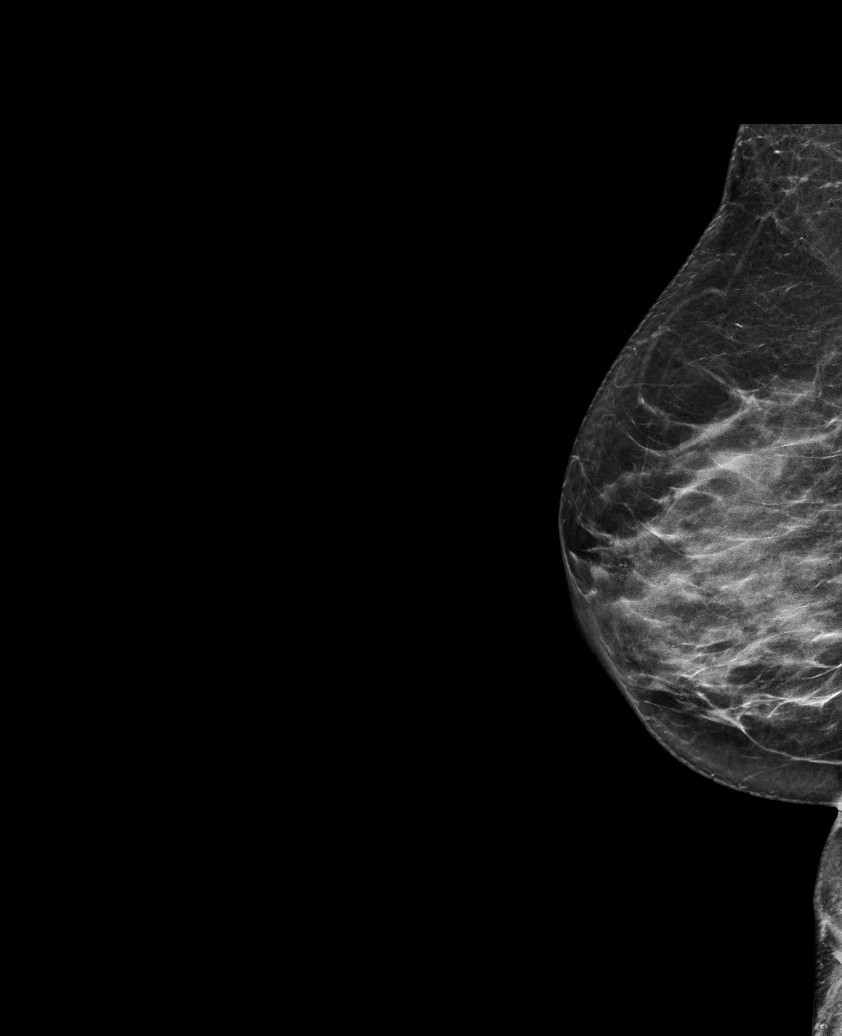

[R MLO synth-2D (2 of 2)]
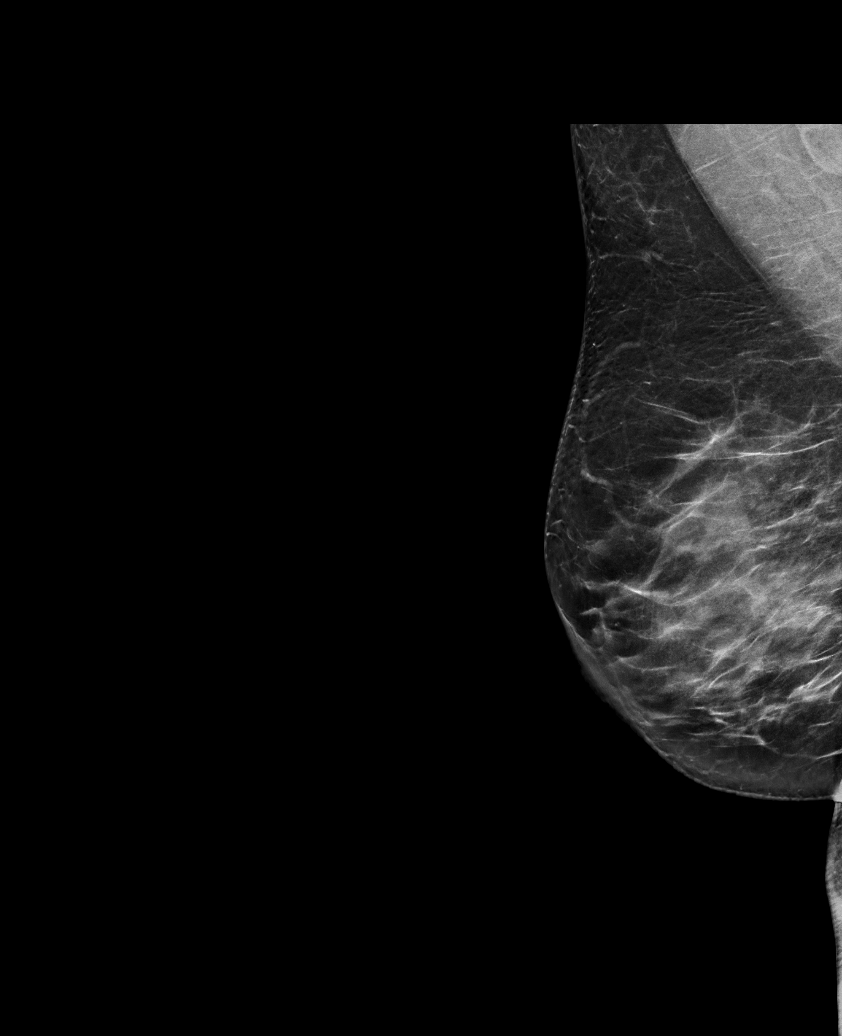

[R CC synth-2D]
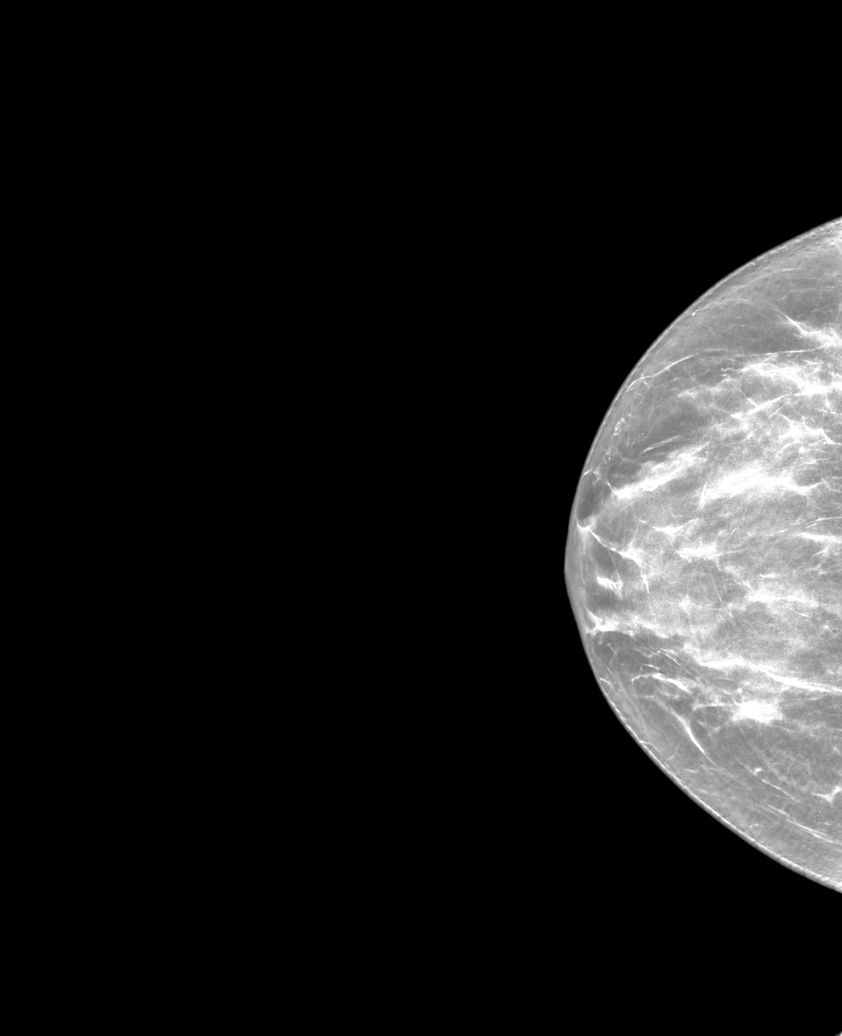

[L MLO synth-2D (1 of 2)]
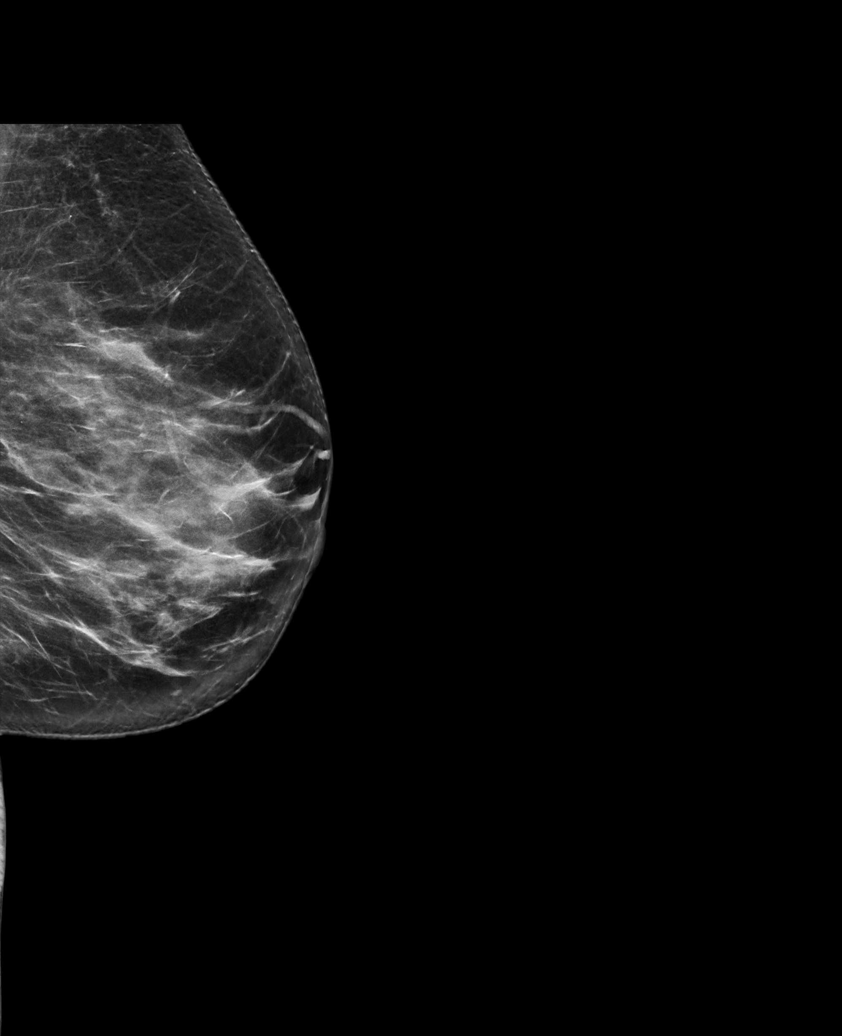

[L MLO synth-2D (2 of 2)]
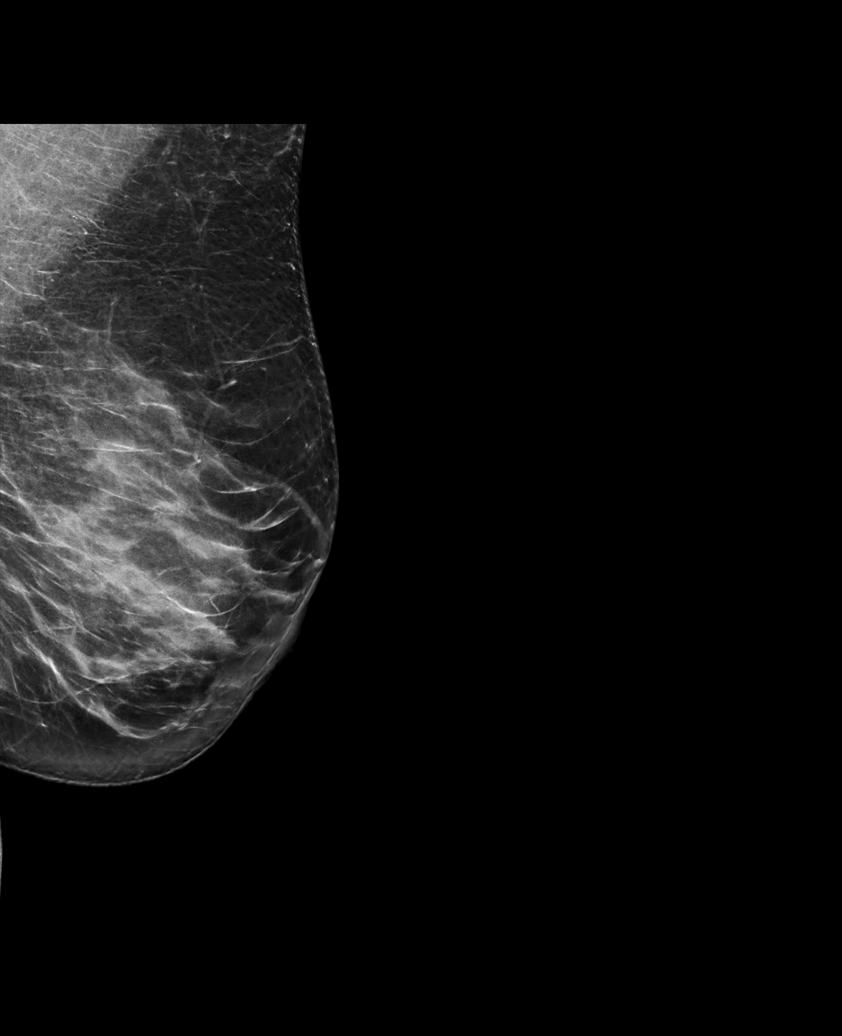

[6 of 36 positions shown; findings below may reference images not displayed]

ACR Breast Density Category d: The breast tissue is extremely dense,
which lowers the sensitivity of mammography
FINDINGS: There are no findings suspicious for malignancy. Images were
processed with CAD.
IMPRESSION: No mammographic evidence of malignancy. A result letter of this
screening mammogram will be mailed directly to the patient.

RECOMMENDATION:
Screening mammogram in one year. (Code:WO-0-ZI0)

BI-RADS CATEGORY  1: Negative.

## 2021-10-16 ENCOUNTER — Other Ambulatory Visit: Payer: Self-pay | Admitting: Cardiology

## 2021-11-25 ENCOUNTER — Encounter: Payer: Self-pay | Admitting: Cardiology

## 2021-11-25 ENCOUNTER — Ambulatory Visit (INDEPENDENT_AMBULATORY_CARE_PROVIDER_SITE_OTHER): Payer: 59 | Admitting: Cardiology

## 2021-11-25 VITALS — BP 128/80 | HR 80 | Ht 63.0 in | Wt 171.0 lb

## 2021-11-25 DIAGNOSIS — Q231 Congenital insufficiency of aortic valve: Secondary | ICD-10-CM

## 2021-11-25 DIAGNOSIS — R0609 Other forms of dyspnea: Secondary | ICD-10-CM

## 2021-11-25 DIAGNOSIS — I77810 Thoracic aortic ectasia: Secondary | ICD-10-CM | POA: Diagnosis not present

## 2021-11-25 DIAGNOSIS — I10 Essential (primary) hypertension: Secondary | ICD-10-CM

## 2021-11-25 DIAGNOSIS — E7800 Pure hypercholesterolemia, unspecified: Secondary | ICD-10-CM

## 2021-11-25 NOTE — Progress Notes (Unsigned)
Cardiology Office Note:    Date:  11/25/2021   ID:  Kristin [name], DOB Jul 24, 1960, MRN [id number]  PCP:  London Pepper, MD  Cardiologist:  Jenne Campus, MD    Referring MD: London Pepper, MD   Chief Complaint  Patient presents with   Follow-up  Doing well  History of Present Illness:    Kristin [name] is a 61 y.o. female with past medical history significant for bicuspid arctic valve, there is some suspicion for atrial septal defect however quite Kristin [name] investigation including a cardiac catheterization did not confirm that.  She does have bicuspid aortic valve as well as mildly dilated arctic root.  Also essential hypertension dyslipidemia.  Comes today to my office for follow-up doing well.  Her mother is very sick in [place] and she is very worried about it but denies have any cardiac complaints  Past Medical History:  Diagnosis Date   Allergy    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   Dyspnea    Endometriosis    Hyperlipidemia    Hypertension    IBS (irritable bowel syndrome)    Loss of hair    Pain, abdominal    Skin cancer, basal cell    Vitamin D deficiency     Past Surgical History:  Procedure Laterality Date   [place]   In [place]   CYSTOURETHROSCOPY  2016   HYSTEROSCOPY  2015   LAPAROSCOPY  03/12/2016   with resection of pelvic adhesions   LAPAROTOMY  1990   post operative infection from cholecystectomy   OOPHORECTOMY Left 2000   RIGHT/LEFT HEART CATH AND CORONARY ANGIOGRAPHY N/A 04/23/2021   Procedure: RIGHT/LEFT HEART CATH AND CORONARY ANGIOGRAPHY;  Surgeon: Belva Crome, MD;  Location: [facility];  Service: Cardiovascular;  Laterality: N/A;    Current Medications: Current Meds  Medication Sig   aspirin EC 81 MG tablet Take 1 tablet (81 mg total) by mouth daily. Swallow whole.   Biotin 5 MG CAPS Take 1 capsule by mouth daily in the afternoon.   Boswellia-Glucosamine-Vit D  (OSTEO BI-FLEX ONE PER DAY PO) Take 1 tablet by mouth daily in the afternoon. Unknown strength   calcium carbonate (OS-CAL) 600 MG TABS tablet Take 600 mg by mouth daily in the afternoon.   cetirizine (ZYRTEC) 10 MG tablet Take 10 mg by mouth daily as needed for allergies.   Cholecalciferol (VITAMIN D3) 50 MCG (2000 UT) TABS Take 2,000 Units by mouth daily in the afternoon.   clobetasol (TEMOVATE) 0.05 % external solution Apply 1 application topically 2 (two) times daily as needed (skin irritation.).   Coenzyme Q10 (COQ-10) 100 MG CAPS Take 1 capsule by mouth daily in the afternoon.   estradiol (ESTRACE) 0.5 MG tablet Take 1 tablet (0.5 mg total) by mouth daily.   Evening Primrose Oil 1000 MG CAPS Take 1 capsule by mouth at bedtime.   finasteride (PROSCAR) 5 MG tablet Take 5 mg by mouth daily at 6 PM.   hyoscyamine (LEVBID) 0.375 MG 12 hr tablet Take 0.375 mg by mouth at bedtime.   ibuprofen (ADVIL,MOTRIN) 200 MG tablet Take 200-600 mg by mouth 2 (two) times daily as needed for mild pain or moderate pain (pain).   metoprolol tartrate (LOPRESSOR) 25 MG tablet Take 1 tablet (25 mg total) by mouth 2 (two) times daily.   Multiple Vitamin (MULTIVITAMIN WITH MINERALS) TABS tablet Take 1 tablet by mouth daily in the  afternoon. Unknown strength   progesterone (PROMETRIUM) 200 MG capsule Take 1 capsule (200 mg total) by mouth daily.   rosuvastatin (CRESTOR) 5 MG tablet Take 1 tablet (5 mg total) by mouth daily.   vitamin E 400 UNIT capsule Take 400 Units by mouth daily in the afternoon.   Current Facility-Administered Medications for the 11/25/21 encounter (Office Visit) with Park Liter, MD  Medication   0.9 %  sodium chloride infusion     Allergies:   Nickel, Other, Oxybutynin chloride er, Diclofenac sodium, Sporanox [itraconazole], and Trospium   Social History   Socioeconomic History   Marital status: Married    Spouse name: Not on file   Number of children: 1   Years of education:  Not on file   Highest education level: Not on file  Occupational History   Not on file  Tobacco Use   Smoking status: Former    Packs/day: 1.00    Years: 10.00    Total pack years: 10.00    Types: Cigarettes    Quit date: 05/19/2004    Years since quitting: 17.5   Smokeless tobacco: Never  Vaping Use   Vaping Use: Never used  Substance and Sexual Activity   Alcohol use: No   Drug use: No   Sexual activity: Not Currently    Birth control/protection: Post-menopausal  Other Topics Concern   Not on file  Social History Narrative   Not on file   Social Determinants of Health   Financial Resource Strain: Not on file  Food Insecurity: Not on file  Transportation Needs: Not on file  Physical Activity: Not on file  Stress: Not on file  Social Connections: Not on file     Family History: The patient's family history includes Diabetes in her mother; Heart disease in her father; Kidney cancer in her mother; Liver disease in her father. ROS:   Please see the history of present illness.    All 14 point review of systems negative except as described per history of present illness  EKGs/Labs/Other Studies Reviewed:      Recent Labs: 04/19/2021: BUN 23; Creatinine, Ser 0.62; Platelets 272 04/23/2021: Hemoglobin 12.2; Potassium 3.7; Sodium 142  Recent Lipid Panel    Component Value Date/Time   CHOL 177 05/16/2019 0754   TRIG 133 05/16/2019 0754   HDL 77 05/16/2019 0754   CHOLHDL 2.3 05/16/2019 0754   LDLCALC 77 05/16/2019 0754    Physical Exam:    VS:  BP 136/86   Pulse 80   Ht '5\' 3"'$  (1.6 m)   Wt 171 lb (77.6 kg)   LMP 12/28/2010   SpO2 96%   BMI 30.29 kg/m     Wt Readings from Last 3 Encounters:  11/25/21 171 lb (77.6 kg)  06/05/21 172 lb (78 kg)  04/23/21 170 lb (77.1 kg)     GEN:  Well nourished, well developed in no acute distress HEENT: Normal NECK: No JVD; No carotid bruits LYMPHATICS: No lymphadenopathy CARDIAC: RRR, no murmurs, no rubs, no  gallops RESPIRATORY:  Clear to auscultation without rales, wheezing or rhonchi  ABDOMEN: Soft, non-tender, non-distended MUSCULOSKELETAL:  No edema; No deformity  SKIN: Warm and dry LOWER EXTREMITIES: no swelling NEUROLOGIC:  Alert and oriented x 3 PSYCHIATRIC:  Normal affect   ASSESSMENT:    1. Bicuspid aortic valve   2. Aortic root dilatation (HCC)   3. Primary hypertension   4. Dyspnea on exertion   5. High cholesterol    PLAN:  In order of problems listed above:  Bicuspid aortic valve no significant stenosis no regurgitation continue monitoring next year we will do echocardiogram Dilated aortic root we will recheck next time Essential hypertension blood pressure well controlled continue present management. Dyslipidemia I did review K PN which show me LDL of 64 HDL 62 continue present management. She does have swelling of her fingers as well as her face.  I will do complete metabolic panel looking basically at protein and albumin   Medication Adjustments/Labs and Tests Ordered: Current medicines are reviewed at length with the patient today.  Concerns regarding medicines are outlined above.  No orders of the defined types were placed in this encounter.  Medication changes: No orders of the defined types were placed in this encounter.   Signed, Park Liter, MD, [facility] 11/25/2021 1:24 PM    [place]

## 2021-11-25 NOTE — Patient Instructions (Addendum)
Medication Instructions:  Your physician recommends that you continue on your current medications as directed. Please refer to the Current Medication list given to you today.  *If you need a refill on your cardiac medications before your next appointment, please call your pharmacy*   Lab Work: CMP - today 2nd [place] [address] If you have labs (blood work) drawn today and your tests are completely normal, you will receive your results only by: [place] (if you have MyChart) OR A paper copy in the mail If you have any lab test that is abnormal or we need to change your treatment, we will call you to review the results.   Testing/Procedures: None Ordered   Follow-Up: At [facility], you and your health needs are our priority.  As part of our continuing mission to provide you with exceptional heart care, we have created designated Provider Care Teams.  These Care Teams include your primary Cardiologist (physician) and Advanced Practice Providers (APPs -  Physician Assistants and Nurse Practitioners) who all work together to provide you with the care you need, when you need it.  We recommend signing up for the patient portal called "MyChart".  Sign up information is provided on this After Visit Summary.  MyChart is used to connect with patients for Virtual Visits (Telemedicine).  Patients are able to view lab/test results, encounter notes, upcoming appointments, etc.  Non-urgent messages can be sent to your provider as well.   To learn more about what you can do with MyChart, go to [email or web address].    Your next appointment:   6 month(s)  The format for your next appointment:   In Person  Provider:   Jenne Campus, MD    Other Instructions NA

## 2021-12-05 LAB — COMPREHENSIVE METABOLIC PANEL
ALT: 21 IU/L (ref 0–32)
AST: 20 IU/L (ref 0–40)
Albumin/Globulin Ratio: 2 (ref 1.2–2.2)
Albumin: 4.7 g/dL (ref 3.8–4.9)
Alkaline Phosphatase: 52 IU/L (ref 44–121)
BUN/Creatinine Ratio: 32 — ABNORMAL HIGH (ref 12–28)
BUN: 19 mg/dL (ref 8–27)
Bilirubin Total: 0.6 mg/dL (ref 0.0–1.2)
CO2: 23 mmol/L (ref 20–29)
Calcium: 9.4 mg/dL (ref 8.7–10.3)
Chloride: 99 mmol/L (ref 96–106)
Creatinine, Ser: 0.6 mg/dL (ref 0.57–1.00)
Globulin, Total: 2.3 g/dL (ref 1.5–4.5)
Glucose: 96 mg/dL (ref 70–99)
Potassium: 4.2 mmol/L (ref 3.5–5.2)
Sodium: 136 mmol/L (ref 134–144)
Total Protein: 7 g/dL (ref 6.0–8.5)
eGFR: 103 mL/min/{1.73_m2} (ref >59)

## 2022-01-27 ENCOUNTER — Other Ambulatory Visit: Payer: Self-pay | Admitting: Obstetrics & Gynecology

## 2022-01-27 DIAGNOSIS — Z1231 Encounter for screening mammogram for malignant neoplasm of breast: Secondary | ICD-10-CM

## 2022-03-12 ENCOUNTER — Ambulatory Visit
Admission: RE | Admit: 2022-03-12 | Discharge: 2022-03-12 | Disposition: A | Discharge status: Home / Self Care | Payer: 59 | Source: Ambulatory Visit | Attending: Obstetrics & Gynecology | Admitting: Obstetrics & Gynecology

## 2022-03-12 DIAGNOSIS — Z1231 Encounter for screening mammogram for malignant neoplasm of breast: Secondary | ICD-10-CM

## 2022-03-18 ENCOUNTER — Ambulatory Visit (HOSPITAL_BASED_OUTPATIENT_CLINIC_OR_DEPARTMENT_OTHER): Payer: 59 | Admitting: Obstetrics & Gynecology

## 2022-03-29 ENCOUNTER — Other Ambulatory Visit (HOSPITAL_BASED_OUTPATIENT_CLINIC_OR_DEPARTMENT_OTHER): Payer: Self-pay | Admitting: Obstetrics & Gynecology

## 2022-03-29 DIAGNOSIS — Z79890 Hormone replacement therapy (postmenopausal): Secondary | ICD-10-CM

## 2022-04-13 ENCOUNTER — Other Ambulatory Visit: Payer: Self-pay | Admitting: Cardiology

## 2022-04-14 NOTE — Telephone Encounter (Signed)
Rx refill sent to pharmacy. 

## 2022-04-23 ENCOUNTER — Other Ambulatory Visit: Payer: Self-pay | Admitting: Cardiology

## 2022-04-23 DIAGNOSIS — Q211 Atrial septal defect, unspecified: Secondary | ICD-10-CM

## 2022-04-23 DIAGNOSIS — E785 Hyperlipidemia, unspecified: Secondary | ICD-10-CM

## 2022-05-07 ENCOUNTER — Other Ambulatory Visit (HOSPITAL_BASED_OUTPATIENT_CLINIC_OR_DEPARTMENT_OTHER): Payer: Self-pay | Admitting: *Deleted

## 2022-05-07 DIAGNOSIS — Z79890 Hormone replacement therapy (postmenopausal): Secondary | ICD-10-CM

## 2022-05-07 MED ORDER — PROGESTERONE 200 MG PO CAPS: 200.0000 mg | ORAL_CAPSULE | Freq: Every day | ORAL | 0 refills | Status: DC

## 2022-05-07 NOTE — Progress Notes (Signed)
Pharmacy requests refill on progesterone. Refill sent. Pt has appt in January

## 2022-05-23 ENCOUNTER — Ambulatory Visit (INDEPENDENT_AMBULATORY_CARE_PROVIDER_SITE_OTHER): Payer: 59 | Admitting: Obstetrics & Gynecology

## 2022-05-23 ENCOUNTER — Encounter (HOSPITAL_BASED_OUTPATIENT_CLINIC_OR_DEPARTMENT_OTHER): Payer: Self-pay | Admitting: Obstetrics & Gynecology

## 2022-05-23 VITALS — BP 132/82 | HR 51 | Ht 63.0 in | Wt 169.0 lb

## 2022-05-23 DIAGNOSIS — Z01419 Encounter for gynecological examination (general) (routine) without abnormal findings: Secondary | ICD-10-CM

## 2022-05-23 DIAGNOSIS — Z8742 Personal history of other diseases of the female genital tract: Secondary | ICD-10-CM | POA: Diagnosis not present

## 2022-05-23 DIAGNOSIS — Q211 Atrial septal defect, unspecified: Secondary | ICD-10-CM | POA: Diagnosis not present

## 2022-05-23 DIAGNOSIS — Z79890 Hormone replacement therapy (postmenopausal): Secondary | ICD-10-CM

## 2022-05-23 DIAGNOSIS — Q231 Congenital insufficiency of aortic valve: Secondary | ICD-10-CM

## 2022-05-23 MED ORDER — PROGESTERONE 200 MG PO CAPS: 200.0000 mg | ORAL_CAPSULE | Freq: Every day | ORAL | 3 refills | Status: DC

## 2022-05-23 MED ORDER — ESTRADIOL 0.5 MG PO TABS: 0.5000 mg | ORAL_TABLET | Freq: Every day | ORAL | 4 refills | Status: DC

## 2022-05-23 NOTE — Progress Notes (Addendum)
62 y.o. G60P1001 Married White or Caucasian female here for annual exam.  Mother passed in November.  She was in [place].  Pt was able to see her for two months before she passed but had to come back home.  Then went back for the funeral.  She feels this is a great loss for her.  Grief counseling discussed.    Her husband started a new job last July.  This was a good change for him.    Sees Dr. Agustin Cree.  Had echo, MRI, catheterization in 2022.  Has bicuspic aortic valve.  No evidence of ASD on MRI.  Denies vaginal bleeding.    Patient's last menstrual period was 12/28/2010.          Sexually active: No.  The current method of family planning is post menopausal status.    Smoker:  no  Health Maintenance: Pap:  02/2021 History of abnormal Pap:  no MMG:  03/12/2022 Colonoscopy:  01/01/2016, follow up 10 years BMD:   not done yet Screening Labs: Dr. Orland Mustard   reports that she quit smoking about 18 years ago. Her smoking use included cigarettes. She has a 10.00 pack-year smoking history. She has never used smokeless tobacco. She reports that she does not drink alcohol and does not use drugs.  Past Medical History:  Diagnosis Date   Allergy    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   Dyspnea    Endometriosis    Hyperlipidemia    Hypertension    IBS (irritable bowel syndrome)    Loss of hair    Pain, abdominal    Skin cancer, basal cell    Vitamin D deficiency     Past Surgical History:  Procedure Laterality Date   [place]   In [place]   CYSTOURETHROSCOPY  2016   HYSTEROSCOPY  2015   LAPAROSCOPY  03/12/2016   with resection of pelvic adhesions   LAPAROTOMY  1990   post operative infection from cholecystectomy   OOPHORECTOMY Left 2000   RIGHT/LEFT HEART CATH AND CORONARY ANGIOGRAPHY N/A 04/23/2021   Procedure: RIGHT/LEFT HEART CATH AND CORONARY ANGIOGRAPHY;  Surgeon: Belva Crome, MD;  Location: [facility];  Service: Cardiovascular;  Laterality: N/A;    Current Outpatient Medications  Medication Sig Dispense Refill   aspirin EC 81 MG tablet Take 1 tablet (81 mg total) by mouth daily. Swallow whole. 90 tablet 3   Biotin 5 MG CAPS Take 1 capsule by mouth daily in the afternoon.     Boswellia-Glucosamine-Vit D (OSTEO BI-FLEX ONE PER DAY PO) Take 1 tablet by mouth daily in the afternoon. Unknown strength     calcium carbonate (OS-CAL) 600 MG TABS tablet Take 600 mg by mouth daily in the afternoon.     cetirizine (ZYRTEC) 10 MG tablet Take 10 mg by mouth daily as needed for allergies.     Cholecalciferol (VITAMIN D3) 50 MCG (2000 UT) TABS Take 2,000 Units by mouth daily in the afternoon.     clobetasol (TEMOVATE) 0.05 % external solution Apply 1 application topically 2 (two) times daily as needed (skin irritation.).  0   Coenzyme Q10 (COQ-10) 100 MG CAPS Take 1 capsule by mouth daily in the afternoon.     Evening Primrose Oil 1000 MG CAPS Take 1 capsule by mouth at bedtime.     finasteride (PROSCAR) 5 MG tablet Take 5 mg by mouth daily at 6  PM.     hyoscyamine (LEVBID) 0.375 MG 12 hr tablet Take 0.375 mg by mouth at bedtime.  0   ibuprofen (ADVIL,MOTRIN) 200 MG tablet Take 200-600 mg by mouth 2 (two) times daily as needed for mild pain or moderate pain (pain).     metoprolol tartrate (LOPRESSOR) 25 MG tablet TAKE 1 TABLET BY MOUTH TWICE A DAY 180 tablet 1   Multiple Vitamin (MULTIVITAMIN WITH MINERALS) TABS tablet Take 1 tablet by mouth daily in the afternoon. Unknown strength     rosuvastatin (CRESTOR) 5 MG tablet Take 1 tablet (5 mg total) by mouth daily. 90 tablet 1   vitamin E 400 UNIT capsule Take 400 Units by mouth daily in the afternoon.     estradiol (ESTRACE) 0.5 MG tablet Take 1 tablet (0.5 mg total) by mouth daily. 90 tablet 4   progesterone (PROMETRIUM) 200 MG capsule Take 1 capsule (200 mg total) by mouth daily. 90 capsule 3   Current Facility-Administered Medications  Medication  Dose Route Frequency Provider Last Rate Last Admin   0.9 %  sodium chloride infusion  500 mL Intravenous Continuous Ladene Artist, MD        Family History  Problem Relation Age of Onset   Heart disease Father    Liver disease Father    Diabetes Mother    Kidney cancer Mother     ROS: Constitutional: negative Genitourinary:negative  Exam:   BP 132/82   Pulse (!) 51   Ht 5\' 3"  (1.6 m)   Wt 169 lb (76.7 kg)   LMP 12/28/2010   BMI 29.94 kg/m   Height: 5\' 3"  (160 cm)  General appearance: alert, cooperative and appears stated age Head: Normocephalic, without obvious abnormality, atraumatic Neck: no adenopathy, supple, symmetrical, trachea midline and thyroid normal to inspection and palpation Lungs: clear to auscultation bilaterally Breasts: normal appearance, no masses or tenderness Heart: regular rate and rhythm Abdomen: soft, non-tender; bowel sounds normal; no masses,  no organomegaly Extremities: extremities normal, atraumatic, no cyanosis or edema Skin: Skin color, texture, turgor normal. No rashes or lesions Lymph nodes: Cervical, supraclavicular, and axillary nodes normal. No abnormal inguinal nodes palpated Neurologic: Grossly normal   Pelvic: External genitalia:  no lesions              Urethra:  normal appearing urethra with no masses, tenderness or lesions              Bartholins and Skenes: normal                 Vagina: normal appearing vagina with normal color and no discharge, no lesions              Cervix: no lesions              Pap taken: No. Bimanual Exam:  Uterus:  normal size, contour, position, consistency, mobility, non-tender              Adnexa: normal adnexa and no mass, fullness, tenderness               Rectovaginal: Confirms               Anus:  normal sphincter tone, no lesions  Chaperone, Octaviano Batty, CMA, was present for exam.  Assessment/Plan: 1. Well woman exam with routine gynecological exam - Pap smear 02/2021 - Mammogram  03/12/2022 - Colonoscopy 01/01/2016, follow up 10 years - Bone mineral density discussed.  Will plan before pt turns 65. -  lab work done with PCP, Dr. Orland Mustard - vaccines reviewed/updated  2. Hormone replacement therapy (HRT) - will not make any changes this year - estradiol (ESTRACE) 0.5 MG tablet; Take 1 tablet (0.5 mg total) by mouth daily.  Dispense: 90 tablet; Refill: 4 - progesterone (PROMETRIUM) 200 MG capsule; Take 1 capsule (200 mg total) by mouth daily.  Dispense: 90 capsule; Refill: 3  3. History of endometriosis  4. Bicuspid aortic valve

## 2022-10-28 ENCOUNTER — Other Ambulatory Visit: Payer: Self-pay | Admitting: Cardiology

## 2022-10-28 NOTE — Telephone Encounter (Signed)
Rx sent , needs appointment for future refills

## 2022-10-30 ENCOUNTER — Other Ambulatory Visit: Payer: Self-pay | Admitting: Cardiology

## 2022-10-30 DIAGNOSIS — E785 Hyperlipidemia, unspecified: Secondary | ICD-10-CM

## 2022-10-30 DIAGNOSIS — Q211 Atrial septal defect, unspecified: Secondary | ICD-10-CM

## 2022-11-06 ENCOUNTER — Other Ambulatory Visit: Payer: Self-pay | Admitting: Cardiology

## 2022-11-06 DIAGNOSIS — E785 Hyperlipidemia, unspecified: Secondary | ICD-10-CM

## 2022-11-06 DIAGNOSIS — Q211 Atrial septal defect, unspecified: Secondary | ICD-10-CM

## 2022-11-28 ENCOUNTER — Other Ambulatory Visit: Payer: Self-pay | Admitting: Cardiology

## 2022-11-28 DIAGNOSIS — E785 Hyperlipidemia, unspecified: Secondary | ICD-10-CM

## 2022-11-28 DIAGNOSIS — Q211 Atrial septal defect, unspecified: Secondary | ICD-10-CM

## 2023-01-16 DIAGNOSIS — Z85828 Personal history of other malignant neoplasm of skin: Secondary | ICD-10-CM | POA: Insufficient documentation

## 2023-01-16 HISTORY — DX: Personal history of other malignant neoplasm of skin: Z85.828

## 2023-01-20 ENCOUNTER — Encounter: Payer: Self-pay | Admitting: Cardiology

## 2023-01-20 ENCOUNTER — Ambulatory Visit: Payer: 59 | Attending: Cardiology | Admitting: Cardiology

## 2023-01-20 VITALS — BP 130/90 | HR 65 | Ht 63.0 in | Wt 175.0 lb

## 2023-01-20 DIAGNOSIS — I10 Essential (primary) hypertension: Secondary | ICD-10-CM

## 2023-01-20 DIAGNOSIS — Q231 Congenital insufficiency of aortic valve: Secondary | ICD-10-CM

## 2023-01-20 DIAGNOSIS — R0609 Other forms of dyspnea: Secondary | ICD-10-CM | POA: Diagnosis not present

## 2023-01-20 DIAGNOSIS — I77810 Thoracic aortic ectasia: Secondary | ICD-10-CM | POA: Diagnosis not present

## 2023-01-20 MED ORDER — ALPRAZOLAM 0.25 MG PO TABS: 0.2500 mg | ORAL_TABLET | Freq: Three times a day (TID) | ORAL | 0 refills | Status: DC | PRN

## 2023-01-20 NOTE — Progress Notes (Signed)
Cardiology Office Note:    Date:  01/20/2023   ID:  Lonna Duval, DOB 06/07/1960, MRN [id number]  PCP:  Farris Has, MD  Cardiologist:  Gypsy Balsam, MD    Referring MD: Farris Has, MD   Chief Complaint  Patient presents with   yearly follow up  Doing fine  History of Present Illness:    Kristin [name] is a 62 y.o. female past medical history significant for bicuspid aortic valve, mildly enlarged aortic root, do some question about ASD however cardiac catheterization did not show any evidence of shunting.  Comes today to months for follow-up overall doing very well.  She lost her mother recently obviously grieving after that.  Want me to give her some medication to help her cope with the situation.  Denies have any chest pain tightness squeezing pressure burning chest, some shortness of breath is present.  Past Medical History:  Diagnosis Date   Allergy    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   Dyspnea    Endometriosis    Hyperlipidemia    Hypertension    IBS (irritable bowel syndrome)    Loss of hair    Pain, abdominal    Skin cancer, basal cell    Vitamin D deficiency     Past Surgical History:  Procedure Laterality Date   CESAREAN SECTION  1983   CHOLECYSTECTOMY  1990   In [place]   CYSTOURETHROSCOPY  2016   HYSTEROSCOPY  2015   LAPAROSCOPY  03/12/2016   with resection of pelvic adhesions   LAPAROTOMY  1990   post operative infection from cholecystectomy   OOPHORECTOMY Left 2000   RIGHT/LEFT HEART CATH AND CORONARY ANGIOGRAPHY N/A 04/23/2021   Procedure: RIGHT/LEFT HEART CATH AND CORONARY ANGIOGRAPHY;  Surgeon: Lyn Records, MD;  Location: [facility];  Service: Cardiovascular;  Laterality: N/A;   SKIN CANCER EXCISION     facial    Current Medications: Current Meds  Medication Sig   aspirin EC 81 MG tablet Take 1 tablet (81 mg total) by mouth daily. Swallow whole.   Biotin 5 MG CAPS Take 1 capsule by mouth  daily in the afternoon.   Boswellia-Glucosamine-Vit D (OSTEO BI-FLEX ONE PER DAY PO) Take 1 tablet by mouth daily in the afternoon. Unknown strength   calcium carbonate (OS-CAL) 600 MG TABS tablet Take 600 mg by mouth daily in the afternoon.   cetirizine (ZYRTEC) 10 MG tablet Take 10 mg by mouth daily as needed for allergies.   Cholecalciferol (VITAMIN D3) 50 MCG (2000 UT) TABS Take 2,000 Units by mouth daily in the afternoon.   clobetasol (TEMOVATE) 0.05 % external solution Apply 1 application topically 2 (two) times daily as needed (skin irritation.).   Coenzyme Q10 (COQ-10) 100 MG CAPS Take 1 capsule by mouth daily in the afternoon.   estradiol (ESTRACE) 0.5 MG tablet Take 1 tablet (0.5 mg total) by mouth daily.   Evening Primrose Oil 1000 MG CAPS Take 1 capsule by mouth at bedtime.   finasteride (PROSCAR) 5 MG tablet Take 5 mg by mouth daily at 6 PM.   hyoscyamine (LEVBID) 0.375 MG 12 hr tablet Take 0.375 mg by mouth at bedtime.   ibuprofen (ADVIL,MOTRIN) 200 MG tablet Take 200-600 mg by mouth 2 (two) times daily as needed for mild pain or moderate pain (pain).   metoprolol tartrate (LOPRESSOR) 25 MG tablet TAKE 1 TABLET BY MOUTH 2 TIMES A DAY   Multiple Vitamin (MULTIVITAMIN WITH MINERALS)  TABS tablet Take 1 tablet by mouth daily in the afternoon. Unknown strength   progesterone (PROMETRIUM) 200 MG capsule Take 1 capsule (200 mg total) by mouth daily.   rosuvastatin (CRESTOR) 5 MG tablet TAKE 1 TABLET BY MOUTH DAILY   vitamin E 400 UNIT capsule Take 400 Units by mouth daily in the afternoon.   Current Facility-Administered Medications for the 01/20/23 encounter (Office Visit) with Georgeanna Lea, MD  Medication   0.9 %  sodium chloride infusion     Allergies:   Trospium chloride, Azithromycin, Nickel, Other, Oxybutynin, Oxybutynin chloride er, Diclofenac sodium, Itraconazole, Oxybutynin chloride, and Trospium   Social History   Socioeconomic History   Marital status: Married     Spouse name: Not on file   Number of children: 1   Years of education: Not on file   Highest education level: Not on file  Occupational History   Not on file  Tobacco Use   Smoking status: Former    Current packs/day: 0.00    Average packs/day: 1 pack/day for 10.0 years (10.0 ttl pk-yrs)    Types: Cigarettes    Start date: 05/19/1994    Quit date: 05/19/2004    Years since quitting: 18.6   Smokeless tobacco: Never  Vaping Use   Vaping status: Never Used  Substance and Sexual Activity   Alcohol use: No   Drug use: No   Sexual activity: Not Currently    Birth control/protection: Post-menopausal  Other Topics Concern   Not on file  Social History Narrative   Not on file   Social Determinants of Health   Financial Resource Strain: Not on file  Food Insecurity: Not on file  Transportation Needs: Not on file  Physical Activity: Not on file  Stress: Not on file  Social Connections: Not on file     Family History: The patient's family history includes Diabetes in her mother; Heart disease in her father; Kidney cancer in her mother; Liver disease in her father. ROS:   Please see the history of present illness.    All 14 point review of systems negative except as described per history of present illness  EKGs/Labs/Other Studies Reviewed:         Recent Labs: No results found for requested labs within last 365 days.  Recent Lipid Panel    Component Value Date/Time   CHOL 177 05/16/2019 0754   TRIG 133 05/16/2019 0754   HDL 77 05/16/2019 0754   CHOLHDL 2.3 05/16/2019 0754   LDLCALC 77 05/16/2019 0754    Physical Exam:    VS:  BP (!) 130/90 (BP Location: Right Arm, Patient Position: Sitting)   Pulse 65   Ht 5\' 3"  (1.6 m)   Wt 175 lb (79.4 kg)   LMP 12/28/2010   SpO2 95%   BMI 31.00 kg/m     Wt Readings from Last 3 Encounters:  01/20/23 175 lb (79.4 kg)  05/23/22 169 lb (76.7 kg)  11/25/21 171 lb (77.6 kg)     GEN:  Well nourished, well developed in no acute  distress HEENT: Normal NECK: No JVD; No carotid bruits LYMPHATICS: No lymphadenopathy CARDIAC: RRR, systolic ejection murmur grade 1/6 best heard right upper portion of the sternum, S2 is still present, no rubs, no gallops RESPIRATORY:  Clear to auscultation without rales, wheezing or rhonchi  ABDOMEN: Soft, non-tender, non-distended MUSCULOSKELETAL:  No edema; No deformity  SKIN: Warm and dry LOWER EXTREMITIES: no swelling NEUROLOGIC:  Alert and oriented x 3 PSYCHIATRIC:  Normal affect   ASSESSMENT:    1. Bicuspid aortic valve   2. Aortic root dilatation (HCC)   3. Primary hypertension   4. Dyspnea on exertion    PLAN:    In order of problems listed above:  Bicuspid aortic valve, echocardiogram will be done to reassess the valve. Aortic root dilatation again echocardiogram will be done to reassess the lesion. Essential hypertension well-controlled continue present management. Dyslipidemia I did review K PN which show LDL 83 HDL 63 this is from March of this year.  She did not have any coronary artery disease doing well from that point review. Dyspnea exertion denies having any. Grieving after her mother passing.  Will give her 15 tablets of Xanax 0.25 to cope with the situation   Medication Adjustments/Labs and Tests Ordered: Current medicines are reviewed at length with the patient today.  Concerns regarding medicines are outlined above.  Orders Placed This Encounter  Procedures   EKG 12-Lead   Medication changes: No orders of the defined types were placed in this encounter.   Signed, Georgeanna Lea, MD, [facility] 01/20/2023 9:02 AM    [facility]

## 2023-01-20 NOTE — Patient Instructions (Signed)
Medication Instructions:   START: Xanax .25mg  1 tablet as needed every 8 hours   Lab Work: None Ordered If you have labs (blood work) drawn today and your tests are completely normal, you will receive your results only by: MyChart Message (if you have MyChart) OR A paper copy in the mail If you have any lab test that is abnormal or we need to change your treatment, we will call you to review the results.   Testing/Procedures: Your physician has requested that you have an echocardiogram. Echocardiography is a painless test that uses sound waves to create images of your heart. It provides your doctor with information about the size and shape of your heart and how well your heart's chambers and valves are working. This procedure takes approximately one hour. There are no restrictions for this procedure. Please do NOT wear cologne, perfume, aftershave, or lotions (deodorant is allowed). Please arrive 15 minutes prior to your appointment time.    Follow-Up: At [facility], you and your health needs are our priority.  As part of our continuing mission to provide you with exceptional heart care, we have created designated Provider Care Teams.  These Care Teams include your primary Cardiologist (physician) and Advanced Practice Providers (APPs -  Physician Assistants and Nurse Practitioners) who all work together to provide you with the care you need, when you need it.  We recommend signing up for the patient portal called "MyChart".  Sign up information is provided on this After Visit Summary.  MyChart is used to connect with patients for Virtual Visits (Telemedicine).  Patients are able to view lab/test results, encounter notes, upcoming appointments, etc.  Non-urgent messages can be sent to your provider as well.   To learn more about what you can do with MyChart, go to [email or web address].    Your next appointment:   6 month(s)  The format for your next appointment:   In  Person  Provider:   Gypsy Balsam, MD    Other Instructions NA          .

## 2023-01-27 ENCOUNTER — Other Ambulatory Visit: Payer: Self-pay | Admitting: Obstetrics & Gynecology

## 2023-01-27 DIAGNOSIS — Z1231 Encounter for screening mammogram for malignant neoplasm of breast: Secondary | ICD-10-CM

## 2023-02-19 ENCOUNTER — Ambulatory Visit (HOSPITAL_BASED_OUTPATIENT_CLINIC_OR_DEPARTMENT_OTHER): Payer: 59

## 2023-02-25 ENCOUNTER — Telehealth: Payer: Self-pay | Admitting: Cardiology

## 2023-02-25 NOTE — Telephone Encounter (Signed)
New Message:     Patient called and wanted to know if there was another test that she could do in the place of the Echo. She said the Echo was too expensive.

## 2023-02-27 ENCOUNTER — Emergency Department (HOSPITAL_COMMUNITY): Payer: 59

## 2023-02-27 ENCOUNTER — Encounter (HOSPITAL_COMMUNITY): Payer: Self-pay | Admitting: Emergency Medicine

## 2023-02-27 ENCOUNTER — Other Ambulatory Visit: Payer: Self-pay

## 2023-02-27 ENCOUNTER — Telehealth: Payer: Self-pay

## 2023-02-27 ENCOUNTER — Emergency Department (HOSPITAL_COMMUNITY)
Admission: EM | Admit: 2023-02-27 | Discharge: 2023-02-27 | Disposition: A | Discharge status: Home / Self Care | Payer: 59 | Attending: Emergency Medicine | Admitting: Emergency Medicine

## 2023-02-27 ENCOUNTER — Other Ambulatory Visit: Payer: Self-pay | Admitting: Cardiology

## 2023-02-27 DIAGNOSIS — R079 Chest pain, unspecified: Secondary | ICD-10-CM

## 2023-02-27 DIAGNOSIS — Z7982 Long term (current) use of aspirin: Secondary | ICD-10-CM | POA: Diagnosis not present

## 2023-02-27 DIAGNOSIS — Q211 Atrial septal defect, unspecified: Secondary | ICD-10-CM

## 2023-02-27 DIAGNOSIS — R002 Palpitations: Secondary | ICD-10-CM | POA: Insufficient documentation

## 2023-02-27 DIAGNOSIS — E785 Hyperlipidemia, unspecified: Secondary | ICD-10-CM

## 2023-02-27 LAB — CBC WITH DIFFERENTIAL/PLATELET
Abs Immature Granulocytes: 0.01 10*3/uL (ref 0.00–0.07)
Basophils Absolute: 0.1 10*3/uL (ref 0.0–0.1)
Basophils Relative: 2 %
Eosinophils Absolute: 0.1 10*3/uL (ref 0.0–0.5)
Eosinophils Relative: 2 %
HCT: 39.6 % (ref 36.0–46.0)
Hemoglobin: 13.1 g/dL (ref 12.0–15.0)
Immature Granulocytes: 0 %
Lymphocytes Relative: 33 %
Lymphs Abs: 1.8 10*3/uL (ref 0.7–4.0)
MCH: 30.2 pg (ref 26.0–34.0)
MCHC: 33.1 g/dL (ref 30.0–36.0)
MCV: 91.2 fL (ref 80.0–100.0)
Monocytes Absolute: 0.6 10*3/uL (ref 0.1–1.0)
Monocytes Relative: 10 %
Neutro Abs: 2.8 10*3/uL (ref 1.7–7.7)
Neutrophils Relative %: 53 %
Platelets: 266 10*3/uL (ref 150–400)
RBC: 4.34 MIL/uL (ref 3.87–5.11)
RDW: 11.9 % (ref 11.5–15.5)
WBC: 5.4 10*3/uL (ref 4.0–10.5)
nRBC: 0 % (ref 0.0–0.2)

## 2023-02-27 LAB — D-DIMER, QUANTITATIVE: D-Dimer, Quant: <0.27 ug{FEU}/mL (ref 0.00–0.50)

## 2023-02-27 LAB — COMPREHENSIVE METABOLIC PANEL
ALT: 27 U/L (ref 0–44)
AST: 26 U/L (ref 15–41)
Albumin: 4.1 g/dL (ref 3.5–5.0)
Alkaline Phosphatase: 42 U/L (ref 38–126)
Anion gap: 12 (ref 5–15)
BUN: 19 mg/dL (ref 8–23)
CO2: 23 mmol/L (ref 22–32)
Calcium: 9.6 mg/dL (ref 8.9–10.3)
Chloride: 104 mmol/L (ref 98–111)
Creatinine, Ser: 0.66 mg/dL (ref 0.44–1.00)
GFR, Estimated: >60 mL/min (ref >60)
Glucose, Bld: 106 mg/dL — ABNORMAL HIGH (ref 70–99)
Potassium: 4.1 mmol/L (ref 3.5–5.1)
Sodium: 139 mmol/L (ref 135–145)
Total Bilirubin: 0.6 mg/dL (ref 0.3–1.2)
Total Protein: 7 g/dL (ref 6.5–8.1)

## 2023-02-27 LAB — TROPONIN I (HIGH SENSITIVITY)
Troponin I (High Sensitivity): 4 ng/L (ref <18)
Troponin I (High Sensitivity): 4 ng/L (ref <18)

## 2023-02-27 MED ORDER — IOHEXOL 350 MG/ML SOLN
100.0000 mL | Freq: Once | INTRAVENOUS | Status: AC | PRN
  Administered 2023-02-27: 100 mL via INTRAVENOUS

## 2023-02-27 NOTE — ED Provider Triage Note (Signed)
Emergency Medicine Provider Triage Evaluation Note  Kristin [name] , a 62 y.o. female  was evaluated in triage.  Pt complains of left-sided chest and arm pain that started about a week ago as well as dyspnea on exertion.  She reports the pain is getting worse in the last few days in the left side of her chest where now she cannot lay on her left side either.  She does have a history of bicuspid valve but had a clean catheterization within the last year.  She denies significant cough, wheezing, fever.  No leg swelling.  Some nausea but denies any vomiting or diarrhea.  Review of Systems  Positive: Chest pain and dyspnea on exertion Negative: Vomiting, fever or diarrhea  Physical Exam  LMP 12/28/2010  Gen:   Awake, no distress   Resp:  Normal effort  MSK:   Moves extremities without difficulty  Other:  Regular rate, pulses equal  Medical Decision Making  Medically screening exam initiated at 10:56 AM.  Appropriate orders placed.  Kristin [name] was informed that the remainder of the evaluation will be completed by another provider, this initial triage assessment does not replace that evaluation, and the importance of remaining in the ED until their evaluation is complete.  Patient here with nonspecific chest pain with history of aortic valve that is biphasic but clean catheterization in the past.  Low risk Wells criteria.  Currently having chest discomfort not reproducible.  EKG not significantly changed today   Gwyneth Sprout, MD 02/27/23 1100

## 2023-02-27 NOTE — Telephone Encounter (Signed)
Pt walked into office with complaints of chest discomfort with radiation to left shoulder and left side of jaw.  Pt advised with current, active symptoms she will need to be further evaluated in the ED.  Offered to call EMS as pt drove herself to the office.  Encouraged pt to contact her husband to drive her if she is not willing to go by ambulance.  Pt verbalizes understanding and states she will contact her husband and will consider going to ED.

## 2023-02-27 NOTE — ED Notes (Signed)
PT ambulated to bathroom without assistance.

## 2023-02-27 NOTE — ED Provider Notes (Signed)
[name] EMERGENCY DEPARTMENT AT [facility] Provider Note   CSN: [id number] Arrival date & time: 02/27/23  1037     History  Chief Complaint  Patient presents with   Palpitations    Kristin [name] is a 62 y.o. female.  Pt is a 62y/o female with hx of bicuspic aortic valve, dilated aortic root and pulm htn who is presenting today with complaints of left-sided chest and arm pain that started about a week ago as well as dyspnea on exertion.  She reports the pain is getting worse in the last few days in the left side of her chest where now she cannot lay on her left side either.  She does have a history of bicuspid valve but had a clean catheterization within the last 2 year with echo 2 years ago with mild aortic stenosis and 40mm aortic root dilation.  She denies significant cough, wheezing, fever.  No leg swelling.  Some nausea but denies any vomiting or diarrhea.  The history is provided by the patient and medical records.  Palpitations      Home Medications Prior to Admission medications   Medication Sig Start Date End Date Taking? Authorizing Provider  ALPRAZolam (XANAX) 0.25 MG tablet Take 1 tablet (0.25 mg total) by mouth every 8 (eight) hours as needed for anxiety. 01/20/23   Georgeanna Lea, MD  aspirin EC 81 MG tablet Take 1 tablet (81 mg total) by mouth daily. Swallow whole. 04/19/21   Georgeanna Lea, MD  Biotin 5 MG CAPS Take 1 capsule by mouth daily in the afternoon. 07/20/18   [provider]  Boswellia-Glucosamine-Vit D (OSTEO BI-FLEX ONE PER DAY PO) Take 1 tablet by mouth daily in the afternoon. Unknown strength 06/19/14   [provider]  calcium carbonate (OS-CAL) 600 MG TABS tablet Take 600 mg by mouth daily in the afternoon.    [provider]  cetirizine (ZYRTEC) 10 MG tablet Take 10 mg by mouth daily as needed for allergies. 10/16/20   [provider]  Cholecalciferol (VITAMIN D3) 50 MCG (2000 UT) TABS Take 2,000  Units by mouth daily in the afternoon. 03/17/18   [provider]  clobetasol (TEMOVATE) 0.05 % external solution Apply 1 application topically 2 (two) times daily as needed (skin irritation.). 03/15/18   [provider]  Coenzyme Q10 (COQ-10) 100 MG CAPS Take 1 capsule by mouth daily in the afternoon. 07/30/14   [provider]  estradiol (ESTRACE) 0.5 MG tablet Take 1 tablet (0.5 mg total) by mouth daily. 05/23/22   Jerene Bears, MD  Evening Primrose Oil 1000 MG CAPS Take 1 capsule by mouth at bedtime. 06/19/14   [provider]  finasteride (PROSCAR) 5 MG tablet Take 5 mg by mouth daily at 6 PM. 01/17/19   [provider]  hyoscyamine (LEVBID) 0.375 MG 12 hr tablet Take 0.375 mg by mouth at bedtime. 03/10/18   [provider]  ibuprofen (ADVIL,MOTRIN) 200 MG tablet Take 200-600 mg by mouth 2 (two) times daily as needed for mild pain or moderate pain (pain).    [provider]  metoprolol tartrate (LOPRESSOR) 25 MG tablet Take 1 tablet (25 mg total) by mouth 2 (two) times daily. 02/27/23   Georgeanna Lea, MD  Multiple Vitamin (MULTIVITAMIN WITH MINERALS) TABS tablet Take 1 tablet by mouth daily in the afternoon. Unknown strength    [provider]  progesterone (PROMETRIUM) 200 MG capsule Take 1 capsule (200 mg total) by  mouth daily. 05/23/22   Jerene Bears, MD  rosuvastatin (CRESTOR) 5 MG tablet Take 1 tablet (5 mg total) by mouth daily. 02/27/23   Georgeanna Lea, MD  vitamin E 400 UNIT capsule Take 400 Units by mouth daily in the afternoon.    [provider]      Allergies    Trospium chloride, Azithromycin, Nickel, Other, Oxybutynin, Oxybutynin chloride er, Diclofenac sodium, Itraconazole, Oxybutynin chloride, and Trospium    Review of Systems   Review of Systems  Cardiovascular:  Positive for palpitations.    Physical Exam Updated Vital Signs BP (!) 146/88 (BP Location: Right Arm)   Pulse 71    Temp 98.3 F (36.8 C)   Resp 18   Ht 5\' 3"  (1.6 m)   Wt 78.9 kg   LMP 12/28/2010   SpO2 98%   BMI 30.82 kg/m  Physical Exam Vitals and nursing note reviewed.  Constitutional:      General: She is not in acute distress.    Appearance: She is well-developed.  HENT:     Head: Normocephalic and atraumatic.  Eyes:     Pupils: Pupils are equal, round, and reactive to light.  Cardiovascular:     Rate and Rhythm: Normal rate and regular rhythm.     Pulses: Normal pulses.     Heart sounds: Normal heart sounds. No murmur heard.    No friction rub.  Pulmonary:     Effort: Pulmonary effort is normal.     Breath sounds: Normal breath sounds. No wheezing or rales.  Abdominal:     General: Bowel sounds are normal. There is no distension.     Palpations: Abdomen is soft.     Tenderness: There is no abdominal tenderness. There is no guarding or rebound.  Musculoskeletal:        General: No tenderness. Normal range of motion.     Right lower leg: No edema.     Left lower leg: No edema.     Comments: No edema  Skin:    General: Skin is warm and dry.     Findings: No rash.  Neurological:     Mental Status: She is alert and oriented to person, place, and time. Mental status is at baseline.     Cranial Nerves: No cranial nerve deficit.  Psychiatric:        Behavior: Behavior normal.     ED Results / Procedures / Treatments   Labs (all labs ordered are listed, but only abnormal results are displayed) Labs Reviewed  COMPREHENSIVE METABOLIC PANEL - Abnormal; Notable for the following components:      Result Value   Glucose, Bld 106 (*)    All other components within normal limits  CBC WITH DIFFERENTIAL/PLATELET  D-DIMER, QUANTITATIVE  TROPONIN I (HIGH SENSITIVITY)  TROPONIN I (HIGH SENSITIVITY)    EKG EKG Interpretation Date/Time:  Friday February 27 2023 10:42:08 EDT Ventricular Rate:  71 PR Interval:  178 QRS Duration:  78 QT Interval:  356 QTC Calculation: 386 R  Axis:   77  Text Interpretation: Normal sinus rhythm T wave abnormality, consider inferior ischemia No significant change since last tracing When compared with ECG of 20-Jan-2023 08:10, PREVIOUS ECG IS PRESENT Confirmed by Gwyneth Sprout ([id number]) on 02/27/2023 10:55:49 AM  Radiology DG Chest 2 View  Result Date: 02/27/2023 CLINICAL DATA:  Chest pain and shortness of breath. EXAM: CHEST - 2 VIEW COMPARISON:  04/19/2021 FINDINGS: The cardiomediastinal contours are normal. Minor subsegmental scarring at  the left lung base. Pulmonary vasculature is normal. No consolidation, pleural effusion, or pneumothorax. No acute osseous abnormalities are seen. Telemetry leads overlie the chest. IMPRESSION: No acute findings.  Minor left basilar subsegmental scarring. Electronically Signed   By: Narda Rutherford M.D.   On: 02/27/2023 12:16    Procedures Procedures    Medications Ordered in ED Medications - No data to display  ED Course/ Medical Decision Making/ A&P                                 Medical Decision Making Amount and/or Complexity of Data Reviewed Labs: ordered. Radiology: ordered.   Pt with multiple medical problems and comorbidities and presenting today with a complaint that caries a high risk for morbidity and mortality.  Here today with complaints of chest pain as well as dyspnea on exertion.  Based on the patient's report the dyspnea on exertion has been an ongoing issue but seems worse in the last few days.  The chest pain is new.  Patient has been seen by cardiologist and has known aortic root dilation with some minimal calcifications on her valves.  She denies any infectious symptoms at this time.  No prior history of PE and no risk factors concerning for DVT at this time.  Low risk Wells criteria.  However concern for possible dissection, ACS however lower suspicion as patient does have normal coronary arteries, worsening pulmonary hypertension.  Also concern for possible  musculoskeletal component.  No GI symptoms at this time.  I independently interpreted patient's EKG and labs.  EKG today with no significant change from prior.  CBC within normal limits, CMP, troponin and D-dimer are all within normal limits.  I have independently visualized and interpreted pt's images today.  Chest x-ray within normal limits today.  Given patient's known aortic root dilation and new chest pain we will do a CT to ensure no evidence of dissection today.          Final Clinical Impression(s) / ED Diagnoses Final diagnoses:  None    Rx / DC Orders ED Discharge Orders     None         Gwyneth Sprout, MD 03/05/23 [id number]

## 2023-02-27 NOTE — ED Triage Notes (Signed)
Pt reports for the last several days has had heart racing sensation, left side CP, left shoulder pain and generalized weakness. Pt reports SOB with exertion. Pt appears stable. No fevers, cough. Some nausea. Pt with outpt echo scheduled by cardiology to assess heart function but has not gone because of expense.

## 2023-02-27 NOTE — ED Provider Notes (Signed)
Patient signed out to me by previous provider. Please refer to their note for full HPI.  Briefly this is a 62 year old female presented to the emergency department left-sided chest pain.  History of bicuspid aortic valve with aortic root dilation.  Workup thus far has been reassuring, cardiac workup is negative.  She is pending CT study to rule out dissection.  CT study shows no dissection or abnormality.  On reevaluation patient feels well.  She has scheduled outpatient follow-up with cardiology/echo next week.  Offers no new concerns or complaints.  Patient at this time appears safe and stable for discharge and close outpatient follow up. Discharge plan and strict return to ED precautions discussed, patient verbalizes understanding and agreement.   Rozelle Logan, DO 02/27/23 1909

## 2023-02-27 NOTE — Discharge Instructions (Addendum)
You have been seen and discharged from the emergency department.  Your cardiac and lung workup were normal.  The CT of your chest showed no acute finding.  Follow-up at your scheduled echo and outpatient cardiology appointments.  Follow-up with your primary provider for further evaluation and further care. Take home medications as prescribed. If you have any worsening symptoms or further concerns for your health please return to an emergency department for further evaluation.

## 2023-03-03 ENCOUNTER — Encounter: Payer: Self-pay | Admitting: Cardiology

## 2023-03-03 ENCOUNTER — Telehealth: Payer: Self-pay | Admitting: Cardiology

## 2023-03-03 NOTE — Telephone Encounter (Signed)
Patient would like to switch from Dr. Bing Matter to Dr. Cristal Deer. Please confirm transfer

## 2023-03-11 ENCOUNTER — Encounter: Payer: Self-pay | Admitting: Cardiology

## 2023-03-12 ENCOUNTER — Ambulatory Visit: Payer: 59 | Attending: Cardiology | Admitting: Cardiology

## 2023-03-12 ENCOUNTER — Encounter: Payer: Self-pay | Admitting: Cardiology

## 2023-03-12 VITALS — BP 136/82 | HR 76 | Ht 62.0 in | Wt 175.0 lb

## 2023-03-12 DIAGNOSIS — R0789 Other chest pain: Secondary | ICD-10-CM

## 2023-03-12 DIAGNOSIS — I10 Essential (primary) hypertension: Secondary | ICD-10-CM

## 2023-03-12 DIAGNOSIS — Q2381 Bicuspid aortic valve: Secondary | ICD-10-CM | POA: Diagnosis not present

## 2023-03-12 DIAGNOSIS — I77810 Thoracic aortic ectasia: Secondary | ICD-10-CM

## 2023-03-12 NOTE — Patient Instructions (Signed)
Medication Instructions:  ?Your physician recommends that you continue on your current medications as directed. Please refer to the Current Medication list given to you today.  ?*If you need a refill on your cardiac medications before your next appointment, please call your pharmacy* ? ? ?Lab Work: ?None Ordered ?If you have labs (blood work) drawn today and your tests are completely normal, you will receive your results only by: ?MyChart Message (if you have MyChart) OR ?A paper copy in the mail ?If you have any lab test that is abnormal or we need to change your treatment, we will call you to review the results. ? ? ?Testing/Procedures: ?None Ordered ? ? ?Follow-Up: ?At [facility], you and your health needs are our priority.  As part of our continuing mission to provide you with exceptional heart care, we have created designated Provider Care Teams.  These Care Teams include your primary Cardiologist (physician) and Advanced Practice Providers (APPs -  Physician Assistants and Nurse Practitioners) who all work together to provide you with the care you need, when you need it. ? ?We recommend signing up for the patient portal called "MyChart".  Sign up information is provided on this After Visit Summary.  MyChart is used to connect with patients for Virtual Visits (Telemedicine).  Patients are able to view lab/test results, encounter notes, upcoming appointments, etc.  Non-urgent messages can be sent to your provider as well.   ?To learn more about what you can do with MyChart, go to [email or web address].   ? ?Your next appointment:   ?1 month(s) ? ?The format for your next appointment:   ?In Person ? ?Provider:   ?Robert Krasowski, MD  ? ? ?Other Instructions ?NA  ?

## 2023-03-12 NOTE — Progress Notes (Signed)
Cardiology Office Note:    Date:  03/12/2023   ID:  [name], DOB 1960/05/31, MRN [id number]  PCP:  Farris Has, MD  Cardiologist:  Gypsy Balsam, MD    Referring MD: Farris Has, MD   Chief Complaint  Patient presents with   Follow-up    History of Present Illness:    Kristin [name] is a 62 y.o. female medical history significant for bicuspid arctic valve, mildly enlarged aortic root with some question about ASD however cardiac catheterization did not show any evidence of shunt comes today to my office after visit to the emergency room.  She went to sleep woke up in the middle of night with pain in the left shoulder there was also some numbness of the shoulder when she lay down and the shoulder was hurting she also have some palpitation.  She ended going to the emergency room in the emergency room troponin was done which was normal D-dimer was done which was normal she got CT angio of chest and abdomen did not show any dissection.  She was discharged home.  Comes today to my office doing much better does not have any exertional symptoms symptoms she have to take a deep breath but no chest pain tightness squeezing pressure burning chest.  Past Medical History:  Diagnosis Date   Abdominal pain 01/28/2021   Allergy    Alopecia 01/28/2021   Aortic root dilatation (HCC) 06/03/2021   ASD (atrial septal defect) 03/16/2020   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   Atypical chest pain 03/20/2021   Bicuspid aortic valve 03/16/2020   Dyspnea    Dyspnea on exertion 02/23/2012   Followed in Pulmonary clinic/ [facility]/ [place]    - 02/23/2012  Walked RA x 3 laps @ 185 ft each stopped due to  End of study, no tachypnea, tachycardia or desat     Elevated blood-pressure reading, without diagnosis of hypertension 01/28/2021   Endometriosis    High cholesterol 10/16/2020   History of basal cell carcinoma (BCC) 01/16/2023   Hyperlipidemia     Hypertension    IBS (irritable bowel syndrome)    Increased frequency of urination 07/24/2014   Intramural uterine fibroid 01/29/2019   Lichen planus 01/29/2021   Loss of hair    Pain, abdominal    Right lower quadrant pain 04/16/2021   Seasonal allergic rhinitis 10/16/2020   Skin cancer, basal cell    Stress 01/28/2021   Vitamin D deficiency     Past Surgical History:  Procedure Laterality Date   CESAREAN SECTION  1983   CHOLECYSTECTOMY  1990   In [place]   CYSTOURETHROSCOPY  2016   HYSTEROSCOPY  2015   LAPAROSCOPY  03/12/2016   with resection of pelvic adhesions   LAPAROTOMY  1990   post operative infection from cholecystectomy   OOPHORECTOMY Left 2000   RIGHT/LEFT HEART CATH AND CORONARY ANGIOGRAPHY N/A 04/23/2021   Procedure: RIGHT/LEFT HEART CATH AND CORONARY ANGIOGRAPHY;  Surgeon: Lyn Records, MD;  [facility]: [facility];  Service: Cardiovascular;  Laterality: N/A;   SKIN CANCER EXCISION     facial    Current Medications: Current Meds  Medication Sig   aspirin EC 81 MG tablet Take 1 tablet (81 mg total) by mouth daily. Swallow whole.   BIOTIN PO Take 1 tablet by mouth daily in the afternoon.   Boswellia-Glucosamine-Vit D (OSTEO BI-FLEX ONE PER DAY PO) Take 1 tablet by mouth daily in the afternoon.  CALCIUM PO Take 1 tablet by mouth daily in the afternoon.   cetirizine (ZYRTEC) 10 MG tablet Take 10 mg by mouth daily as needed for allergies.   Cholecalciferol (VITAMIN D-3 PO) Take 1 capsule by mouth daily in the afternoon.   clobetasol (TEMOVATE) 0.05 % external solution Apply 1 application  topically 2 (two) times daily as needed (scalp irritation).   Coenzyme Q10 (COQ10 PO) Take 1 capsule by mouth daily in the afternoon.   estradiol (ESTRACE) 0.5 MG tablet Take 0.5 mg by mouth daily in the afternoon.   EVENING PRIMROSE OIL PO Take 1 capsule by mouth daily in the afternoon.   finasteride (PROSCAR) 5 MG tablet Take 5 mg by mouth every evening.   hyoscyamine  (LEVBID) 0.375 MG 12 hr tablet Take 0.375 mg by mouth at bedtime.   ibuprofen (ADVIL,MOTRIN) 200 MG tablet Take 200-400 mg by mouth 2 (two) times daily as needed for mild pain, moderate pain or headache.   metoprolol tartrate (LOPRESSOR) 25 MG tablet Take 1 tablet (25 mg total) by mouth 2 (two) times daily.   Multiple Vitamin (MULTIVITAMIN WITH MINERALS) TABS tablet Take 1 tablet by mouth daily in the afternoon. Unknown strength   progesterone (PROMETRIUM) 200 MG capsule Take 1 capsule (200 mg total) by mouth daily. (Patient taking differently: Take 200 mg by mouth at bedtime.)   rosuvastatin (CRESTOR) 5 MG tablet Take 1 tablet (5 mg total) by mouth daily.   VITAMIN E PO Take 1 capsule by mouth daily in the afternoon.   Current Facility-Administered Medications for the 03/12/23 encounter (Office Visit) with Georgeanna Lea, MD  Medication   0.9 %  sodium chloride infusion     Allergies:   Sanctura [trospium], Zithromax [azithromycin], Ditropan [oxybutynin], Nickel, Other, Sporanox [itraconazole], and Voltaren [diclofenac]   Social History   Socioeconomic History   Marital status: Married    Spouse name: Not on file   Number of children: 1   Years of education: Not on file   Highest education level: Not on file  Occupational History   Not on file  Tobacco Use   Smoking status: Former    Current packs/day: 0.00    Average packs/day: 1 pack/day for 10.0 years (10.0 ttl pk-yrs)    Types: Cigarettes    Start date: 05/19/1994    Quit date: 05/19/2004    Years since quitting: 18.8   Smokeless tobacco: Never  Vaping Use   Vaping status: Never Used  Substance and Sexual Activity   Alcohol use: No   Drug use: No   Sexual activity: Not Currently    Birth control/protection: Post-menopausal  Other Topics Concern   Not on file  Social History Narrative   Not on file   Social Determinants of Health   Financial Resource Strain: Not on file  Food Insecurity: Not on file   Transportation Needs: Not on file  Physical Activity: Not on file  Stress: Not on file  Social Connections: Not on file     Family History: The patient's family history includes Diabetes in her mother; Heart disease in her father; Kidney cancer in her mother; Liver disease in her father. ROS:   Please see the history of present illness.    All 14 point review of systems negative except as described per history of present illness  EKGs/Labs/Other Studies Reviewed:         Recent Labs: 02/27/2023: ALT 27; BUN 19; Creatinine, Ser 0.66; Hemoglobin 13.1; Platelets 266; Potassium 4.1; Sodium  139  Recent Lipid Panel    Component Value Date/Time   CHOL 177 05/16/2019 0754   TRIG 133 05/16/2019 0754   HDL 77 05/16/2019 0754   CHOLHDL 2.3 05/16/2019 0754   LDLCALC 77 05/16/2019 0754    Physical Exam:    VS:  BP (!) 180/90 (BP Location: Left Arm, Patient Position: Sitting)   Pulse 76   Ht 5\' 2"  (1.575 m)   Wt 175 lb (79.4 kg)   LMP 12/28/2010   SpO2 99%   BMI 32.01 kg/m     Wt Readings from Last 3 Encounters:  03/12/23 175 lb (79.4 kg)  02/27/23 174 lb (78.9 kg)  01/20/23 175 lb (79.4 kg)     GEN:  Well nourished, well developed in no acute distress HEENT: Normal NECK: No JVD; No carotid bruits LYMPHATICS: No lymphadenopathy CARDIAC: RRR, systolic ejection murmur grade 1/6 S2 is still present. , no rubs, no gallops RESPIRATORY:  Clear to auscultation without rales, wheezing or rhonchi  ABDOMEN: Soft, non-tender, non-distended MUSCULOSKELETAL:  No edema; No deformity  SKIN: Warm and dry LOWER EXTREMITIES: no swelling NEUROLOGIC:  Alert and oriented x 3 PSYCHIATRIC:  Normal affect   ASSESSMENT:    1. Aortic root dilatation (HCC)   2. Bicuspid aortic valve   3. Primary hypertension   4. Atypical chest pain    PLAN:    In order of problems listed above:  Aortic root dilatation: CT review stable. Bicuspid aortic valve she is scheduled to have echocardiogram  in the next few days will wait for results of the test. Essential hypertension she said it is always elevated in the office when checked it at home it is always good.  Will continue monitoring. Atypical chest pain I do not find any cardiac explanation for his symptomatology will continue monitoring.   Medication Adjustments/Labs and Tests Ordered: Current medicines are reviewed at length with the patient today.  Concerns regarding medicines are outlined above.  No orders of the defined types were placed in this encounter.  Medication changes: No orders of the defined types were placed in this encounter.   Signed, Georgeanna Lea, MD, [facility] 03/12/2023 3:12 PM    [facility]

## 2023-03-16 ENCOUNTER — Ambulatory Visit
Admission: RE | Admit: 2023-03-16 | Discharge: 2023-03-16 | Disposition: A | Discharge status: Home / Self Care | Payer: 59 | Source: Ambulatory Visit | Attending: Obstetrics & Gynecology | Admitting: Obstetrics & Gynecology

## 2023-03-16 DIAGNOSIS — Z1231 Encounter for screening mammogram for malignant neoplasm of breast: Secondary | ICD-10-CM

## 2023-03-20 ENCOUNTER — Ambulatory Visit (HOSPITAL_COMMUNITY): Payer: 59 | Attending: Cardiology

## 2023-03-20 DIAGNOSIS — R0609 Other forms of dyspnea: Secondary | ICD-10-CM

## 2023-03-20 LAB — ECHOCARDIOGRAM COMPLETE
AR max vel: 1.73 cm2
AV Area VTI: 1.63 cm2
AV Area mean vel: 1.56 cm2
AV Mean grad: 8 mm[Hg]
AV Peak grad: 14.1 mm[Hg]
Ao pk vel: 1.88 m/s
Area-P 1/2: 1.88 cm2
S' Lateral: 3.1 cm

## 2023-03-24 ENCOUNTER — Telehealth: Payer: Self-pay

## 2023-03-24 NOTE — Telephone Encounter (Signed)
Left message on My Chart with normal results per Dr. Krasowski's note. Routed to PCP. 

## 2023-03-25 ENCOUNTER — Telehealth: Payer: Self-pay

## 2023-03-25 NOTE — Telephone Encounter (Signed)
Pt viewed results on My Chart per Dr. Krasowski's note. Routed to PCP.  

## 2023-04-23 ENCOUNTER — Encounter: Payer: Self-pay | Admitting: Cardiology

## 2023-04-23 ENCOUNTER — Ambulatory Visit: Payer: 59 | Attending: Cardiology | Admitting: Cardiology

## 2023-04-23 VITALS — BP 147/90 | HR 81 | Ht 62.0 in | Wt 174.0 lb

## 2023-04-23 DIAGNOSIS — Q2381 Bicuspid aortic valve: Secondary | ICD-10-CM

## 2023-04-23 DIAGNOSIS — I10 Essential (primary) hypertension: Secondary | ICD-10-CM | POA: Diagnosis not present

## 2023-04-23 DIAGNOSIS — I77810 Thoracic aortic ectasia: Secondary | ICD-10-CM

## 2023-04-23 NOTE — Patient Instructions (Signed)

## 2023-04-23 NOTE — Progress Notes (Signed)
Cardiology Office Note:    Date:  04/23/2023   ID:  Canyon Yando, DOB 17-Feb-1961, MRN [id number]  PCP:  Farris Has, MD  Cardiologist:  Gypsy Balsam, MD    Referring MD: Farris Has, MD   Chief Complaint  Patient presents with   Follow-up    History of Present Illness:    Kristin [name] is a 62 y.o. female past medical history significant for questionable bicuspid aortic valve, mild enlargement of the aortic root measuring 40 mm, essential hypertension, there is some question about ASD but cardiac catheterization did not show any intracardiac communication.  Comes today to months for follow-up, last time I seen her she was having some issue with the left arm and up having appointment in the emergency room everything Is fine.  Since that time she is doing well.  She took some medication for her stomach that seems to be helping a lot  Past Medical History:  Diagnosis Date   Abdominal pain 01/28/2021   Allergy    Alopecia 01/28/2021   Aortic root dilatation (HCC) 06/03/2021   ASD (atrial septal defect) 03/16/2020   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   Atypical chest pain 03/20/2021   Bicuspid aortic valve 03/16/2020   Dyspnea    Dyspnea on exertion 02/23/2012   Followed in Pulmonary clinic/ [facility]/ [place]    - 02/23/2012  Walked RA x 3 laps @ 185 ft each stopped due to  End of study, no tachypnea, tachycardia or desat     Elevated blood-pressure reading, without diagnosis of hypertension 01/28/2021   Endometriosis    High cholesterol 10/16/2020   History of basal cell carcinoma (BCC) 01/16/2023   Hyperlipidemia    Hypertension    IBS (irritable bowel syndrome)    Increased frequency of urination 07/24/2014   Intramural uterine fibroid 01/29/2019   Lichen planus 01/29/2021   Loss of hair    Pain, abdominal    Right lower quadrant pain 04/16/2021   Seasonal allergic rhinitis 10/16/2020   Skin cancer, basal cell    Stress  01/28/2021   Vitamin D deficiency     Past Surgical History:  Procedure Laterality Date   CESAREAN SECTION  1983   CHOLECYSTECTOMY  1990   In [place]   CYSTOURETHROSCOPY  2016   HYSTEROSCOPY  2015   LAPAROSCOPY  03/12/2016   with resection of pelvic adhesions   LAPAROTOMY  1990   post operative infection from cholecystectomy   OOPHORECTOMY Left 2000   RIGHT/LEFT HEART CATH AND CORONARY ANGIOGRAPHY N/A 04/23/2021   Procedure: RIGHT/LEFT HEART CATH AND CORONARY ANGIOGRAPHY;  Surgeon: Lyn Records, MD;  Location: [facility];  Service: Cardiovascular;  Laterality: N/A;   SKIN CANCER EXCISION     facial    Current Medications: Current Meds  Medication Sig   aspirin EC 81 MG tablet Take 1 tablet (81 mg total) by mouth daily. Swallow whole.   BIOTIN PO Take 1 tablet by mouth daily in the afternoon.   Boswellia-Glucosamine-Vit D (OSTEO BI-FLEX ONE PER DAY PO) Take 1 tablet by mouth daily in the afternoon.   CALCIUM PO Take 1 tablet by mouth daily in the afternoon.   cetirizine (ZYRTEC) 10 MG tablet Take 10 mg by mouth daily as needed for allergies.   Cholecalciferol (VITAMIN D-3 PO) Take 1 capsule by mouth daily in the afternoon.   clobetasol (TEMOVATE) 0.05 % external solution Apply 1 application  topically 2 (two) times daily  as needed (scalp irritation).   Coenzyme Q10 (COQ10 PO) Take 1 capsule by mouth daily in the afternoon.   estradiol (ESTRACE) 0.5 MG tablet Take 0.5 mg by mouth daily in the afternoon.   EVENING PRIMROSE OIL PO Take 1 capsule by mouth daily in the afternoon.   finasteride (PROSCAR) 5 MG tablet Take 5 mg by mouth every evening.   hyoscyamine (LEVBID) 0.375 MG 12 hr tablet Take 0.375 mg by mouth at bedtime.   ibuprofen (ADVIL,MOTRIN) 200 MG tablet Take 200-400 mg by mouth 2 (two) times daily as needed for mild pain, moderate pain or headache.   metoprolol tartrate (LOPRESSOR) 25 MG tablet Take 1 tablet (25 mg total) by mouth 2 (two) times daily.    Multiple Vitamin (MULTIVITAMIN WITH MINERALS) TABS tablet Take 1 tablet by mouth daily in the afternoon. Unknown strength   progesterone (PROMETRIUM) 200 MG capsule Take 1 capsule (200 mg total) by mouth daily. (Patient taking differently: Take 200 mg by mouth at bedtime.)   rosuvastatin (CRESTOR) 5 MG tablet Take 1 tablet (5 mg total) by mouth daily.   VITAMIN E PO Take 1 capsule by mouth daily in the afternoon.   Current Facility-Administered Medications for the 04/23/23 encounter (Office Visit) with Georgeanna Lea, MD  Medication   0.9 %  sodium chloride infusion     Allergies:   Sanctura [trospium], Zithromax [azithromycin], Ditropan [oxybutynin], Nickel, Other, Sporanox [itraconazole], and Voltaren [diclofenac]   Social History   Socioeconomic History   Marital status: Married    Spouse name: Not on file   Number of children: 1   Years of education: Not on file   Highest education level: Not on file  Occupational History   Not on file  Tobacco Use   Smoking status: Former    Current packs/day: 0.00    Average packs/day: 1 pack/day for 10.0 years (10.0 ttl pk-yrs)    Types: Cigarettes    Start date: 05/19/1994    Quit date: 05/19/2004    Years since quitting: 18.9   Smokeless tobacco: Never  Vaping Use   Vaping status: Never Used  Substance and Sexual Activity   Alcohol use: No   Drug use: No   Sexual activity: Not Currently    Birth control/protection: Post-menopausal  Other Topics Concern   Not on file  Social History Narrative   Not on file   Social Determinants of Health   Financial Resource Strain: Not on file  Food Insecurity: Not on file  Transportation Needs: Not on file  Physical Activity: Not on file  Stress: Not on file  Social Connections: Not on file     Family History: The patient's family history includes Diabetes in her mother; Heart disease in her father; Kidney cancer in her mother; Liver disease in her father. ROS:   Please see the  history of present illness.    All 14 point review of systems negative except as described per history of present illness  EKGs/Labs/Other Studies Reviewed:         Recent Labs: 02/27/2023: ALT 27; BUN 19; Creatinine, Ser 0.66; Hemoglobin 13.1; Platelets 266; Potassium 4.1; Sodium 139  Recent Lipid Panel    Component Value Date/Time   CHOL 177 05/16/2019 0754   TRIG 133 05/16/2019 0754   HDL 77 05/16/2019 0754   CHOLHDL 2.3 05/16/2019 0754   LDLCALC 77 05/16/2019 0754    Physical Exam:    VS:  BP (!) 147/90 (BP Location: Left Arm, Patient Position:  Sitting, Cuff Size: Normal)   Pulse 81   Ht 5\' 2"  (1.575 m)   Wt 174 lb (78.9 kg)   LMP 12/28/2010   SpO2 97%   BMI 31.83 kg/m     Wt Readings from Last 3 Encounters:  04/23/23 174 lb (78.9 kg)  03/12/23 175 lb (79.4 kg)  02/27/23 174 lb (78.9 kg)     GEN:  Well nourished, well developed in no acute distress HEENT: Normal NECK: No JVD; No carotid bruits LYMPHATICS: No lymphadenopathy CARDIAC: RRR, no murmurs, no rubs, no gallops RESPIRATORY:  Clear to auscultation without rales, wheezing or rhonchi  ABDOMEN: Soft, non-tender, non-distended MUSCULOSKELETAL:  No edema; No deformity  SKIN: Warm and dry LOWER EXTREMITIES: no swelling NEUROLOGIC:  Alert and oriented x 3 PSYCHIATRIC:  Normal affect   ASSESSMENT:    1. Aortic root dilatation (HCC)   2. Bicuspid aortic valve   3. Primary hypertension    PLAN:    In order of problems listed above:  Bicuspid aortic valve echocardiogram read done I did review it only mild aortic stenosis questionable aortic bicuspid valve.  Will continue monitoring. Arctic root enlargement of 40 mm.  Continue monitoring no need to intervene. Essential hypertension elevated today but she said she always check it at home always good we will continue monitoring. Dyslipidemia I did review K PN LDL 83 HDL 63 continue present management   Medication Adjustments/Labs and Tests  Ordered: Current medicines are reviewed at length with the patient today.  Concerns regarding medicines are outlined above.  No orders of the defined types were placed in this encounter.  Medication changes: No orders of the defined types were placed in this encounter.   Signed, Georgeanna Lea, MD, [facility] 04/23/2023 2:19 PM    [facility]

## 2023-06-02 ENCOUNTER — Encounter: Payer: Self-pay | Admitting: Internal Medicine

## 2023-06-02 ENCOUNTER — Ambulatory Visit: Payer: 59 | Admitting: Internal Medicine

## 2023-06-02 VITALS — BP 159/98 | HR 79 | Ht 62.0 in | Wt 179.6 lb

## 2023-06-02 DIAGNOSIS — Z87891 Personal history of nicotine dependence: Secondary | ICD-10-CM | POA: Diagnosis not present

## 2023-06-02 DIAGNOSIS — R0609 Other forms of dyspnea: Secondary | ICD-10-CM

## 2023-06-02 NOTE — Patient Instructions (Addendum)
 ICD-10-CM   1. DOE (dyspnea on exertion)  R06.09       Shortness of breath very likely related to slightly stiff heart muscle [called grade 1 diastolic dysfunction] and slightly low ejection fraction [55%] and also weight gain.  However it is possible that your allergies are playing a role in this cannot be ruled out  There are old scar tissue abnormalities on his CT scan of the chest there is stable and several CT scans between 20 17 through 20 24.  I suspect this is from previous pneumonia or infection.  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  This is very mild and this is not causing her shortness of breath.  I also do not expect this to get worse  Plan -Recommend trying Trelegy inhaler sample 1 puff once daily for 1 month -> and then going off this for 1 month and then seeing if there is any change in your symptoms -Do full pulmonary function test in 2 months - consider CT scna for lung cancer screening in Nov 2025  Follow - 15-minute visit Dr. Geronimo to review progress but after PFT  - if non contriubitor consider CPST bike test

## 2023-06-02 NOTE — Progress Notes (Addendum)
 OV 06/02/2023  Subjective:  Patient ID: Kristin [name], female , DOB: 25-Apr-1961 , age 63 y.o. , MRN: [id number] , ADDRESS: [id number] [place] [id number] PCP Kip Righter, MD Patient Care Team: Kip Righter, MD as PCP - General (Family Medicine)  This Provider for this visit: Treatment Team:  Attending Provider: Geronimo Amel, MD    06/02/2023 -   Chief Complaint  Patient presents with   Consult    Pt states she has been experiencing sob, does have allergies and has not received treatment in 5 years. Started in 2018, does have a cardiologist.No inhaler usage     HPI Kristin [name] 63 y.o. -this is a new consult for shortness of breath..  She is originally from [place].  [place] [address] [place].  She is a housewife.  She says that in 2015 she started seeing Dr. Brian Grounds monte showed the records].  There is a diagnosis of seasonal allergies and mild intermittent asthma.  Skin test appears to have been positive for cotton-wool American birch grass Johnson grass and [place] grass.  She states she was getting allergy  shots and during this time was actually quite feeling well.  But in 2021 ran out of health insurance and then stopped taking allergy  shots.  During this time in 2018 she started noticing insidious onset of shortness of breath relieved by rest.  She had up with a cardiac evaluation and diagnosed to have ASD but without any coronary artery obstructions.  She had a clean cardiac cath according to chart review.  On the recent echo 03/20/2023 her EF is 55% and she has grade 1 diastolic dysfunction [4 years earlier she had EF 55-60% but currently 50-55%].  She tells me that gradually she is gained 12 pounds of weight in the last 5 years during this time shortness of breath with exertion is also worse.  Is particular worse with stairs and also putting on her shoes when she bends over.  She is gaining weight.  Her BMI is  high.  She does have some chest heaviness that is related to acid reflux she is upcoming endoscopy/GI appointment.  There is barely any cough and there is no wheezing.  Of note she is a former smoker having smoked 1 pack of cigarettes a day starting at age 13 and quitting in 25 at age 5.  Therefore total 22 pack smoking history.  And quit 20 years ago in 2005.  She has had several CT scans some of the chest but mostly abdomen.  The most recent CT chest heart was the end of 2020 for that I personally visualized detailed below.  She does have some postinflammatory atelectasis particularly in the left lower lobe and also some of the right lower lobe.  These are stable in my view  Narrative & Impression  CLINICAL DATA:  Aortic aneurysm suspected hx of dilated aortic root and now with new chest pain r/o dissection or aneurysm   EXAM: CT ANGIOGRAPHY CHEST, ABDOMEN AND PELVIS   TECHNIQUE: Non-contrast CT of the chest was initially obtained.   Multidetector CT imaging through the chest, abdomen and pelvis was performed using the standard protocol during bolus administration of intravenous contrast. Multiplanar reconstructed images and MIPs were obtained and reviewed to evaluate the vascular anatomy.   RADIATION DOSE REDUCTION: This exam was performed according to the departmental dose-optimization program which includes automated exposure control, adjustment of the mA and/or  kV according to patient size and/or use of iterative reconstruction technique.   CONTRAST:  OMNIPAQUE  IOHEXOL  350 MG/ML SOLN   COMPARISON:  CT scan abdomen and pelvis from 01/23/2018.   FINDINGS: CTA CHEST FINDINGS   Cardiovascular: No intramural hematoma noted in the thoracic aorta on the unenhanced images.   Thoracic aorta is normal in caliber without aneurysm, dissection, vasculitis or significant stenosis. Even though the ascending aortic measurements are amenable to cardiac pulsation artifact as  this examination was performed without cardiac gating; however, ascending aorta measured up to 3.9 cm in diameter. Aortic root measurements are not obtained due to extensive motion.   Normal cardiac size. No pericardial effusion.   Mediastinum/Nodes: Visualized thyroid  gland appears grossly unremarkable. No solid / cystic mediastinal masses. The esophagus is nondistended precluding optimal assessment. No axillary, mediastinal or hilar lymphadenopathy by size criteria.   Lungs/Pleura: The central tracheo-bronchial tree is patent. There are patchy areas of linear, plate-like atelectasis and/or scarring throughout bilateral lungs. No mass or consolidation. No pleural effusion or pneumothorax. No suspicious lung nodules.   Musculoskeletal: The visualized soft tissues of the chest wall are grossly unremarkable. No suspicious osseous lesions. There are mild multilevel degenerative changes in the visualized spine.   Review of the MIP images confirms the above findings.   CTA ABDOMEN AND PELVIS FINDINGS   VASCULAR   Aorta: Normal caliber aorta without aneurysm, dissection, vasculitis or significant stenosis.   Celiac: Patent without evidence of aneurysm, dissection, vasculitis or significant stenosis.   SMA: Patent without evidence of aneurysm, dissection, vasculitis or significant stenosis.   Renals: Both renal arteries are patent without evidence of aneurysm, dissection, vasculitis, fibromuscular dysplasia or significant stenosis.   IMA: Patent without evidence of aneurysm, dissection, vasculitis or significant stenosis.   Inflow: Patent without evidence of aneurysm, dissection, vasculitis or significant stenosis.   Veins: No obvious venous abnormality within the limitations of this arterial phase study.   Review of the MIP images confirms the above findings.   NON-VASCULAR   Hepatobiliary: The liver is normal in size. Non-cirrhotic configuration. No suspicious mass.  No intrahepatic bile duct dilation. There is mild prominence of the extrahepatic bile duct, most likely due to post cholecystectomy status. Gallbladder is surgically absent.   Pancreas: Unremarkable. No pancreatic ductal dilatation or surrounding inflammatory changes.   Spleen: Within normal limits. No focal lesion.   Adrenals/Urinary Tract: Adrenal glands are unremarkable. No suspicious renal mass. Multiple sinus cysts noted in bilateral kidneys. No hydronephrosis. No renal or ureteric calculi. Unremarkable urinary bladder.   Stomach/Bowel: No disproportionate dilation of the small or large bowel loops. No evidence of abnormal bowel wall thickening or inflammatory changes. The appendix is unremarkable. There are multiple diverticula , without imaging signs of diverticulitis.   Vascular/Lymphatic: No ascites or pneumoperitoneum. No abdominal or pelvic lymphadenopathy, by size criteria. No aneurysmal dilation of the major abdominal arteries.   Reproductive: The uterus is unremarkable. No large adnexal mass.   Other: There is a tiny fat containing umbilical hernia. The soft tissues and abdominal wall are otherwise unremarkable.   Musculoskeletal: No suspicious osseous lesions. There are mild multilevel degenerative changes in the visualized spine.   Review of the MIP images confirms the above findings.   IMPRESSION: 1. No acute findings in the chest, abdomen, or pelvis. Specifically, no evidence of aortic dissection or aneurysm. 2. Colonic diverticulosis without imaging signs of diverticulitis. 3. Multiple other nonacute observations, as described above.     Electronically Signed   By:  Ree Molt M.D.   On: 02/27/2023 17:04       Simple office walk 224 (66+46 x 2) feet Pod A at [facility] x  3 laps goal with forehead probe 06/02/2023    O2 used ra   Number laps completed Sit stand x 15   Comments about pace x   Resting Pulse Ox/HR 95% and 80/min   Final Pulse  Ox/HR 95% and 86/min   Desaturated </= 88% x   Desaturated <= 3% points x   Got Tachycardic >/= 90/min xxx   Symptoms at end of test x   Miscellaneous comments x     PFT      No data to display          Latest Reference Range & Units 01/23/20 08:40 02/27/23 10:57  Eosinophils Absolute 0.0 - 0.5 K/uL 0.0 0.1     LAB RESULTS last 96 hours No results found.  LAB RESULTS last 90 days Recent Results (from the past 2160 hours)  ECHOCARDIOGRAM COMPLETE     Status: None   Collection Time: 03/20/23  8:31 AM  Result Value Ref Range   Area-P 1/2 1.88 cm2   S' Lateral 3.10 cm   AV Area mean vel 1.56 cm2   AR max vel 1.73 cm2   AV Area VTI 1.63 cm2   Ao pk vel 1.88 m/s   AV Mean grad 8.0 mmHg   AV Peak grad 14.1 mmHg   Est EF 50 - 55%          has a past medical history of Abdominal pain (01/28/2021), Allergy , Alopecia (01/28/2021), Aortic root dilatation (HCC) (06/03/2021), ASD (atrial septal defect) (03/16/2020), Atrial septal defect, Atypical chest pain (03/20/2021), Bicuspid aortic valve (03/16/2020), Dyspnea, Dyspnea on exertion (02/23/2012), Elevated blood-pressure reading, without diagnosis of hypertension (01/28/2021), Endometriosis, High cholesterol (10/16/2020), History of basal cell carcinoma (BCC) (01/16/2023), Hyperlipidemia, Hypertension, IBS (irritable bowel syndrome), Increased frequency of urination (07/24/2014), Intramural uterine fibroid (01/29/2019), Lichen planus (01/29/2021), Loss of hair, Pain, abdominal, Right lower quadrant pain (04/16/2021), Seasonal allergic rhinitis (10/16/2020), Skin cancer, basal cell, Stress (01/28/2021), and Vitamin D deficiency.   reports that she quit smoking about 19 years ago. Her smoking use included cigarettes. She started smoking about 29 years ago. She has a 10 pack-year smoking history. She has never used smokeless tobacco.  Past Surgical History:  Procedure Laterality Date   CESAREAN SECTION  1983   CHOLECYSTECTOMY  1990    In [place]   CYSTOURETHROSCOPY  2016   HYSTEROSCOPY  2015   LAPAROSCOPY  03/12/2016   with resection of pelvic adhesions   LAPAROTOMY  1990   post operative infection from cholecystectomy   OOPHORECTOMY Left 2000   RIGHT/LEFT HEART CATH AND CORONARY ANGIOGRAPHY N/A 04/23/2021   Procedure: RIGHT/LEFT HEART CATH AND CORONARY ANGIOGRAPHY;  Surgeon: Claudene Victory ORN, MD;  Location: [facility];  Service: Cardiovascular;  Laterality: N/A;   SKIN CANCER EXCISION     facial    Allergies  Allergen Reactions   Sanctura [Trospium] Shortness Of Breath, Itching and Rash   Zithromax [Azithromycin] Itching   Ditropan [Oxybutynin] Other (See Comments)    Vision changes   Nickel Other (See Comments)    Per allergy  test   Other Other (See Comments)    allergies to mold, cats, dogs, trees, and grass - causes nasal congestion   Sporanox [Itraconazole] Rash   Voltaren [Diclofenac] Rash    Immunization History  Administered Date(s) Administered  Influenza,inj,Quad PF,6+ Mos 02/03/2018, 01/02/2019   Influenza-Unspecified 02/04/2021, 02/20/2022, 01/18/2023   PFIZER(Purple Top)SARS-COV-2 Vaccination 07/30/2019, 08/30/2019   Tdap 04/20/2015   Zoster Recombinant(Shingrix) 01/02/2019, 03/04/2019    Family History  Problem Relation Age of Onset   Heart disease Father    Liver disease Father    Diabetes Mother    Kidney cancer Mother      Current Outpatient Medications:    aspirin  EC 81 MG tablet, Take 1 tablet (81 mg total) by mouth daily. Swallow whole., Disp: 90 tablet, Rfl: 3   BIOTIN PO, Take 1 tablet by mouth daily in the afternoon., Disp: , Rfl:    Boswellia-Glucosamine-Vit D (OSTEO BI-FLEX ONE PER DAY PO), Take 1 tablet by mouth daily in the afternoon., Disp: , Rfl:    CALCIUM  PO, Take 1 tablet by mouth daily in the afternoon., Disp: , Rfl:    cetirizine (ZYRTEC) 10 MG tablet, Take 10 mg by mouth daily as needed for allergies., Disp: , Rfl:    Cholecalciferol (VITAMIN D-3 PO),  Take 1 capsule by mouth daily in the afternoon., Disp: , Rfl:    clobetasol (TEMOVATE) 0.05 % external solution, Apply 1 application  topically 2 (two) times daily as needed (scalp irritation)., Disp: , Rfl: 0   Coenzyme Q10 (COQ10 PO), Take 1 capsule by mouth daily in the afternoon., Disp: , Rfl:    estradiol  (ESTRACE ) 0.5 MG tablet, Take 0.5 mg by mouth daily in the afternoon., Disp: , Rfl:    EVENING PRIMROSE OIL PO, Take 1 capsule by mouth daily in the afternoon., Disp: , Rfl:    finasteride (PROSCAR) 5 MG tablet, Take 5 mg by mouth every evening., Disp: , Rfl:    hyoscyamine  (LEVBID ) 0.375 MG 12 hr tablet, Take 0.375 mg by mouth at bedtime., Disp: , Rfl: 0   ibuprofen  (ADVIL ,MOTRIN ) 200 MG tablet, Take 200-400 mg by mouth 2 (two) times daily as needed for mild pain, moderate pain or headache., Disp: , Rfl:    metoprolol  tartrate (LOPRESSOR ) 25 MG tablet, Take 1 tablet (25 mg total) by mouth 2 (two) times daily., Disp: 180 tablet, Rfl: 2   Multiple Vitamin (MULTIVITAMIN WITH MINERALS) TABS tablet, Take 1 tablet by mouth daily in the afternoon. Unknown strength, Disp: , Rfl:    progesterone  (PROMETRIUM ) 200 MG capsule, Take 1 capsule (200 mg total) by mouth daily. (Patient taking differently: Take 200 mg by mouth at bedtime.), Disp: 90 capsule, Rfl: 3   rosuvastatin  (CRESTOR ) 5 MG tablet, Take 1 tablet (5 mg total) by mouth daily., Disp: 90 tablet, Rfl: 2   VITAMIN E PO, Take 1 capsule by mouth daily in the afternoon., Disp: , Rfl:   Current Facility-Administered Medications:    0.9 %  sodium chloride  infusion, 500 mL, Intravenous, Continuous, Aneita Gwendlyn DASEN, MD      Objective:   Vitals:   06/02/23 1322  BP: (!) 159/98  Pulse: 79  SpO2: 96%  Weight: 179 lb 9.6 oz (81.5 kg)  Height: 5' 2 (1.575 m)    Estimated body mass index is 32.85 kg/m as calculated from the following:   Height as of this encounter: 5' 2 (1.575 m).   Weight as of this encounter: 179 lb 9.6 oz (81.5  kg).  @WEIGHTCHANGE @  [employer]   06/02/23 1322  Weight: 179 lb 9.6 oz (81.5 kg)     Physical Exam   General: No distress. BMI 32 O2 at rest: no Cane present: no Sitting in wheel chair: no  Frail: no Obese: yes Neuro: Alert and Oriented x 3. GCS 15. Speech normal Psych: Pleasant Resp:  Barrel Chest - no.  Wheeze - no, Crackles - no, No overt respiratory distress CVS: Normal heart sounds. Murmurs - no Ext: Stigmata of Connective Tissue Disease - no HEENT: Normal upper airway. PEERL +. No post nasal drip        Assessment:       ICD-10-CM   1. DOE (dyspnea on exertion)  R06.09 Pulmonary function test    2. Quit smoking  Z87.891     3. Stopped smoking with greater than 20 pack year history  Z87.891          Plan:     Patient Instructions     ICD-10-CM   1. DOE (dyspnea on exertion)  R06.09       Shortness of breath very likely related to slightly stiff heart muscle [called grade 1 diastolic dysfunction] and slightly low ejection fraction [55%] and also weight gain.  However it is possible that your allergies are playing a role in this cannot be ruled out  There are old scar tissue abnormalities on his CT scan of the chest there is stable and several CT scans between 20 17 through 20 24.  I suspect this is from previous pneumonia or infection.  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  This is very mild and this is not causing her shortness of breath.  I also do not expect this to get worse  Plan -Recommend trying Trelegy inhaler sample 1 puff once daily for 1 month -> and then going off this for 1 month and then seeing if there is any change in your symptoms -Do full pulmonary function test in 2 months - consider CT scna for lung cancer screening in Nov 2025  Follow - 15-minute visit Dr. Geronimo to review progress but after PFT  - if non contriubitor consider CPST bike test   FOLLOWUP Return in about 8 weeks (around 07/28/2023) for 15 min  visit, with Dr Geronimo, Face to Face OR Video Visit.    SIGNATURE    Dr. Dorethia Geronimo, M.D., F.C.C.P,  Pulmonary and Critical Care Medicine Staff Physician, [facility] Director - Interstitial Lung Disease  Program  Pulmonary Fibrosis [facility] at [facility] [place], [place], [address]  Pager: [phone], If no answer or between  15:00h - 7:00h: call [phone] Telephone: [phone]  2:04 PM 06/02/2023

## 2023-06-08 ENCOUNTER — Ambulatory Visit (HOSPITAL_BASED_OUTPATIENT_CLINIC_OR_DEPARTMENT_OTHER): Payer: 59 | Admitting: Obstetrics & Gynecology

## 2023-06-10 ENCOUNTER — Ambulatory Visit (HOSPITAL_BASED_OUTPATIENT_CLINIC_OR_DEPARTMENT_OTHER): Payer: Self-pay | Admitting: Obstetrics & Gynecology

## 2023-06-25 ENCOUNTER — Other Ambulatory Visit (HOSPITAL_BASED_OUTPATIENT_CLINIC_OR_DEPARTMENT_OTHER): Payer: Self-pay | Admitting: Obstetrics & Gynecology

## 2023-06-25 NOTE — Telephone Encounter (Signed)
LMOVM for pt to call regarding refill request (needs appt)

## 2023-07-03 ENCOUNTER — Ambulatory Visit (HOSPITAL_BASED_OUTPATIENT_CLINIC_OR_DEPARTMENT_OTHER): Payer: Self-pay | Admitting: Internal Medicine

## 2023-07-03 NOTE — Telephone Encounter (Signed)
Office reporting pt been to UC couple times and now pneumonia, gave antibx still not better. Office reporting appt with Dr. Marchelle Gearing is not readily available.   Pulmonary "Chief Complaint (e.g., cough, sob, wheezing, fever, chills, sweat or additional symptoms) *Go to specific symptom protocol after initial questions. Lots of mucus from body, no fever, back pain, don't feel good, pain in chest, back to UC on Monday x-ray acute bronchitis  and pneumonia, prescribed antibx,  still have mucus and pain in back up, when cough up mucus  it's some hard pieces and some blood, most of time it's clear mucus Sometimes dark dark hard pieces brown and little blood, red drops of blood, tiny Mucus not blood clots "How long have symptoms been present?" Tested positive for covid 1/12, sick 1/17, all this time not feel well Have you tested for COVID or Flu? Note: If not, ask patient if a home test can be taken. If so, instruct patient to call back for positive results. Positive covid in mid January MEDICINES:   "Have you used any OTC meds to help with symptoms?" Yes If yes, ask "What medications?" Mucinex, now doing the antibx, not finished antibx, for 10 days, started Monday today is day 5 "Have you used your inhalers/maintenance medication?" Yes If yes, "What medications?" albuterol If inhaler, ask "How many puffs and how often?" Note: Review instructions on medication in the chart. 2 puffs 2x a day OXYGEN: "Do you wear supplemental oxygen?" No "Do you monitor your oxygen levels?" No             Chief Complaint: coughing up blood and hard mucus, on antibx for pneumonia/bronchitis Symptoms: chest pain not improving, SOB not improving, coughing up specks of red blood, productive cough, back pain, quiet crackling sound with breathing sometimes, SOB and chest pain more so with exertion Frequency: continual, intermittent Pertinent Negatives: Patient denies worsening chest pain, worsening SOB, coughing  up blood clots, difficulty breathing, difficulty walking, wheezing Disposition: [] 911 / [] ED /[] Urgent Care (no appt availability in office) / [x] Appointment(In office/virtual)/ []  [facility]/ [] Home Care/ [] Refused Recommended Disposition /[]  Mobile Bus/ [x]  Follow-up with PCP Additional Notes: Pt reporting she tested positive for covid 1/12, sick 1/17, sick "all this time, not feel well," went to UC 2x for illness including on Monday, x-ray found "acute bronchitis and pneumonia," prescribed levofloxacin and on day 5 today out of 10 days, also taking mucinex and using albuterol inhaler 2 pufs 2x/day. Pt reporting "lots of mucus from body, no fever, back pain, don't feel good, pain in chest," "mucus for long time since covid." Pt reporting that her new symptom since Monday since UC is "when cough up mucus it's some hard pieces and some blood, most of time it's clear mucus," reporting "sometimes dark dark hard pieces brown and little blood, red drops of blood, tiny," confirms no blood clots. Pt reporting that she started having upper back pain "maybe a week" ago, prior to UC visit, continuing, "something new" for her amongst her symptoms since January. Pt reporting she sees pulmonology for "chest pain I have often, so tired when go upstairs, have to catch my breath, have appt for more tests" coming up with pulm, but no known lung condition at this time. Pt reporting HTN and BP "been up and down" but max "150," BP readings 132/82 and 150/88 at [facility] visits. Pt reporting that her chest pain with this illness has been "not different from usual," "feel something heavy on chest, don't  know if sharp pain, not sharp," but "chest pain when exercise or moving, not often, been worse this weekend," pt confirms when feel pain in chest "little bit" and comes and goes. Pt reporting that her family member stated "why can't you breathe" with exertion, pt stating she is more SOB "when sick," but confirms  "breathing okay now I'm pretty good, just this mucus bothering me, some can't cough this out." Pt confirms no difficulty breathing at rest or difficulty walking. Pt reporting "hear sometimes noise of pops when breathing confirms very quiet crackling sound sometimes." Pt confirms not worsened in past couple days with chest pain or SOB, "antibiotic helps," confirms just not improving all the way yet and the hard mucus and "blood scares" her. Pt speaking in clear full sentences, laughing and in seemingly good spirits on phone, voicing appreciation. Advised pt call her PCP (not [facility]) for appt today since no ready availability with Dr. Marchelle Gearing, go to ED or call back if any worsening, anything out of her ordinary. Pt verbalized understanding.  Reason for Disposition  [1] Taking antibiotic > 48 hours (2 days) for pneumonia AND [2] breathing not improved  Answer Assessment - Initial Assessment Questions 1. SYMPTOM: "What's the main symptom you're concerned about?" (e.g., breathing difficulty, fever, weakness)     Lots of mucus from body, coughing up some hard pieces and some blood 2. ONSET: "When did the  symptoms  start?"     Monday 3. BETTER-SAME-WORSE: "Are you getting better, staying the same, or getting worse compared to the day you were discharged?"     Antibx helping but concerned about mucus and blood 4. BREATHING DIFFICULTY: "Are you having any difficulty breathing?" If Yes, ask: "How bad is it?"  (e.g., none, mild, moderate, severe)   - MILD: No SOB at rest, mild SOB with walking, speaks normally in sentences, can lie down, no retractions, pulse < 100.    - MODERATE: SOB at rest, SOB with minimal exertion and prefers to sit, cannot lie down flat, speaks in phrases, mild retractions, audible wheezing, pulse 100-120.    - SEVERE: Very SOB at rest, speaks in single words, struggling to breathe, sitting hunched forward, retractions, pulse > 120      SOB with exertion more SOB when sick here,  but denies difficulty breathing or walking, confirms hears quiet crackling at times 5. FEVER: "Do you have a fever?" If Yes, ask: "What is your temperature, how was it measured, and when did it start?"     denies 6. SPUTUM: "Describe the color of your sputum" (clear, white, yellow, green, blood-tinged)     Mostly clear, sometimes dark dark hard pieces brown and little blood, red drops of blood, tiny 7. DIAGNOSIS CONFIRMATION: "When was the pneumonia diagnosed?" "By whom?"     UC on Monday along with bronchitis 8. ANTIBIOTIC: "Are you taking an antibiotic?"  If Yes, ask: "Which one?" "When was it started?"     Yes levofloxacin Monday, today is day 5 of 10 9. OTHER TREATMENT: "Are you receiving any other treatment for the pneumonia?" (e.g., albuterol nebulizer, oxygen) If Yes, ask: "How often?" and "Does it help?"     Has her albuterol inhaler  Protocols used: Pneumonia Follow-up Call-A-AH

## 2023-07-13 ENCOUNTER — Telehealth: Payer: Self-pay | Admitting: Internal Medicine

## 2023-07-13 NOTE — Telephone Encounter (Signed)
ATC x1.  LVM to return call. 

## 2023-07-13 NOTE — Telephone Encounter (Signed)
 Patient has had a cough for over a month now and doesn't feel well. We do not have any acute appointments and patient does not want to go to urgent care. Please call and advise [phone]

## 2023-07-14 NOTE — Telephone Encounter (Signed)
 ATC pt. Lmtcb

## 2023-07-14 NOTE — Telephone Encounter (Signed)
 ATC x3.  LVM to return call.

## 2023-07-15 ENCOUNTER — Institutional Professional Consult (permissible substitution): Payer: Self-pay | Admitting: Pulmonary Disease

## 2023-07-16 NOTE — Telephone Encounter (Signed)
 3 attempts have been made to contact pt. I sent her a Mychart message asking her to contact our office. Clearing message.

## 2023-07-20 ENCOUNTER — Telehealth: Payer: Self-pay | Admitting: Cardiology

## 2023-07-20 NOTE — Telephone Encounter (Signed)
 Pt requested to make an appointment with Dr. Bing Matter and did not wish to discuss BP. Appointment made as requested

## 2023-07-20 NOTE — Telephone Encounter (Signed)
 Patient called to talk with Dr. Bing Matter or nurse about blood pressure. Please call back

## 2023-07-30 ENCOUNTER — Emergency Department (HOSPITAL_BASED_OUTPATIENT_CLINIC_OR_DEPARTMENT_OTHER)
Admission: EM | Admit: 2023-07-30 | Discharge: 2023-07-30 | Disposition: A | Discharge status: Home / Self Care | Attending: Emergency Medicine | Admitting: Emergency Medicine

## 2023-07-30 ENCOUNTER — Other Ambulatory Visit: Payer: Self-pay

## 2023-07-30 ENCOUNTER — Encounter (HOSPITAL_BASED_OUTPATIENT_CLINIC_OR_DEPARTMENT_OTHER): Payer: Self-pay | Admitting: Emergency Medicine

## 2023-07-30 DIAGNOSIS — R519 Headache, unspecified: Secondary | ICD-10-CM | POA: Diagnosis present

## 2023-07-30 DIAGNOSIS — Z7982 Long term (current) use of aspirin: Secondary | ICD-10-CM | POA: Diagnosis not present

## 2023-07-30 DIAGNOSIS — R0602 Shortness of breath: Secondary | ICD-10-CM | POA: Diagnosis not present

## 2023-07-30 DIAGNOSIS — Z79899 Other long term (current) drug therapy: Secondary | ICD-10-CM | POA: Insufficient documentation

## 2023-07-30 DIAGNOSIS — Z8616 Personal history of COVID-19: Secondary | ICD-10-CM | POA: Diagnosis not present

## 2023-07-30 LAB — CBC WITH DIFFERENTIAL/PLATELET
Abs Immature Granulocytes: 0.01 10*3/uL (ref 0.00–0.07)
Basophils Absolute: 0.1 10*3/uL (ref 0.0–0.1)
Basophils Relative: 1 %
Eosinophils Absolute: 0.2 10*3/uL (ref 0.0–0.5)
Eosinophils Relative: 4 %
HCT: 36.1 % (ref 36.0–46.0)
Hemoglobin: 12.2 g/dL (ref 12.0–15.0)
Immature Granulocytes: 0 %
Lymphocytes Relative: 28 %
Lymphs Abs: 1.4 10*3/uL (ref 0.7–4.0)
MCH: 30.7 pg (ref 26.0–34.0)
MCHC: 33.8 g/dL (ref 30.0–36.0)
MCV: 90.7 fL (ref 80.0–100.0)
Monocytes Absolute: 0.5 10*3/uL (ref 0.1–1.0)
Monocytes Relative: 10 %
Neutro Abs: 2.9 10*3/uL (ref 1.7–7.7)
Neutrophils Relative %: 57 %
Platelets: 217 10*3/uL (ref 150–400)
RBC: 3.98 MIL/uL (ref 3.87–5.11)
RDW: 12.3 % (ref 11.5–15.5)
WBC: 5 10*3/uL (ref 4.0–10.5)
nRBC: 0 % (ref 0.0–0.2)

## 2023-07-30 LAB — COMPREHENSIVE METABOLIC PANEL
ALT: 23 U/L (ref 0–44)
AST: 20 U/L (ref 15–41)
Albumin: 4.2 g/dL (ref 3.5–5.0)
Alkaline Phosphatase: 34 U/L — ABNORMAL LOW (ref 38–126)
Anion gap: 9 (ref 5–15)
BUN: 18 mg/dL (ref 8–23)
CO2: 24 mmol/L (ref 22–32)
Calcium: 8.6 mg/dL — ABNORMAL LOW (ref 8.9–10.3)
Chloride: 108 mmol/L (ref 98–111)
Creatinine, Ser: 0.65 mg/dL (ref 0.44–1.00)
GFR, Estimated: >60 mL/min (ref >60)
Glucose, Bld: 105 mg/dL — ABNORMAL HIGH (ref 70–99)
Potassium: 3.9 mmol/L (ref 3.5–5.1)
Sodium: 141 mmol/L (ref 135–145)
Total Bilirubin: 0.4 mg/dL (ref 0.0–1.2)
Total Protein: 6.5 g/dL (ref 6.5–8.1)

## 2023-07-30 LAB — TROPONIN I (HIGH SENSITIVITY): Troponin I (High Sensitivity): 3 ng/L (ref <18)

## 2023-07-30 MED ORDER — SODIUM CHLORIDE 0.9 % IV BOLUS
1000.0000 mL | Freq: Once | INTRAVENOUS | Status: AC
  Administered 2023-07-30: 1000 mL via INTRAVENOUS

## 2023-07-30 MED ORDER — DEXAMETHASONE 4 MG PO TABS
10.0000 mg | ORAL_TABLET | Freq: Once | ORAL | Status: AC
  Administered 2023-07-30: 10 mg via ORAL
  Filled 2023-07-30: qty 3

## 2023-07-30 MED ORDER — KETOROLAC TROMETHAMINE 15 MG/ML IJ SOLN
15.0000 mg | Freq: Once | INTRAMUSCULAR | Status: AC
  Administered 2023-07-30: 15 mg via INTRAVENOUS
  Filled 2023-07-30: qty 1

## 2023-07-30 MED ORDER — PROCHLORPERAZINE EDISYLATE 10 MG/2ML IJ SOLN
10.0000 mg | Freq: Once | INTRAMUSCULAR | Status: AC
  Administered 2023-07-30: 10 mg via INTRAVENOUS
  Filled 2023-07-30: qty 2

## 2023-07-30 MED ORDER — DIPHENHYDRAMINE HCL 50 MG/ML IJ SOLN
25.0000 mg | Freq: Once | INTRAMUSCULAR | Status: AC
  Administered 2023-07-30: 25 mg via INTRAVENOUS
  Filled 2023-07-30: qty 1

## 2023-07-30 NOTE — Discharge Instructions (Signed)
 Your labs look good!  Hopefully this broke your headache cycle.  Please follow-up with your family doctor in the office.

## 2023-07-30 NOTE — ED Provider Notes (Signed)
 [place] EMERGENCY DEPARTMENT AT [facility] Provider Note   CSN: [id number] Arrival date & time: 07/30/23  4098     History  Chief Complaint  Patient presents with   Headache    Kristin [name] is a 63 y.o. female.  63 yo F with a chief complaints of persistent fatigue since she had had COVID about 2 months ago.  She has developed a headache over the past few days.  She is upset because it does not seem to be getting better.  She also has been worried about her blood pressure with this.  She has had some shortness of breath with exertion has been ongoing for the past couple months.  Has seen her family doctor multiple times and has been on multiple courses of antibiotics for possible bronchitis.  Tried steroids as well.   Headache      Home Medications Prior to Admission medications   Medication Sig Start Date End Date Taking? Authorizing Provider  omeprazole (PRILOSEC) 40 MG capsule Take 40 mg by mouth daily. 05/07/23  Yes [provider]  ondansetron (ZOFRAN-ODT) 4 MG disintegrating tablet Take 4 mg by mouth every 8 (eight) hours as needed. 07/28/23  Yes [provider]  aspirin EC 81 MG tablet Take 1 tablet (81 mg total) by mouth daily. Swallow whole. 04/19/21   Georgeanna Lea, MD  BIOTIN PO Take 1 tablet by mouth daily in the afternoon.    [provider]  Boswellia-Glucosamine-Vit D (OSTEO BI-FLEX ONE PER DAY PO) Take 1 tablet by mouth daily in the afternoon. 06/19/14   [provider]  CALCIUM PO Take 1 tablet by mouth daily in the afternoon.    [provider]  cetirizine (ZYRTEC) 10 MG tablet Take 10 mg by mouth daily as needed for allergies. 10/16/20   [provider]  Cholecalciferol (VITAMIN D-3 PO) Take 1 capsule by mouth daily in the afternoon.    [provider]  clobetasol (TEMOVATE) 0.05 % external solution Apply 1 application  topically 2 (two) times daily as needed (scalp irritation).  03/15/18   [provider]  Coenzyme Q10 (COQ10 PO) Take 1 capsule by mouth daily in the afternoon.    [provider]  estradiol (ESTRACE) 0.5 MG tablet TAKE 1 TABLET BY MOUTH DAILY 06/25/23   Jerene Bears, MD  EVENING PRIMROSE OIL PO Take 1 capsule by mouth daily in the afternoon.    [provider]  finasteride (PROSCAR) 5 MG tablet Take 5 mg by mouth every evening. 01/17/19   [provider]  hyoscyamine (LEVBID) 0.375 MG 12 hr tablet Take 0.375 mg by mouth at bedtime. 03/10/18   [provider]  ibuprofen (ADVIL,MOTRIN) 200 MG tablet Take 200-400 mg by mouth 2 (two) times daily as needed for mild pain, moderate pain or headache.    [provider]  metoprolol tartrate (LOPRESSOR) 25 MG tablet Take 1 tablet (25 mg total) by mouth 2 (two) times daily. 02/27/23   Georgeanna Lea, MD  Multiple Vitamin (MULTIVITAMIN WITH MINERALS) TABS tablet Take 1 tablet by mouth daily in the afternoon. Unknown strength    [provider]  progesterone (PROMETRIUM) 200 MG capsule Take 1 capsule (200 mg total) by mouth daily. Patient taking differently: Take 200 mg by mouth at bedtime. 05/23/22   Jerene Bears, MD  rosuvastatin (CRESTOR) 5 MG tablet Take 1 tablet (5 mg total) by mouth daily. 02/27/23   Georgeanna Lea, MD  VITAMIN E  PO Take 1 capsule by mouth daily in the afternoon.    [provider]      Allergies    Sanctura [trospium], Zithromax [azithromycin], Ditropan [oxybutynin], Nickel, Other, Sporanox [itraconazole], and Voltaren [diclofenac]    Review of Systems   Review of Systems  Neurological:  Positive for headaches.    Physical Exam Updated Vital Signs BP (!) 177/93   Pulse 71   Temp 98 F (36.7 C)   Resp 20   Wt 80.7 kg   LMP 12/28/2010   SpO2 100%   BMI 32.56 kg/m  Physical Exam Vitals and nursing note reviewed.  Constitutional:      General: She is not in acute distress.    Appearance: She is  well-developed. She is not diaphoretic.  HENT:     Head: Normocephalic and atraumatic.  Eyes:     Pupils: Pupils are equal, round, and reactive to light.  Cardiovascular:     Rate and Rhythm: Normal rate and regular rhythm.     Heart sounds: No murmur heard.    No friction rub. No gallop.  Pulmonary:     Effort: Pulmonary effort is normal.     Breath sounds: No wheezing or rales.  Abdominal:     General: There is no distension.     Palpations: Abdomen is soft.     Tenderness: There is no abdominal tenderness.  Musculoskeletal:        General: No tenderness.     Cervical back: Normal range of motion and neck supple.  Skin:    General: Skin is warm and dry.  Neurological:     Mental Status: She is alert and oriented to person, place, and time.     GCS: GCS eye subscore is 4. GCS verbal subscore is 5. GCS motor subscore is 6.     Cranial Nerves: Cranial nerves 2-12 are intact.     Sensory: Sensation is intact.     Motor: Motor function is intact.     Coordination: Coordination is intact.     Comments: Benign neuro exam  Psychiatric:        Behavior: Behavior normal.     ED Results / Procedures / Treatments   Labs (all labs ordered are listed, but only abnormal results are displayed) Labs Reviewed  COMPREHENSIVE METABOLIC PANEL - Abnormal; Notable for the following components:      Result Value   Glucose, Bld 105 (*)    Calcium 8.6 (*)    Alkaline Phosphatase 34 (*)    All other components within normal limits  CBC WITH DIFFERENTIAL/PLATELET  TROPONIN I (HIGH SENSITIVITY)    EKG EKG Interpretation Date/Time:  Thursday July 30 2023 08:32:00 EDT Ventricular Rate:  62 PR Interval:  181 QRS Duration:  88 QT Interval:  402 QTC Calculation: 409 R Axis:   71  Text Interpretation: Sinus rhythm Borderline repolarization abnormality downsloping st segments in inferior leads seen on prior No significant change since last tracing Confirmed by Melene Plan [id number]) on 07/30/2023  8:40:00 AM  Radiology No results found.  Procedures Procedures    Medications Ordered in ED Medications  sodium chloride 0.9 % bolus 1,000 mL (1,000 mLs Intravenous New Bag/Given 07/30/23 0901)  prochlorperazine (COMPAZINE) injection 10 mg (10 mg Intravenous Given 07/30/23 0851)  diphenhydrAMINE (BENADRYL) injection 25 mg (25 mg Intravenous Given 07/30/23 0855)  ketorolac (TORADOL) 15 MG/ML injection 15 mg (15 mg Intravenous Given 07/30/23 0852)  dexamethasone (DECADRON) tablet 10 mg (10 mg Oral Given  07/30/23 Gaudiosa.[email or web address])    ED Course/ Medical Decision Making/ A&P                                 Medical Decision Making Amount and/or Complexity of Data Reviewed Labs: ordered. ECG/medicine tests: ordered.  Risk Prescription drug management.   61 y oF with a chief complaints of fatigue shortness of breath on exertion going on for a couple months after she has had COVID.  She also is describing headache that is mostly to the top of her head.  Sometimes goes into the occiput.  I think this is most consistent with a tension headache.  She has a benign neurologic exam.  Will treat with a headache cocktail here.  Check basic blood work.  She has a follow-up appointment with her family doctor tomorrow.  No acute anemia no significant electrolyte abnormalities troponin negative.  Will have the patient follow-up with her family doctor in the office.  9:41 AM:  I have discussed the diagnosis/risks/treatment options with the patient.  Evaluation and diagnostic testing in the emergency department does not suggest an emergent condition requiring admission or immediate intervention beyond what has been performed at this time.  They will follow up with PCP. We also discussed returning to the ED immediately if new or worsening sx occur. We discussed the sx which are most concerning (e.g., sudden worsening pain, fever, inability to tolerate by mouth) that necessitate immediate return. Medications administered  to the patient during their visit and any new prescriptions provided to the patient are listed below.  Medications given during this visit Medications  sodium chloride 0.9 % bolus 1,000 mL (1,000 mLs Intravenous New Bag/Given 07/30/23 0901)  prochlorperazine (COMPAZINE) injection 10 mg (10 mg Intravenous Given 07/30/23 0851)  diphenhydrAMINE (BENADRYL) injection 25 mg (25 mg Intravenous Given 07/30/23 0855)  ketorolac (TORADOL) 15 MG/ML injection 15 mg (15 mg Intravenous Given 07/30/23 0852)  dexamethasone (DECADRON) tablet 10 mg (10 mg Oral Given 07/30/23 0902)     The patient appears reasonably screen and/or stabilized for discharge and I doubt any other medical condition or other [facility] requiring further screening, evaluation, or treatment in the ED at this time prior to discharge.          Final Clinical Impression(s) / ED Diagnoses Final diagnoses:  Vertex headache    Rx / DC Orders ED Discharge Orders     None         Melene Plan, DO 07/30/23 [phone]

## 2023-07-30 NOTE — ED Triage Notes (Signed)
 In January pt had COVID, feb had pneumonia and bronchitis, hasn't felt well since. Blood pressure has been waxing and waning. Past week with a constant headache, highest blood pressure 160/95. Pt has an appt with primary tomorrow but wanted to come here today.

## 2023-08-01 ENCOUNTER — Other Ambulatory Visit (HOSPITAL_BASED_OUTPATIENT_CLINIC_OR_DEPARTMENT_OTHER): Payer: Self-pay | Admitting: Obstetrics & Gynecology

## 2023-08-01 DIAGNOSIS — Z79890 Hormone replacement therapy (postmenopausal): Secondary | ICD-10-CM

## 2023-08-02 ENCOUNTER — Other Ambulatory Visit: Payer: Self-pay

## 2023-08-02 ENCOUNTER — Emergency Department (HOSPITAL_BASED_OUTPATIENT_CLINIC_OR_DEPARTMENT_OTHER)

## 2023-08-02 ENCOUNTER — Emergency Department (HOSPITAL_BASED_OUTPATIENT_CLINIC_OR_DEPARTMENT_OTHER)
Admission: EM | Admit: 2023-08-02 | Discharge: 2023-08-02 | Disposition: A | Discharge status: Home / Self Care | Attending: Emergency Medicine | Admitting: Emergency Medicine

## 2023-08-02 ENCOUNTER — Emergency Department (HOSPITAL_BASED_OUTPATIENT_CLINIC_OR_DEPARTMENT_OTHER): Admitting: Radiology

## 2023-08-02 DIAGNOSIS — Z7982 Long term (current) use of aspirin: Secondary | ICD-10-CM | POA: Diagnosis not present

## 2023-08-02 DIAGNOSIS — I10 Essential (primary) hypertension: Secondary | ICD-10-CM | POA: Diagnosis not present

## 2023-08-02 DIAGNOSIS — Z79899 Other long term (current) drug therapy: Secondary | ICD-10-CM | POA: Diagnosis not present

## 2023-08-02 DIAGNOSIS — R519 Headache, unspecified: Secondary | ICD-10-CM | POA: Diagnosis present

## 2023-08-02 MED ORDER — ONDANSETRON 4 MG PO TBDP: 4.0000 mg | ORAL_TABLET | Freq: Three times a day (TID) | ORAL | 0 refills | Status: DC | PRN

## 2023-08-02 MED ORDER — ALBUTEROL SULFATE HFA 108 (90 BASE) MCG/ACT IN AERS: 2.0000 | INHALATION_SPRAY | RESPIRATORY_TRACT | Status: DC | PRN

## 2023-08-02 MED ORDER — ONDANSETRON 4 MG PO TBDP
4.0000 mg | ORAL_TABLET | Freq: Once | ORAL | Status: AC
  Administered 2023-08-02: 4 mg via ORAL
  Filled 2023-08-02: qty 1

## 2023-08-02 NOTE — ED Triage Notes (Signed)
 PT POV c/o ongoing headache and HTN x10 days. Was seen last Thurs for same, seen by PCP Friday where they doubled metoprolol dose to 100 mg/day. Also c/o [facility] w/ exertion and nightsweats

## 2023-08-02 NOTE — Discharge Instructions (Addendum)
 Today you were seen for hypertension and headache.  Pick up your Zofran take as needed for nausea.  Please follow-up with neurology, cardiology, and pulmonology for further evaluation and treatment.  Thank you for letting us treat you today. After reviewing your labs and imaging, I feel you are safe to go home. Please follow up with your PCP in the next several days and provide them with your records from this visit. Return to the Emergency Room if pain becomes severe or symptoms worsen.

## 2023-08-02 NOTE — ED Provider Notes (Signed)
 [place] EMERGENCY DEPARTMENT AT [facility] Provider Note   CSN: [id number] Arrival date & time: 08/02/23  1523     History  No chief complaint on file.   Kristin [name] is a 63 y.o. female presents today for headache and hypertension x 10 days.  Patient was seen here for same on Thursday or hypertensive emergency was ruled out.  Patient states this is different from her normal headache and Dors is nausea and sensitivity to light.  Patient denies fever, chills, vomiting, chest pain, numbness, weakness, or abdominal pain.  Patient was seen by her PCP on Friday and had her metoprolol dose doubled to 100 mg/day.  Patient also reports shortness of breath with exertion that has been going on for multiple months.  Patient has seen her family doctor multiple times and has been on multiple courses of antibiotics for possible bronchitis as well as steroids.  Patient is seen by cardiology and is scheduled to see pulmonology next month.  Patient is concern for brain tumor.  HPI     Home Medications Prior to Admission medications   Medication Sig Start Date End Date Taking? Authorizing Provider  ondansetron (ZOFRAN-ODT) 4 MG disintegrating tablet Take 1 tablet (4 mg total) by mouth every 8 (eight) hours as needed for nausea or vomiting. 08/02/23  Yes Dolphus Jenny, PA-C  aspirin EC 81 MG tablet Take 1 tablet (81 mg total) by mouth daily. Swallow whole. 04/19/21   Georgeanna Lea, MD  BIOTIN PO Take 1 tablet by mouth daily in the afternoon.    [provider]  Boswellia-Glucosamine-Vit D (OSTEO BI-FLEX ONE PER DAY PO) Take 1 tablet by mouth daily in the afternoon. 06/19/14   [provider]  CALCIUM PO Take 1 tablet by mouth daily in the afternoon.    [provider]  cetirizine (ZYRTEC) 10 MG tablet Take 10 mg by mouth daily as needed for allergies. 10/16/20   [provider]  Cholecalciferol (VITAMIN D-3 PO) Take 1 capsule by mouth daily in the  afternoon.    [provider]  clobetasol (TEMOVATE) 0.05 % external solution Apply 1 application  topically 2 (two) times daily as needed (scalp irritation). 03/15/18   [provider]  Coenzyme Q10 (COQ10 PO) Take 1 capsule by mouth daily in the afternoon.    [provider]  estradiol (ESTRACE) 0.5 MG tablet TAKE 1 TABLET BY MOUTH DAILY 06/25/23   Jerene Bears, MD  EVENING PRIMROSE OIL PO Take 1 capsule by mouth daily in the afternoon.    [provider]  finasteride (PROSCAR) 5 MG tablet Take 5 mg by mouth every evening. 01/17/19   [provider]  hyoscyamine (LEVBID) 0.375 MG 12 hr tablet Take 0.375 mg by mouth at bedtime. 03/10/18   [provider]  ibuprofen (ADVIL,MOTRIN) 200 MG tablet Take 200-400 mg by mouth 2 (two) times daily as needed for mild pain, moderate pain or headache.    [provider]  metoprolol tartrate (LOPRESSOR) 25 MG tablet Take 1 tablet (25 mg total) by mouth 2 (two) times daily. 02/27/23   Georgeanna Lea, MD  Multiple Vitamin (MULTIVITAMIN WITH MINERALS) TABS tablet Take 1 tablet by mouth daily in the afternoon. Unknown strength    [provider]  omeprazole (PRILOSEC) 40 MG capsule Take 40 mg by mouth daily. 05/07/23   [provider]  progesterone (PROMETRIUM) 200 MG capsule Take 1 capsule (200 mg total) by mouth daily. Patient taking differently: Take  200 mg by mouth at bedtime. 05/23/22   Jerene Bears, MD  rosuvastatin (CRESTOR) 5 MG tablet Take 1 tablet (5 mg total) by mouth daily. 02/27/23   Georgeanna Lea, MD  VITAMIN E PO Take 1 capsule by mouth daily in the afternoon.    [provider]      Allergies    Sanctura [trospium], Zithromax [azithromycin], Ditropan [oxybutynin], Nickel, Other, Sporanox [itraconazole], and Voltaren [diclofenac]    Review of Systems   Review of Systems  Neurological:  Positive for headaches.    Physical Exam Updated Vital  Signs BP (!) 140/93   Pulse 63   Temp 97.9 F (36.6 C) (Temporal)   Resp 15   Ht 5\' 3"  (1.6 m)   Wt 80.7 kg   LMP 12/28/2010   SpO2 99%   BMI 31.53 kg/m  Physical Exam Vitals and nursing note reviewed.  Constitutional:      General: She is not in acute distress.    Appearance: Normal appearance. She is well-developed. She is not ill-appearing, toxic-appearing or diaphoretic.  HENT:     Head: Normocephalic and atraumatic.     Nose: Nose normal.  Eyes:     Extraocular Movements: Extraocular movements intact.     Conjunctiva/sclera: Conjunctivae normal.     Pupils: Pupils are equal, round, and reactive to light.  Cardiovascular:     Rate and Rhythm: Normal rate and regular rhythm.     Pulses: Normal pulses.     Heart sounds: Normal heart sounds. No murmur heard. Pulmonary:     Effort: Pulmonary effort is normal. No respiratory distress.     Breath sounds: Normal breath sounds.  Musculoskeletal:        General: No swelling.     Cervical back: Normal range of motion and neck supple. No rigidity.  Skin:    General: Skin is warm and dry.     Capillary Refill: Capillary refill takes less than 2 seconds.  Neurological:     General: No focal deficit present.     Mental Status: She is alert and oriented to person, place, and time.     GCS: GCS eye subscore is 4. GCS verbal subscore is 5. GCS motor subscore is 6.     Cranial Nerves: Cranial nerves 2-12 are intact. No cranial nerve deficit.     Sensory: No sensory deficit.     Motor: No weakness.     Coordination: Coordination normal.  Psychiatric:        Mood and Affect: Mood normal.     ED Results / Procedures / Treatments   Labs (all labs ordered are listed, but only abnormal results are displayed) Labs Reviewed - No data to display  EKG None  Radiology CT Head Wo Contrast Result Date: 08/02/2023 EXAM: CT HEAD WITHOUT CONTRAST TECHNIQUE: Contiguous axial images were obtained from the base of the skull through the  vertex without intravenous contrast. RADIATION DOSE REDUCTION: This exam was performed according to the departmental dose-optimization program which includes automated exposure control, adjustment of the mA and/or kV according to patient size and/or use of iterative reconstruction technique. COMPARISON:  11/12/2020 FINDINGS: Brain: The brain shows a normal appearance without evidence of malformation, atrophy, old or acute small or large vessel infarction, mass lesion, hemorrhage, hydrocephalus or extra-axial collection. Vascular: No hyperdense vessel. No evidence of atherosclerotic calcification. Skull: Normal.  No traumatic finding.  No focal bone lesion. Sinuses/Orbits: Few small retention cysts. No significant sinus inflammatory disease. Orbits negative.  Other: None significant IMPRESSION: Normal head CT. Few small retention cysts in the sinuses. Electronically Signed   By: Paulina Fusi M.D.   On: 08/02/2023 17:38   DG Chest 2 View Result Date: 08/02/2023 CLINICAL DATA:  Shortness of breath. EXAM: CHEST - 2 VIEW COMPARISON:  02/27/2023. FINDINGS: Linear area of atelectasis noted overlying the left lower lung zone, laterally. Bilateral lung fields are otherwise clear. Bilateral costophrenic angles are clear. Normal cardio-mediastinal silhouette. No acute osseous abnormalities. The soft tissues are within normal limits. IMPRESSION: No active cardiopulmonary disease. Electronically Signed   By: Jules Schick M.D.   On: 08/02/2023 15:55    Procedures Procedures    Medications Ordered in ED Medications  albuterol (VENTOLIN HFA) 108 (90 Base) MCG/ACT inhaler 2 puff (has no administration in time range)  ondansetron (ZOFRAN-ODT) disintegrating tablet 4 mg (4 mg Oral Given 08/02/23 1743)    ED Course/ Medical Decision Making/ A&P                                 Medical Decision Making Amount and/or Complexity of Data Reviewed Radiology: ordered.  Risk Prescription drug management.   This  patient presents to the ED with chief complaint(s) of hypertension and headache with pertinent past medical history of hypertension which further complicates the presenting complaint. The complaint involves an extensive differential diagnosis and also carries with it a high risk of complications and morbidity.    The differential diagnosis includes hypertension, hypertensive emergency, pneumonia  Additional history obtained: Records reviewed Care Everywhere/External Records  ED Course and Reassessment:   Independent labs interpretation:  The following labs were independently interpreted:  EKG: Normal sinus rhythm  Independent visualization of imaging: - I independently visualized the following imaging with scope of interpretation limited to determining acute life threatening conditions related to emergency care: Chest x-ray, which revealed no active cardiopulmonary disease CT head Noncon: Normal head CT.  Few small retention cyst in the sinuses.  Consultation: - Consulted or discussed management/test interpretation w/ external professional: None  Consideration for admission or further workup: Consider for admission or further workup however patient's vital signs, physical exam, labs, and imaging were reassuring.  Patient's blood pressure while in ED has dropped to 140s/80s, at this time I do not feel adding a blood pressure medication would be beneficial as it may drop her blood pressure too low as this is done in the past.  Patient will be given ambulatory neurology referral.  Patient has follow-up with cardiology and pulmonology already.  Patient prescribed short course of Zofran as needed for nausea.        Final Clinical Impression(s) / ED Diagnoses Final diagnoses:  Hypertension, unspecified type  Nonintractable headache, unspecified chronicity pattern, unspecified headache type    Rx / DC Orders ED Discharge Orders          Ordered    Ambulatory referral to Neurology        Comments: An appointment is requested in approximately: 2 weeks   08/02/23 1806    ondansetron (ZOFRAN-ODT) 4 MG disintegrating tablet  Every 8 hours PRN        08/02/23 1806              Dolphus Jenny, PA-C 08/02/23 1806    Arby Barrette, MD 08/06/23 1125

## 2023-08-03 NOTE — Telephone Encounter (Signed)
 Left vm for pt to return phone call in regards to rx refill. Needs an appt.

## 2023-08-10 ENCOUNTER — Ambulatory Visit: Payer: 59 | Admitting: Internal Medicine

## 2023-08-18 ENCOUNTER — Other Ambulatory Visit (HOSPITAL_COMMUNITY)
Admission: RE | Admit: 2023-08-18 | Discharge: 2023-08-18 | Disposition: A | Discharge status: Home / Self Care | Source: Ambulatory Visit | Attending: Obstetrics & Gynecology | Admitting: Obstetrics & Gynecology

## 2023-08-18 ENCOUNTER — Ambulatory Visit (INDEPENDENT_AMBULATORY_CARE_PROVIDER_SITE_OTHER): Payer: 59 | Admitting: Obstetrics & Gynecology

## 2023-08-18 ENCOUNTER — Encounter (HOSPITAL_BASED_OUTPATIENT_CLINIC_OR_DEPARTMENT_OTHER): Payer: Self-pay | Admitting: Obstetrics & Gynecology

## 2023-08-18 VITALS — BP 145/88 | HR 66 | Ht 63.0 in | Wt 178.0 lb

## 2023-08-18 DIAGNOSIS — R232 Flushing: Secondary | ICD-10-CM

## 2023-08-18 DIAGNOSIS — Z124 Encounter for screening for malignant neoplasm of cervix: Secondary | ICD-10-CM | POA: Insufficient documentation

## 2023-08-18 DIAGNOSIS — Z01419 Encounter for gynecological examination (general) (routine) without abnormal findings: Secondary | ICD-10-CM

## 2023-08-18 DIAGNOSIS — R030 Elevated blood-pressure reading, without diagnosis of hypertension: Secondary | ICD-10-CM | POA: Diagnosis not present

## 2023-08-18 DIAGNOSIS — R61 Generalized hyperhidrosis: Secondary | ICD-10-CM

## 2023-08-18 DIAGNOSIS — Z79890 Hormone replacement therapy (postmenopausal): Secondary | ICD-10-CM

## 2023-08-18 NOTE — Progress Notes (Unsigned)
 ANNUAL EXAM Patient name: Kristin [name] MRN [id number]  Date of birth: 31-Mar-1961 Chief Complaint:   Annual Exam  History of Present Illness:   Kristin [name] is a 63 y.o. G72P1001 Caucasian female being seen today for a routine annual exam.  She had Covid and then pneumonia this winter.  She has been to the emergency room twice.  Her father in law passed this last year as well.    Biggest complaint is that she has started having hot flashes and night sweats over the past few months.  Night sweats wake her up and are quite bothersome.  She is also having some issues with her blood pressure.  She is now on metoprolol 50mg  BID.  She is having a lot of hot flashes and night sweats as well.    Has gained a little weight as well and frustrated with this.    Patient's last menstrual period was 12/28/2010.  Last pap 03/14/2021. Results were:  neg with neg HR HPV . Last mammogram:03/16/2023 . Results were: normal. Family h/o breast cancer: no Last colonoscopy: 01/01/2016. Results were: abnormal polyp with 7 year follow up . Family h/o colorectal cancer: no DEXA:  11/01/2018 normal.       03/14/2021    2:08 PM  Depression screen PHQ 2/9  Decreased Interest 0  Down, Depressed, Hopeless 0  PHQ - 2 Score 0    Review of Systems:   Pertinent items are noted in HPI  Denies any vaginal bleeding or discharge.  She does have some urinary leakage with coughing or lifting or sneezing. Pertinent History Reviewed:  Reviewed past medical,surgical, social and family history.   Reviewed problem list, medications and allergies. Physical Assessment:   Vitals:   08/18/23 1043  BP: (!) 145/88  Pulse: 66  Weight: 178 lb (80.7 kg)  Height: 5\' 3"  (1.6 m)  Body mass index is 31.53 kg/m.        Physical Examination:   General appearance - well appearing, and in no distress  Mental status - alert, oriented to person, place, and time  Psych:  She has a normal mood and affect  Skin - warm and dry,  normal color, no suspicious lesions noted  Chest - effort normal, all lung fields clear to auscultation bilaterally  Heart - normal rate and regular rhythm  Neck:  midline trachea, no thyromegaly or nodules  Breasts - breasts appear normal, no suspicious masses, no skin or nipple changes or  axillary nodes  Abdomen - soft, nontender, nondistended, no masses or organomegaly  Pelvic - VULVA: normal appearing vulva with no masses, tenderness or lesions    VAGINA: normal appearing vagina with normal color and discharge, no lesions    CERVIX: normal appearing cervix without discharge or lesions, no CMT  Thin prep pap is done with HR HPV cotesting  UTERUS: uterus is felt to be normal size, shape, consistency and nontender   ADNEXA: No adnexal masses or tenderness noted.  Rectal - normal rectal, good sphincter tone, no masses felt.   Extremities:  No swelling or varicosities noted  Chaperone present for exam  Assessment & Plan:  1. Well woman exam with routine gynecological exam (Primary) - Pap smear and HR HPV obtained today - Mammogram 03/16/2023 - Colonoscopy 01/01/2016, follow up 7 years - Bone mineral density 11/01/2018, normal - lab work done with PCP, Dr. Kateri Plummer - vaccines reviewed/updated  2. Cervical cancer screening - Cytology - PAP( [place])  3. Hot flashes  4.  Night sweats - TSH Rfx on Abnormal to Free T4 - Hemoglobin A1c - C-reactive protein  5. Elevated blood pressure reading - Catecholamines, fractionated, Urine, 24 hour - Metanephrines, Urine, 24 hour - 5 HIAA, quantitative, urine, 24 hour; Future  6. Hormone replacement therapy (HRT) - RF for estradiol 0.5mg  daily and Prometrium 200mg  daily.  #90/3RF   Orders Placed This Encounter  Procedures   TSH Rfx on Abnormal to Free T4   Hemoglobin A1c   C-reactive protein   Catecholamines, fractionated, Urine, 24 hour   Metanephrines, Urine, 24 hour   5 HIAA, quantitative, urine, 24 hour    Meds:  Meds ordered  this encounter  Medications   estradiol (ESTRACE) 0.5 MG tablet    Sig: Take 1 tablet (0.5 mg total) by mouth daily.    Dispense:  90 tablet    Refill:  3   progesterone (PROMETRIUM) 200 MG capsule    Sig: Take 1 capsule (200 mg total) by mouth daily.    Dispense:  90 capsule    Refill:  3    Follow-up: Return for Follow-up pending lab results.  Jerene Bears, MD 08/21/2023 10:49 PM

## 2023-08-19 ENCOUNTER — Encounter (HOSPITAL_BASED_OUTPATIENT_CLINIC_OR_DEPARTMENT_OTHER): Payer: Self-pay | Admitting: Obstetrics & Gynecology

## 2023-08-19 LAB — CYTOLOGY - PAP
Adequacy: ABSENT
Comment: NEGATIVE
Diagnosis: NEGATIVE
High risk HPV: NEGATIVE

## 2023-08-19 LAB — TSH RFX ON ABNORMAL TO FREE T4: TSH: 1.81 u[IU]/mL (ref 0.450–4.500)

## 2023-08-19 LAB — HEMOGLOBIN A1C
Est. average glucose Bld gHb Est-mCnc: 120 mg/dL
Hgb A1c MFr Bld: 5.8 % — ABNORMAL HIGH (ref 4.8–5.6)

## 2023-08-19 LAB — C-REACTIVE PROTEIN: CRP: 1 mg/L (ref 0–10)

## 2023-08-20 ENCOUNTER — Other Ambulatory Visit (HOSPITAL_BASED_OUTPATIENT_CLINIC_OR_DEPARTMENT_OTHER): Payer: Self-pay | Admitting: *Deleted

## 2023-08-20 DIAGNOSIS — R030 Elevated blood-pressure reading, without diagnosis of hypertension: Secondary | ICD-10-CM

## 2023-08-21 MED ORDER — ESTRADIOL 0.5 MG PO TABS
0.5000 mg | ORAL_TABLET | Freq: Every day | ORAL | 3 refills | Status: AC
Start: 2023-08-21 — End: ?

## 2023-08-21 MED ORDER — PROGESTERONE 200 MG PO CAPS
200.0000 mg | ORAL_CAPSULE | Freq: Every day | ORAL | 3 refills | Status: AC
Start: 2023-08-21 — End: ?

## 2023-08-24 ENCOUNTER — Other Ambulatory Visit (HOSPITAL_BASED_OUTPATIENT_CLINIC_OR_DEPARTMENT_OTHER): Payer: Self-pay | Admitting: *Deleted

## 2023-08-24 LAB — METANEPHRINES, URINE, 24 HOUR
Metaneph Total, Ur: 37 ug/L
Metanephrines, 24H Ur: 100 ug/(24.h) (ref 36–209)
Normetanephrine, 24H Ur: 302 ug/(24.h) (ref 131–612)
Normetanephrine, Ur: 112 ug/L

## 2023-08-24 LAB — CATECHOLAMINES, FRACTIONATED, URINE, 24 HOUR
Dopamine , 24H Ur: 275 ug/(24.h) (ref 0–510)
Dopamine, Rand Ur: 102 ug/L
Epinephrine, 24H Ur: <8 ug/(24.h) (ref 0–20)
Epinephrine, Rand Ur: <3 ug/L
Norepinephrine, 24H Ur: 49 ug/(24.h) (ref 0–135)
Norepinephrine, Rand Ur: 18 ug/L

## 2023-08-24 LAB — 5 HIAA, QUANTITATIVE, URINE, 24 HOUR
5-HIAA, Ur: 1.2 mg/L
5-HIAA,Quant.,24 Hr Urine: 3.2 mg/(24.h) (ref 0.0–14.9)

## 2023-08-24 MED ORDER — FLUCONAZOLE 150 MG PO TABS: 150.0000 mg | ORAL_TABLET | Freq: Once | ORAL | 0 refills | Status: AC

## 2023-08-26 ENCOUNTER — Ambulatory Visit: Payer: Self-pay | Attending: Cardiology | Admitting: Cardiology

## 2023-08-26 ENCOUNTER — Encounter: Payer: Self-pay | Admitting: Cardiology

## 2023-08-26 VITALS — BP 166/90 | HR 96 | Ht 63.0 in | Wt 179.0 lb

## 2023-08-26 DIAGNOSIS — I77810 Thoracic aortic ectasia: Secondary | ICD-10-CM | POA: Diagnosis not present

## 2023-08-26 DIAGNOSIS — Q211 Atrial septal defect, unspecified: Secondary | ICD-10-CM | POA: Diagnosis not present

## 2023-08-26 DIAGNOSIS — I701 Atherosclerosis of renal artery: Secondary | ICD-10-CM | POA: Diagnosis not present

## 2023-08-26 DIAGNOSIS — Q2381 Bicuspid aortic valve: Secondary | ICD-10-CM

## 2023-08-26 MED ORDER — METOPROLOL SUCCINATE ER 100 MG PO TB24: 100.0000 mg | ORAL_TABLET | Freq: Every day | ORAL | 3 refills | Status: AC

## 2023-08-26 MED ORDER — AMLODIPINE BESYLATE 2.5 MG PO TABS: 2.5000 mg | ORAL_TABLET | Freq: Every day | ORAL | 3 refills | Status: AC | PRN

## 2023-08-26 NOTE — Addendum Note (Signed)
 Addended by: Baldo Ash D on: 08/26/2023 04:18 PM   Modules accepted: Orders

## 2023-08-26 NOTE — Patient Instructions (Addendum)
 Medication Instructions:   START: Metoprolol Succinate 100mg  1 tablet daily  START: Amlodipine 2.5mg  1 tablet as needed for blood pressure above 150/100   Lab Work: None Ordered If you have labs (blood work) drawn today and your tests are completely normal, you will receive your results only by: [employer] (if you have MyChart) OR A paper copy in the mail If you have any lab test that is abnormal or we need to change your treatment, we will call you to review the results.   Testing/Procedures: Your physician has requested that you have a renal artery duplex. During this test, an ultrasound is used to evaluate blood flow to the kidneys. Allow one hour for this exam. Do not eat after midnight the day before and avoid carbonated beverages. Take your medications as you usually do.    Follow-Up: At [facility], you and your health needs are our priority.  As part of our continuing mission to provide you with exceptional heart care, we have created designated Provider Care Teams.  These Care Teams include your primary Cardiologist (physician) and Advanced Practice Providers (APPs -  Physician Assistants and Nurse Practitioners) who all work together to provide you with the care you need, when you need it.  We recommend signing up for the patient portal called "MyChart".  Sign up information is provided on this After Visit Summary.  MyChart is used to connect with patients for Virtual Visits (Telemedicine).  Patients are able to view lab/test results, encounter notes, upcoming appointments, etc.  Non-urgent messages can be sent to your provider as well.   To learn more about what you can do with MyChart, go to [email or web address].    Your next appointment:   3 month(s)  The format for your next appointment:   In Person  Provider:   Gypsy Balsam, MD    Other Instructions NA

## 2023-08-26 NOTE — Progress Notes (Signed)
 Cardiology Office Note:    Date:  08/26/2023   ID:  Kristin [name], DOB 01/09/1961, MRN [id number]  PCP:  Farris Has, MD  Cardiologist:  Gypsy Balsam, MD    Referring MD: Farris Has, MD   Chief Complaint  Patient presents with   Follow-up    History of Present Illness:    Kristin [name] is a 63 y.o. female past medical history significant for questionable bicuspid arctic valve, mild enlargement of the aortic root last measurement 39 mm previously 40, essential hypertension seems to be difficult to control, question about atrial septal defect but last cardiac catheterization did not show any intracardiac communication.  Comes today 2 months of follow-up. Blood pressure is fluctuating and many times when she gets elevation of blood pressure she will have headache blood pressure that she demonstrated to me highest numbers were about 140-150 systolic.  Otherwise seems to be doing fine.  She is upset with herself she gained some weight.  She did have some COVID previously complicated by bronchitis and then pneumonia but finally recovered.  Past Medical History:  Diagnosis Date   Abdominal pain 01/28/2021   Allergy    Alopecia 01/28/2021   Aortic root dilatation (HCC) 06/03/2021   ASD (atrial septal defect) 03/16/2020   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   Atypical chest pain 03/20/2021   Bicuspid aortic valve 03/16/2020   Dyspnea    Dyspnea on exertion 02/23/2012   Followed in Pulmonary clinic/ [facility]/ [place]    - 02/23/2012  Walked RA x 3 laps @ 185 ft each stopped due to  End of study, no tachypnea, tachycardia or desat     Elevated blood-pressure reading, without diagnosis of hypertension 01/28/2021   Endometriosis    High cholesterol 10/16/2020   History of basal cell carcinoma (BCC) 01/16/2023   Hyperlipidemia    Hypertension    IBS (irritable bowel syndrome)    Increased frequency of urination 07/24/2014   Intramural uterine  fibroid 01/29/2019   Lichen planus 01/29/2021   Loss of hair    Pain, abdominal    Right lower quadrant pain 04/16/2021   Seasonal allergic rhinitis 10/16/2020   Skin cancer, basal cell    Stress 01/28/2021   Vitamin D deficiency     Past Surgical History:  Procedure Laterality Date   CESAREAN SECTION  1983   CHOLECYSTECTOMY  1990   In [place]   CYSTOURETHROSCOPY  2016   HYSTEROSCOPY  2015   LAPAROSCOPY  03/12/2016   with resection of pelvic adhesions   LAPAROTOMY  1990   post operative infection from cholecystectomy   OOPHORECTOMY Left 2000   RIGHT/LEFT HEART CATH AND CORONARY ANGIOGRAPHY N/A 04/23/2021   Procedure: RIGHT/LEFT HEART CATH AND CORONARY ANGIOGRAPHY;  Surgeon: Lyn Records, MD;  Location: [facility] INVASIVE CV LAB;  Service: Cardiovascular;  Laterality: N/A;   SKIN CANCER EXCISION     facial    Current Medications: Current Meds  Medication Sig   aspirin EC 81 MG tablet Take 1 tablet (81 mg total) by mouth daily. Swallow whole.   BIOTIN PO Take 1 tablet by mouth daily in the afternoon.   Boswellia-Glucosamine-Vit D (OSTEO BI-FLEX ONE PER DAY PO) Take 1 tablet by mouth daily in the afternoon.   CALCIUM PO Take 1 tablet by mouth daily in the afternoon.   cetirizine (ZYRTEC) 10 MG tablet Take 10 mg by mouth daily as needed for allergies.   Cholecalciferol (VITAMIN D-3 PO) Take  1 capsule by mouth daily in the afternoon.   clobetasol (TEMOVATE) 0.05 % external solution Apply 1 application  topically 2 (two) times daily as needed (scalp irritation).   Coenzyme Q10 (COQ10 PO) Take 1 capsule by mouth daily in the afternoon.   estradiol (ESTRACE) 0.5 MG tablet Take 1 tablet (0.5 mg total) by mouth daily.   EVENING PRIMROSE OIL PO Take 1 capsule by mouth daily in the afternoon.   finasteride (PROSCAR) 5 MG tablet Take 5 mg by mouth every evening.   hyoscyamine (LEVBID) 0.375 MG 12 hr tablet Take 0.375 mg by mouth at bedtime.   ibuprofen (ADVIL,MOTRIN) 200 MG tablet Take  200-400 mg by mouth 2 (two) times daily as needed for mild pain, moderate pain or headache.   metoprolol tartrate (LOPRESSOR) 25 MG tablet Take 1 tablet (25 mg total) by mouth 2 (two) times daily. (Patient taking differently: Take 50 mg by mouth 2 (two) times daily.)   Multiple Vitamin (MULTIVITAMIN WITH MINERALS) TABS tablet Take 1 tablet by mouth daily in the afternoon. Unknown strength   omeprazole (PRILOSEC) 40 MG capsule Take 40 mg by mouth daily.   ondansetron (ZOFRAN-ODT) 4 MG disintegrating tablet Take 1 tablet (4 mg total) by mouth every 8 (eight) hours as needed for nausea or vomiting.   progesterone (PROMETRIUM) 200 MG capsule Take 1 capsule (200 mg total) by mouth daily.   rosuvastatin (CRESTOR) 5 MG tablet Take 1 tablet (5 mg total) by mouth daily.   VITAMIN E PO Take 1 capsule by mouth daily in the afternoon.   Current Facility-Administered Medications for the 08/26/23 encounter (Office Visit) with Georgeanna Lea, MD  Medication   0.9 %  sodium chloride infusion     Allergies:   Sanctura [trospium], Zithromax [azithromycin], Ditropan [oxybutynin], Nickel, Other, Sporanox [itraconazole], and Voltaren [diclofenac]   Social History   Socioeconomic History   Marital status: Married    Spouse name: Not on file   Number of children: 1   Years of education: Not on file   Highest education level: Not on file  Occupational History   Not on file  Tobacco Use   Smoking status: Former    Current packs/day: 0.00    Average packs/day: 1 pack/day for 10.0 years (10.0 ttl pk-yrs)    Types: Cigarettes    Start date: 05/19/1994    Quit date: 05/19/2004    Years since quitting: 19.2   Smokeless tobacco: Never  Vaping Use   Vaping status: Never Used  Substance and Sexual Activity   Alcohol use: No   Drug use: No   Sexual activity: Not Currently    Birth control/protection: Post-menopausal  Other Topics Concern   Not on file  Social History Narrative   Not on file   Social  Drivers of Health   Financial Resource Strain: Not on file  Food Insecurity: Not on file  Transportation Needs: Not on file  Physical Activity: Not on file  Stress: Not on file  Social Connections: Not on file     Family History: The patient's family history includes Diabetes in her mother; Heart disease in her father; Kidney cancer in her mother; Liver disease in her father. ROS:   Please see the history of present illness.    All 14 point review of systems negative except as described per history of present illness  EKGs/Labs/Other Studies Reviewed:         Recent Labs: 07/30/2023: ALT 23; BUN 18; Creatinine, Ser 0.65; Hemoglobin  12.2; Platelets 217; Potassium 3.9; Sodium 141 08/18/2023: TSH 1.810  Recent Lipid Panel    Component Value Date/Time   CHOL 177 05/16/2019 0754   TRIG 133 05/16/2019 0754   HDL 77 05/16/2019 0754   CHOLHDL 2.3 05/16/2019 0754   LDLCALC 77 05/16/2019 0754    Physical Exam:    VS:  BP (!) 166/90 (BP Location: Right Arm, Patient Position: Sitting)   Pulse 96   Ht 5\' 3"  (1.6 m)   Wt 179 lb (81.2 kg)   LMP 12/28/2010   SpO2 99%   BMI 31.71 kg/m     Wt Readings from Last 3 Encounters:  08/26/23 179 lb (81.2 kg)  08/18/23 178 lb (80.7 kg)  08/02/23 178 lb (80.7 kg)     GEN:  Well nourished, well developed in no acute distress HEENT: Normal NECK: No JVD; No carotid bruits LYMPHATICS: No lymphadenopathy CARDIAC: RRR, no murmurs, no rubs, no gallops RESPIRATORY:  Clear to auscultation without rales, wheezing or rhonchi  ABDOMEN: Soft, non-tender, non-distended MUSCULOSKELETAL:  No edema; No deformity  SKIN: Warm and dry LOWER EXTREMITIES: no swelling NEUROLOGIC:  Alert and oriented x 3 PSYCHIATRIC:  Normal affect   ASSESSMENT:    1. ASD (atrial septal defect)   2. Bicuspid aortic valve   3. Aortic root dilatation (HCC)    PLAN:    In order of problems listed above:  Atrial septal defect again will take away this diagnosis  cardiac cath did not show any intracardiac shunt. Questionable bicuspid aortic valve no evidence of significant stenosis no evidence of significant regurgitation continue monitoring. Aortic root dilatation.  40 mm previously last CT of her chest show diameter of 39 mm.  Continue monitoring. Blood pressure seems to be difficult to control she does have adrenal adenoma she did have pheochromocytoma workup done which is negative, we will schedule her to have arterial duplex evaluation of renal artery.  I will switch her from metoprolol to tartrate to metoprolol succinate to 100 mg daily I will also give him amlodipine 2.5 mg to take it on as-needed basis for high blood pressure   Medication Adjustments/Labs and Tests Ordered: Current medicines are reviewed at length with the patient today.  Concerns regarding medicines are outlined above.  No orders of the defined types were placed in this encounter.  Medication changes: No orders of the defined types were placed in this encounter.   Signed, Georgeanna Lea, MD, [facility] 08/26/2023 3:58 PM    [facility]

## 2023-08-28 ENCOUNTER — Encounter (HOSPITAL_BASED_OUTPATIENT_CLINIC_OR_DEPARTMENT_OTHER): Payer: Self-pay | Admitting: Obstetrics & Gynecology

## 2023-09-08 ENCOUNTER — Ambulatory Visit: Payer: 59 | Admitting: Internal Medicine

## 2023-09-16 ENCOUNTER — Other Ambulatory Visit: Payer: Self-pay | Admitting: Cardiology

## 2023-09-16 DIAGNOSIS — Q2381 Bicuspid aortic valve: Secondary | ICD-10-CM

## 2023-09-16 DIAGNOSIS — I10 Essential (primary) hypertension: Secondary | ICD-10-CM

## 2023-09-16 DIAGNOSIS — I701 Atherosclerosis of renal artery: Secondary | ICD-10-CM

## 2023-09-16 DIAGNOSIS — Q211 Atrial septal defect, unspecified: Secondary | ICD-10-CM

## 2023-09-16 DIAGNOSIS — I77810 Thoracic aortic ectasia: Secondary | ICD-10-CM

## 2023-09-22 ENCOUNTER — Ambulatory Visit (HOSPITAL_BASED_OUTPATIENT_CLINIC_OR_DEPARTMENT_OTHER)
Admission: RE | Admit: 2023-09-22 | Discharge: 2023-09-22 | Disposition: A | Discharge status: Home / Self Care | Payer: Self-pay | Source: Ambulatory Visit | Attending: Cardiology | Admitting: Cardiology

## 2023-09-22 DIAGNOSIS — I10 Essential (primary) hypertension: Secondary | ICD-10-CM | POA: Insufficient documentation

## 2023-09-24 ENCOUNTER — Ambulatory Visit (HOSPITAL_BASED_OUTPATIENT_CLINIC_OR_DEPARTMENT_OTHER)

## 2023-09-24 DIAGNOSIS — R0609 Other forms of dyspnea: Secondary | ICD-10-CM

## 2023-09-24 LAB — PULMONARY FUNCTION TEST
DL/VA % pred: 127 %
DL/VA: 5.45 ml/min/mmHg/L
DLCO cor % pred: 96 %
DLCO cor: 18.01 ml/min/mmHg
DLCO unc % pred: 92 %
DLCO unc: 17.31 ml/min/mmHg
FEF 25-75 Post: 2.68 L/s
FEF 25-75 Pre: 2.1 L/s
FEF2575-%Change-Post: 27 %
FEF2575-%Pred-Post: 124 %
FEF2575-%Pred-Pre: 97 %
FEV1-%Change-Post: 6 %
FEV1-%Pred-Post: 84 %
FEV1-%Pred-Pre: 78 %
FEV1-Post: 1.95 L
FEV1-Pre: 1.82 L
FEV1FVC-%Change-Post: 4 %
FEV1FVC-%Pred-Pre: 107 %
FEV6-%Change-Post: 2 %
FEV6-%Pred-Post: 77 %
FEV6-%Pred-Pre: 75 %
FEV6-Post: 2.24 L
FEV6-Pre: 2.18 L
FEV6FVC-%Pred-Post: 103 %
FEV6FVC-%Pred-Pre: 103 %
FVC-%Change-Post: 2 %
FVC-%Pred-Post: 74 %
FVC-%Pred-Pre: 72 %
FVC-Post: 2.24 L
FVC-Pre: 2.18 L
Post FEV1/FVC ratio: 87 %
Post FEV6/FVC ratio: 100 %
Pre FEV1/FVC ratio: 83 %
Pre FEV6/FVC Ratio: 100 %
RV % pred: 101 %
RV: 1.96 L
TLC % pred: 88 %
TLC: 4.19 L

## 2023-09-24 NOTE — Progress Notes (Signed)
 Full pft performed today.

## 2023-09-24 NOTE — Patient Instructions (Signed)
 Full pft performed today.

## 2023-09-25 ENCOUNTER — Ambulatory Visit (INDEPENDENT_AMBULATORY_CARE_PROVIDER_SITE_OTHER): Payer: 59 | Admitting: Internal Medicine

## 2023-09-25 ENCOUNTER — Telehealth: Payer: Self-pay | Admitting: Internal Medicine

## 2023-09-25 ENCOUNTER — Encounter: Payer: Self-pay | Admitting: Internal Medicine

## 2023-09-25 VITALS — BP 140/80 | HR 83 | Temp 98.3°F | Ht 62.0 in | Wt 177.4 lb

## 2023-09-25 DIAGNOSIS — R609 Edema, unspecified: Secondary | ICD-10-CM | POA: Diagnosis not present

## 2023-09-25 DIAGNOSIS — R0609 Other forms of dyspnea: Secondary | ICD-10-CM

## 2023-09-25 DIAGNOSIS — Z87891 Personal history of nicotine dependence: Secondary | ICD-10-CM

## 2023-09-25 LAB — BRAIN NATRIURETIC PEPTIDE: Pro B Natriuretic peptide (BNP): 25 pg/mL (ref 0.0–100.0)

## 2023-09-25 LAB — D-DIMER, QUANTITATIVE: D-Dimer, Quant: 0.22 ug{FEU}/mL (ref <0.50)

## 2023-09-25 NOTE — Progress Notes (Signed)
 OV 06/02/2023  Subjective:  Patient ID: Kristin [name], female , DOB: 27-Jun-1960 , age 63 y.o. , MRN: [id number] , ADDRESS: [address] [place] [address] PCP Ronna Coho, MD Patient Care Team: Ronna Coho, MD as PCP - General (Family Medicine)  This Provider for this visit: Treatment Team:  Attending Provider: Maire Scot, MD    06/02/2023 -   Chief Complaint  Patient presents with   Consult    Pt states she has been experiencing sob, does have allergies and has not received treatment in 5 years. Started in 2018, does have a cardiologist.No inhaler usage     HPI Kristin [name] 63 y.o. -this is a new consult for shortness of breath..  She is originally from [place].  [place] is from [name].  She is a housewife.  She says that in 2015 she started seeing Dr. Chanetta Comes Cristino Donna showed the records].  There is a diagnosis of seasonal allergies and mild intermittent asthma.  Skin test appears to have been positive for cotton-wool American birch grass Johnson grass and [place] grass.  She states she was getting allergy  shots and during this time was actually quite feeling well.  But in 2021 ran out of health insurance and then stopped taking allergy  shots.  During this time in 2018 she started noticing insidious onset of shortness of breath relieved by rest.  She had up with a cardiac evaluation and diagnosed to have ASD but without any coronary artery obstructions.  She had a clean cardiac cath according to chart review.  On the recent echo 03/20/2023 her EF is 55% and she has grade 1 diastolic dysfunction [4 years earlier she had EF 55-60% but currently 50-55%].  She tells me that gradually she is gained 12 pounds of weight in the last 5 years during this time shortness of breath with exertion is also worse.  Is particular worse with stairs and also putting on her shoes when she bends over.  She is gaining weight.  Her BMI is high.   She does have some chest heaviness that is related to acid reflux she is upcoming endoscopy/GI appointment.  There is barely any cough and there is no wheezing.  Of note she is a former smoker having smoked 1 pack of cigarettes a day starting at age 66 and quitting in 41 at age 77.  Therefore total 22 pack smoking history.  And quit 20 years ago in 2005.  She has had several CT scans some of the chest but mostly abdomen.  The most recent CT chest heart was the end of 2020 for that I personally visualized detailed below.  She does have some postinflammatory atelectasis particularly in the left lower lobe and also some of the right lower lobe.  These are stable in my view  Narrative & Impression  CLINICAL DATA:  Aortic aneurysm suspected hx of dilated aortic root and now with new chest pain r/o dissection or aneurysm   EXAM: CT ANGIOGRAPHY CHEST, ABDOMEN AND PELVIS   TECHNIQUE: Non-contrast CT of the chest was initially obtained.   Multidetector CT imaging through the chest, abdomen and pelvis was performed using the standard protocol during bolus administration of intravenous contrast. Multiplanar reconstructed images and MIPs were obtained and reviewed to evaluate the vascular anatomy.   RADIATION DOSE REDUCTION: This exam was performed according to the departmental dose-optimization program which includes automated exposure control, adjustment of the mA and/or  kV according to patient size and/or use of iterative reconstruction technique.   CONTRAST:  OMNIPAQUE  IOHEXOL  350 MG/ML SOLN   COMPARISON:  CT scan abdomen and pelvis from 01/23/2018.   FINDINGS: CTA CHEST FINDINGS   Cardiovascular: No intramural hematoma noted in the thoracic aorta on the unenhanced images.   Thoracic aorta is normal in caliber without aneurysm, dissection, vasculitis or significant stenosis. Even though the ascending aortic measurements are amenable to cardiac pulsation artifact as  this examination was performed without cardiac gating; however, ascending aorta measured up to 3.9 cm in diameter. Aortic root measurements are not obtained due to extensive motion.   Normal cardiac size. No pericardial effusion.   Mediastinum/Nodes: Visualized thyroid  gland appears grossly unremarkable. No solid / cystic mediastinal masses. The esophagus is nondistended precluding optimal assessment. No axillary, mediastinal or hilar lymphadenopathy by size criteria.   Lungs/Pleura: The central tracheo-bronchial tree is patent. There are patchy areas of linear, plate-like atelectasis and/or scarring throughout bilateral lungs. No mass or consolidation. No pleural effusion or pneumothorax. No suspicious lung nodules.   Musculoskeletal: The visualized soft tissues of the chest wall are grossly unremarkable. No suspicious osseous lesions. There are mild multilevel degenerative changes in the visualized spine.   Review of the MIP images confirms the above findings.   CTA ABDOMEN AND PELVIS FINDINGS   VASCULAR   Aorta: Normal caliber aorta without aneurysm, dissection, vasculitis or significant stenosis.   Celiac: Patent without evidence of aneurysm, dissection, vasculitis or significant stenosis.   SMA: Patent without evidence of aneurysm, dissection, vasculitis or significant stenosis.   Renals: Both renal arteries are patent without evidence of aneurysm, dissection, vasculitis, fibromuscular dysplasia or significant stenosis.   IMA: Patent without evidence of aneurysm, dissection, vasculitis or significant stenosis.   Inflow: Patent without evidence of aneurysm, dissection, vasculitis or significant stenosis.   Veins: No obvious venous abnormality within the limitations of this arterial phase study.   Review of the MIP images confirms the above findings.   NON-VASCULAR   Hepatobiliary: The liver is normal in size. Non-cirrhotic configuration. No suspicious mass.  No intrahepatic bile duct dilation. There is mild prominence of the extrahepatic bile duct, most likely due to post cholecystectomy status. Gallbladder is surgically absent.   Pancreas: Unremarkable. No pancreatic ductal dilatation or surrounding inflammatory changes.   Spleen: Within normal limits. No focal lesion.   Adrenals/Urinary Tract: Adrenal glands are unremarkable. No suspicious renal mass. Multiple sinus cysts noted in bilateral kidneys. No hydronephrosis. No renal or ureteric calculi. Unremarkable urinary bladder.   Stomach/Bowel: No disproportionate dilation of the small or large bowel loops. No evidence of abnormal bowel wall thickening or inflammatory changes. The appendix is unremarkable. There are multiple diverticula , without imaging signs of diverticulitis.   Vascular/Lymphatic: No ascites or pneumoperitoneum. No abdominal or pelvic lymphadenopathy, by size criteria. No aneurysmal dilation of the major abdominal arteries.   Reproductive: The uterus is unremarkable. No large adnexal mass.   Other: There is a tiny fat containing umbilical hernia. The soft tissues and abdominal wall are otherwise unremarkable.   Musculoskeletal: No suspicious osseous lesions. There are mild multilevel degenerative changes in the visualized spine.   Review of the MIP images confirms the above findings.   IMPRESSION: 1. No acute findings in the chest, abdomen, or pelvis. Specifically, no evidence of aortic dissection or aneurysm. 2. Colonic diverticulosis without imaging signs of diverticulitis. 3. Multiple other nonacute observations, as described above.     Electronically Signed   By:  Beula Brunswick M.D.   On: 02/27/2023 17:04       OV 09/25/2023  Subjective:  Patient ID: Kristin [name], female , DOB: June 15, 1960 , age 35 y.o. , MRN: [id number] , ADDRESS: [address] [place] [address] PCP Ronna Coho, MD Patient Care Team: Ronna Coho, MD as PCP  - General (Family Medicine)  This Provider for this visit: Treatment Team:  Attending Provider: Maire Scot, MD    09/25/2023 -   Chief Complaint  Patient presents with   Follow-up    F/u PFT     HPI Kristin [name] 63 y.o. -returns for dyspnea follow-up.  Since her last visit even in January 2020 for she had COVID.  She says she had pneumonia but there is no chest x-ray for me to visualize.  Then she ended up with a headache and blood pressure issues in March 2020 for in the ER and this is chest x-ray there that I reviewed and it is normal and clear.  She says that she did try the Trelegy sample I gave her but it did not improve her dyspnea at all.  It still persists.  She is definitely not worse.  In addition for the last 2-week she is complaining of bilateral pedal edema that seems more dependent towards the end of the day.  There is no varicosities on exam.  In fact I did not appreciate the edema as much.  There is no local warmth.  She has upcoming cardiology appointment.  She had pulmonary function test today normal total lung capacity normal diffusion.  FVC slightly reduced but this is associated with obesity.      Simple office walk 224 (66+46 x 2) feet Pod A at [facility] x  3 laps goal with forehead probe 06/02/2023    O2 used ra   Number laps completed Sit stand x 15   Comments about pace x   Resting Pulse Ox/HR 95% and 80/min   Final Pulse Ox/HR 95% and 86/min   Desaturated </= 88% x   Desaturated <= 3% points x   Got Tachycardic >/= 90/min xxx   Symptoms at end of test x   Miscellaneous comments x       PFT     Latest Ref Rng & Units 09/24/2023    8:09 AM  PFT Results  FVC-Pre L 2.18  P  FVC-Predicted Pre % 72  P  FVC-Post L 2.24  P  FVC-Predicted Post % 74  P  Pre FEV1/FVC % % 83  P  Post FEV1/FCV % % 87  P  FEV1-Pre L 1.82  P  FEV1-Predicted Pre % 78  P  FEV1-Post L 1.95  P  DLCO uncorrected ml/min/mmHg 17.31  P  DLCO UNC% % 92  P  DLCO corrected  ml/min/mmHg 18.01  P  DLCO COR %Predicted % 96  P  DLVA Predicted % 127  P  TLC L 4.19  P  TLC % Predicted % 88  P  RV % Predicted % 101  P    P Preliminary result       LAB RESULTS last 96 hours VAS US  RENAL ARTERY DUPLEX Result Date: 09/22/2023 ABDOMINAL VISCERAL Patient Name:  Aarna Battaglia  Date of Exam:   09/22/2023 Medical Rec #: [id number]         Accession #:    [id number] Date of Birth: [birth year]         Patient Gender: F Patient Age:   85  years Exam Location:  [place] [facility] Procedure:      VAS US  RENAL ARTERY DUPLEX Referring Phys: [id number] ROBERT J KRASOWSKI -------------------------------------------------------------------------------- Indications: Hypertension High Risk Factors: Hypertension, hyperlipidemia, past history of smoking. Limitations: Air/bowel gas. Performing Technologist: Lyndal Sandy RDMS, RVT, RDCS  Examination Guidelines: A complete evaluation includes B-mode imaging, spectral Doppler, color Doppler, and power Doppler as needed of all accessible portions of each vessel. Bilateral testing is considered an integral part of a complete examination. Limited examinations for reoccurring indications may be performed as noted.  Duplex Findings: +--------------------+--------+--------+------+------------+ Mesenteric          PSV cm/sEDV cm/sPlaque  Comments   +--------------------+--------+--------+------+------------+ Aorta Prox             83                 2.3 x 2.3 cm +--------------------+--------+--------+------+------------+ Aorta Mid              74                 1.9 x 2.0 cm +--------------------+--------+--------+------+------------+ Aorta Distal           68                 1.6 x 1.4 cm +--------------------+--------+--------+------+------------+ Celiac Artery Origin  148                              +--------------------+--------+--------+------+------------+ SMA Proximal           78      15                       +--------------------+--------+--------+------+------------+    +------------------+--------+--------+-------+ Right Renal ArteryPSV cm/sEDV cm/sComment +------------------+--------+--------+-------+ Origin              126      28           +------------------+--------+--------+-------+ Proximal            136      47           +------------------+--------+--------+-------+ Mid                  96      35           +------------------+--------+--------+-------+ Distal               64      23           +------------------+--------+--------+-------+ +-----------------+--------+--------+-------+ Left Renal ArteryPSV cm/sEDV cm/sComment +-----------------+--------+--------+-------+ Origin              92      23           +-----------------+--------+--------+-------+ Proximal            56      20           +-----------------+--------+--------+-------+ Mid                121      45           +-----------------+--------+--------+-------+ Distal             109      40           +-----------------+--------+--------+-------+ +------------+--------+--------+----+-----------+--------+--------+----+ Right KidneyPSV cm/sEDV cm/sRI  Left KidneyPSV cm/sEDV cm/sRI   +------------+--------+--------+----+-----------+--------+--------+----+ Upper Pole  27      11  0.59Upper Pole 37      13      0.65 +------------+--------+--------+----+-----------+--------+--------+----+ Mid         29      10      0.        23      8       0.65 +------------+--------+--------+----+-----------+--------+--------+----+ Lower Pole  31      12      0.61Lower Pole 36      11      0.69 +------------+--------+--------+----+-----------+--------+--------+----+ Hilar       55      20      0.64Hilar      53      18      0.66 +------------+--------+--------+----+-----------+--------+--------+----+ +------------------+-------+------------------+--------+  Right Kidney             Left Kidney                +------------------+-------+------------------+--------+ RAR                      RAR                        +------------------+-------+------------------+--------+ RAR (manual)      1.64   RAR (manual)      1.46     +------------------+-------+------------------+--------+ Cortex                   Cortex                     +------------------+-------+------------------+--------+ Cortex thickness  9.00 mmCorex thickness   10.00 mm +------------------+-------+------------------+--------+ Kidney length (cm)11.50  Kidney length (cm)11.60    +------------------+-------+------------------+--------+  Summary: Largest Aortic Diameter: 2.3 cm  Renal:  Right: Normal size right kidney. Normal right Resisitive Index.        Normal cortical thickness of right kidney. No evidence of        right renal artery stenosis. RRV flow present. Left:  Normal size of left kidney. Normal left Resistive Index.        Normal cortical thickness of the left kidney. No evidence of        left renal artery stenosis. LRV flow present. Mesenteric: Normal Celiac artery and Superior Mesenteric artery findings. IVC is patent.  *See table(s) above for measurements and observations.  Diagnosing physician: Ralene Burger MD  Electronically signed by Ralene Burger MD on 09/22/2023 at 8:04:42 PM.    Final          has a past medical history of Abdominal pain (01/28/2021), Allergy , Alopecia (01/28/2021), Aortic root dilatation (HCC) (06/03/2021), ASD (atrial septal defect) (03/16/2020), Atrial septal defect, Atypical chest pain (03/20/2021), Bicuspid aortic valve (03/16/2020), Dyspnea, Dyspnea on exertion (02/23/2012), Elevated blood-pressure reading, without diagnosis of hypertension (01/28/2021), Endometriosis, High cholesterol (10/16/2020), History of basal cell carcinoma (BCC) (01/16/2023), Hyperlipidemia, Hypertension, IBS (irritable bowel syndrome), Increased  frequency of urination (07/24/2014), Intramural uterine fibroid (01/29/2019), Lichen planus (01/29/2021), Loss of hair, Pain, abdominal, Right lower quadrant pain (04/16/2021), Seasonal allergic rhinitis (10/16/2020), Skin cancer, basal cell, Stress (01/28/2021), and Vitamin D deficiency.   reports that she quit smoking about 19 years ago. Her smoking use included cigarettes. She started smoking about 29 years ago. She has a 10 pack-year smoking history. She has never used smokeless tobacco.  Past Surgical History:  Procedure Laterality Date   CESAREAN SECTION  1983   CHOLECYSTECTOMY  1990   In [place]  CYSTOURETHROSCOPY  2016   HYSTEROSCOPY  2015   LAPAROSCOPY  03/12/2016   with resection of pelvic adhesions   LAPAROTOMY  1990   post operative infection from cholecystectomy   OOPHORECTOMY Left 2000   RIGHT/LEFT HEART CATH AND CORONARY ANGIOGRAPHY N/A 04/23/2021   Procedure: RIGHT/LEFT HEART CATH AND CORONARY ANGIOGRAPHY;  Surgeon: Arty Binning, MD;  Location: [facility];  Service: Cardiovascular;  Laterality: N/A;   SKIN CANCER EXCISION     facial    Allergies  Allergen Reactions   Sanctura [Trospium] Shortness Of Breath, Itching and Rash   Zithromax [Azithromycin] Itching   Ditropan [Oxybutynin] Other (See Comments)    Vision changes   Nickel Other (See Comments)    Per allergy  test   Other Other (See Comments)    allergies to mold, cats, dogs, trees, and grass - causes nasal congestion   Sporanox [Itraconazole] Rash    08/24/23- pt called to clarify allergy ; pt states this was many years ago and she does not remember   Voltaren [Diclofenac] Rash    Immunization History  Administered Date(s) Administered   Influenza,inj,Quad PF,6+ Mos 02/03/2018, 01/02/2019   Influenza-Unspecified 02/04/2021, 02/20/2022, 01/18/2023   PFIZER(Purple Top)SARS-COV-2 Vaccination 07/30/2019, 08/30/2019   Tdap 04/20/2015   Zoster Recombinant(Shingrix) 01/02/2019, 03/04/2019    Family  History  Problem Relation Age of Onset   Heart disease Father    Liver disease Father    Diabetes Mother    Kidney cancer Mother      Current Outpatient Medications:    amLODipine  (NORVASC ) 2.5 MG tablet, Take 1 tablet (2.5 mg total) by mouth daily as needed. For blood pressure above 150/100, Disp: 90 tablet, Rfl: 3   aspirin  EC 81 MG tablet, Take 1 tablet (81 mg total) by mouth daily. Swallow whole., Disp: 90 tablet, Rfl: 3   BIOTIN PO, Take 1 tablet by mouth daily in the afternoon., Disp: , Rfl:    CALCIUM  PO, Take 1 tablet by mouth daily in the afternoon., Disp: , Rfl:    cetirizine (ZYRTEC) 10 MG tablet, Take 10 mg by mouth daily as needed for allergies., Disp: , Rfl:    Cholecalciferol (VITAMIN D-3 PO), Take 1 capsule by mouth daily in the afternoon., Disp: , Rfl:    clobetasol (TEMOVATE) 0.05 % external solution, Apply 1 application  topically 2 (two) times daily as needed (scalp irritation)., Disp: , Rfl: 0   Coenzyme Q10 (COQ10 PO), Take 1 capsule by mouth daily in the afternoon., Disp: , Rfl:    estradiol  (ESTRACE ) 0.5 MG tablet, Take 1 tablet (0.5 mg total) by mouth daily., Disp: 90 tablet, Rfl: 3   EVENING PRIMROSE OIL PO, Take 1 capsule by mouth daily in the afternoon., Disp: , Rfl:    finasteride (PROSCAR) 5 MG tablet, Take 5 mg by mouth every evening., Disp: , Rfl:    hyoscyamine  (LEVBID) 0.375 MG 12 hr tablet, Take 0.375 mg by mouth at bedtime., Disp: , Rfl: 0   ibuprofen  (ADVIL ,MOTRIN ) 200 MG tablet, Take 200-400 mg by mouth 2 (two) times daily as needed for mild pain, moderate pain or headache., Disp: , Rfl:    metoprolol  succinate (TOPROL -XL) 100 MG 24 hr tablet, Take 1 tablet (100 mg total) by mouth daily. Take with or immediately following a meal., Disp: 90 tablet, Rfl: 3   Multiple Vitamin (MULTIVITAMIN WITH MINERALS) TABS tablet, Take 1 tablet by mouth daily in the afternoon. Unknown strength, Disp: , Rfl:    omeprazole (PRILOSEC) 40  MG capsule, Take 40 mg by mouth  daily., Disp: , Rfl:    progesterone  (PROMETRIUM ) 200 MG capsule, Take 1 capsule (200 mg total) by mouth daily., Disp: 90 capsule, Rfl: 3   rosuvastatin  (CRESTOR ) 5 MG tablet, Take 1 tablet (5 mg total) by mouth daily., Disp: 90 tablet, Rfl: 2   VITAMIN E PO, Take 1 capsule by mouth daily in the afternoon., Disp: , Rfl:   Current Facility-Administered Medications:    0.9 %  sodium chloride  infusion, 500 mL, Intravenous, Continuous, Asencion Blacksmith, MD      Objective:   Vitals:   09/25/23 0949  BP: (!) 140/80  Pulse: 83  Temp: 98.3 F (36.8 C)  TempSrc: Oral  SpO2: 99%  Weight: 177 lb 6.4 oz (80.5 kg)  Height: 5\' 2"  (1.575 m)    Estimated body mass index is 32.45 kg/m as calculated from the following:   Height as of this encounter: 5\' 2"  (1.575 m).   Weight as of this encounter: 177 lb 6.4 oz (80.5 kg).  @WEIGHTCHANGE @  [employer]   09/25/23 0949  Weight: 177 lb 6.4 oz (80.5 kg)     Physical Exam   General: No distress. Looks well O2 at rest: no Cane present: no Sitting in wheel chair: no Frail: no Obese: no Neuro: Alert and Oriented x 3. GCS 15. Speech normal Psych: Pleasant Resp:  Barrel Chest - no.  Wheeze - no, Crackles - no, No overt respiratory distress CVS: Normal heart sounds. Murmurs - n Ext: Stigmata of Connective Tissue Disease - no HEENT: Normal upper airway. PEERL +. No post nasal drip        Assessment:       ICD-10-CM   1. DOE (dyspnea on exertion)  R06.09 B Nat Peptide    D-Dimer, Quantitative    Cardiopulmonary exercise test    2. Stopped smoking with greater than 20 pack year history  Z87.891 B Nat Peptide    D-Dimer, Quantitative    Cardiopulmonary exercise test    3. Edema, unspecified type  R60.9 B Nat Peptide    D-Dimer, Quantitative    Cardiopulmonary exercise test         Plan:     Patient Instructions     ICD-10-CM   1. DOE (dyspnea on exertion)  R06.09     2. Stopped smoking with greater than 20 pack year  history  Z87.891     3. Edema, unspecified type  R60.9        No improvement with TRELEGY Shortness of breath very likely related to slightly stiff heart muscle [called grade 1 diastolic dysfunction] and slightly low ejection fraction [55%] and also weight gain.      There are old scar tissue abnormalities on his CT scan of the chest there is stable and several CT scans between 20 17 through 20 24.  I suspect this is from previous pneumonia or infection.  We call this "postinflammatory atelectasis"/this is.  This is very mild and this is not causing her shortness of breath.  I also do not expect this to get worse and CXR March 2025 is normal  New complaint on 09/25/2023 edema for 2 weeks but I do not appreciated on exam  Plan - Check blood D-dimer and BNP if these are abnormal you need a special CT scan of the chest -Referral for cardiopulmonary stress testing with VO2 max for dyspnea evaluation   Follow - 15-minute visit note practitioner after pulmonary stress test  in 3 months   FOLLOWUP Return in about 3 months (around 12/26/2023) for with any of the APPS, Face to Face Visit.    SIGNATURE    Dr. Maire Scot, M.D., F.C.C.P,  Pulmonary and Critical Care Medicine Staff Physician, [facility] Director - Interstitial Lung Disease  Program  Pulmonary Fibrosis [facility] at [facility] [place], [place], [address]  Pager: [phone], If no answer or between  15:00h - 7:00h: call [phone] Telephone: [phone]  10:24 AM 09/25/2023

## 2023-09-25 NOTE — Telephone Encounter (Signed)
 Dr. Ardeth Beckers ordered a cardio test for her. Her deductible is met and she would like this test sched before the end of June please. In July her Ins changes. Thanks.

## 2023-09-25 NOTE — Patient Instructions (Addendum)
 ICD-10-CM   1. DOE (dyspnea on exertion)  R06.09     2. Stopped smoking with greater than 20 pack year history  Z87.891     3. Edema, unspecified type  R60.9        No improvement with TRELEGY Shortness of breath very likely related to slightly stiff heart muscle [called grade 1 diastolic dysfunction] and slightly low ejection fraction [55%] and also weight gain.      There are old scar tissue abnormalities on his CT scan of the chest there is stable and several CT scans between 20 17 through 20 24.  I suspect this is from previous pneumonia or infection.  We call this "postinflammatory atelectasis"/this is.  This is very mild and this is not causing her shortness of breath.  I also do not expect this to get worse and CXR March 2025 is normal  New complaint on 09/25/2023 edema for 2 weeks but I do not appreciated on exam  Plan - Check blood D-dimer and BNP if these are abnormal you need a special CT scan of the chest -Referral for cardiopulmonary stress testing with VO2 max for dyspnea evaluation   Follow - 15-minute visit note practitioner after pulmonary stress test in 3 months

## 2023-09-28 ENCOUNTER — Encounter: Payer: Self-pay | Admitting: Internal Medicine

## 2023-09-28 NOTE — Progress Notes (Signed)
Blood test are normal

## 2023-09-29 ENCOUNTER — Ambulatory Visit: Payer: Self-pay

## 2023-09-29 ENCOUNTER — Encounter: Payer: Self-pay | Admitting: Cardiology

## 2023-09-29 ENCOUNTER — Ambulatory Visit: Attending: Cardiology | Admitting: Cardiology

## 2023-09-29 VITALS — BP 126/80 | HR 72 | Ht 62.0 in | Wt 177.0 lb

## 2023-09-29 DIAGNOSIS — R0789 Other chest pain: Secondary | ICD-10-CM

## 2023-09-29 DIAGNOSIS — I10 Essential (primary) hypertension: Secondary | ICD-10-CM

## 2023-09-29 DIAGNOSIS — R0609 Other forms of dyspnea: Secondary | ICD-10-CM

## 2023-09-29 DIAGNOSIS — E7800 Pure hypercholesterolemia, unspecified: Secondary | ICD-10-CM

## 2023-09-29 DIAGNOSIS — Q2381 Bicuspid aortic valve: Secondary | ICD-10-CM

## 2023-09-29 NOTE — Patient Instructions (Addendum)
 Medication Instructions:  Your physician recommends that you continue on your current medications as directed. Please refer to the Current Medication list given to you today.  *If you need a refill on your cardiac medications before your next appointment, please call your pharmacy*  Lab Work: None If you have labs (blood work) drawn today and your tests are completely normal, you will receive your results only by: MyChart Message (if you have MyChart) OR A paper copy in the mail If you have any lab test that is abnormal or we need to change your treatment, we will call you to review the results.  Testing/Procedures: Your physician has requested that you have an echocardiogram. Echocardiography is a painless test that uses sound waves to create images of your heart. It provides your doctor with information about the size and shape of your heart and how well your heart's chambers and valves are working. This procedure takes approximately one hour. There are no restrictions for this procedure. Please do NOT wear cologne, perfume, aftershave, or lotions (deodorant is allowed). Please arrive 15 minutes prior to your appointment time.  Please note: We ask at that you not bring children with you during ultrasound (echo/ vascular) testing. Due to room size and safety concerns, children are not allowed in the ultrasound rooms during exams. Our front office staff cannot provide observation of children in our lobby area while testing is being conducted. An adult accompanying a patient to their appointment will only be allowed in the ultrasound room at the discretion of the ultrasound technician under special circumstances. We apologize for any inconvenience.   Follow-Up: At [facility], you and your health needs are our priority.  As part of our continuing mission to provide you with exceptional heart care, our providers are all part of one team.  This team includes your primary Cardiologist  (physician) and Advanced Practice Providers or APPs (Physician Assistants and Nurse Practitioners) who all work together to provide you with the care you need, when you need it.  Your next appointment:   3 month(s)  Provider:   Ralene Burger, MD    We recommend signing up for the patient portal called "MyChart".  Sign up information is provided on this After Visit Summary.  MyChart is used to connect with patients for Virtual Visits (Telemedicine).  Patients are able to view lab/test results, encounter notes, upcoming appointments, etc.  Non-urgent messages can be sent to your provider as well.   To learn more about what you can do with MyChart, go to [email or web address].   Other Instructions None

## 2023-09-29 NOTE — Progress Notes (Signed)
 Cardiology Office Note:    Date:  09/29/2023   ID:  Kristin [name], DOB 1960-05-25, MRN [id number]  PCP:  Ronna Coho, MD  Cardiologist:  Ralene Burger, MD    Referring MD: Ronna Coho, MD   Chief Complaint  Patient presents with   Follow-up    History of Present Illness:    Kristin [name] is a 63 y.o. female past medical history significant for questionable bicuspid aortic valve mild enlargement of the aortic root measuring 39 to 40 mm, essential hypertension which was somewhat difficult to control there was some question about potentially atrial septal defect cardiac catheterization was done which showed no evidence of intracardiac shunt.  Comes today to months for follow-up as time I seen him he was difficulty controlling blood pressure medication has been adjusted now it looks pretty good.  She did have renal ultrasounds which was negative.  New concern is about swelling of lower extremities.  proBNP has been done by pulmonologist which is normal 25, D-dimer was also negative.  Denies have any chest pain tightness squeezing pressure burning chest, she is upset with herself because she gained 10 pounds.  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   Atypical chest pain 03/20/2021   Bicuspid aortic valve 03/16/2020   Dyspnea    Dyspnea on exertion 02/23/2012   Followed in Pulmonary clinic/ [facility]/ [place]    - 02/23/2012  Walked RA x 3 laps @ 185 ft each stopped due to  End of study, no tachypnea, tachycardia or desat     Elevated blood-pressure reading, without diagnosis of hypertension 01/28/2021   Endometriosis    High cholesterol 10/16/2020    History of basal cell carcinoma (BCC) 01/16/2023   Hyperlipidemia    Hypertension    IBS (irritable bowel syndrome)    Increased frequency of urination 07/24/2014   Intramural uterine fibroid 01/29/2019   Lichen planus 01/29/2021   Loss of hair    Pain, abdominal    Right lower quadrant pain 04/16/2021   Seasonal allergic rhinitis 10/16/2020   Skin cancer, basal cell    Stress 01/28/2021   Vitamin D deficiency     Past Surgical History:  Procedure Laterality Date   CESAREAN SECTION  1983   CHOLECYSTECTOMY  1990   In [place]   CYSTOURETHROSCOPY  2016   HYSTEROSCOPY  2015   LAPAROSCOPY  03/12/2016   with resection of pelvic adhesions   LAPAROTOMY  1990   post operative infection from cholecystectomy   OOPHORECTOMY Left 2000   RIGHT/LEFT HEART CATH AND CORONARY ANGIOGRAPHY N/A 04/23/2021   Procedure: RIGHT/LEFT HEART CATH AND CORONARY ANGIOGRAPHY;  Surgeon: Arty Binning, MD;  Location: [facility];  Service: Cardiovascular;  Laterality: N/A;   SKIN CANCER EXCISION     facial    Current Medications: Current Meds  Medication Sig   amLODipine  (NORVASC ) 2.5 MG tablet Take 1 tablet (2.5 mg total) by mouth daily as needed. For blood pressure above 150/100 (Patient taking differently: Take 2.5 mg by mouth daily as needed (see below). For blood pressure above 150/100)   aspirin  EC 81 MG tablet Take 1 tablet (81  mg total) by mouth daily. Swallow whole.   BIOTIN PO Take 1 tablet by mouth daily in the afternoon.   CALCIUM  PO Take 1 tablet by mouth daily in the afternoon.   cetirizine (ZYRTEC) 10 MG tablet Take 10 mg by mouth daily as needed for allergies.   Cholecalciferol (VITAMIN D-3 PO) Take 1 capsule by mouth daily in the afternoon.   clobetasol (TEMOVATE) 0.05 % external solution Apply 1 application  topically 2 (two) times daily as needed (scalp irritation).   Coenzyme Q10 (COQ10 PO) Take 1 capsule by mouth daily in the afternoon.   estradiol  (ESTRACE ) 0.5 MG tablet Take  1 tablet (0.5 mg total) by mouth daily.   EVENING PRIMROSE OIL PO Take 1 capsule by mouth daily in the afternoon.   finasteride (PROSCAR) 5 MG tablet Take 5 mg by mouth every evening.   hyoscyamine  (LEVBID) 0.375 MG 12 hr tablet Take 0.375 mg by mouth at bedtime.   ibuprofen  (ADVIL ,MOTRIN ) 200 MG tablet Take 200-400 mg by mouth 2 (two) times daily as needed for mild pain, moderate pain or headache.   metoprolol  succinate (TOPROL -XL) 100 MG 24 hr tablet Take 1 tablet (100 mg total) by mouth daily. Take with or immediately following a meal.   Multiple Vitamin (MULTIVITAMIN WITH MINERALS) TABS tablet Take 1 tablet by mouth daily in the afternoon. Unknown strength   omeprazole (PRILOSEC) 40 MG capsule Take 40 mg by mouth daily.   progesterone  (PROMETRIUM ) 200 MG capsule Take 1 capsule (200 mg total) by mouth daily.   rosuvastatin  (CRESTOR ) 5 MG tablet Take 1 tablet (5 mg total) by mouth daily.   VITAMIN E PO Take 1 capsule by mouth daily in the afternoon.   Current Facility-Administered Medications for the 09/29/23 encounter (Office Visit) with Manfred Seed, MD  Medication   0.9 %  sodium chloride  infusion     Allergies:   Sanctura [trospium], Zithromax [azithromycin], Ditropan [oxybutynin], Nickel, Other, Sporanox [itraconazole], and Voltaren [diclofenac]   Social History   Socioeconomic History   Marital status: Married    Spouse name: Not on file   Number of children: 1   Years of education: Not on file   Highest education level: Not on file  Occupational History   Not on file  Tobacco Use   Smoking status: Former    Current packs/day: 0.00    Average packs/day: 1 pack/day for 10.0 years (10.0 ttl pk-yrs)    Types: Cigarettes    Start date: 05/19/1994    Quit date: 05/19/2004    Years since quitting: 19.3   Smokeless tobacco: Never  Vaping Use   Vaping status: Never Used  Substance and Sexual Activity   Alcohol use: No   Drug use: No   Sexual activity: Not Currently     Birth control/protection: Post-menopausal  Other Topics Concern   Not on file  Social History Narrative   Not on file   Social Drivers of Health   Financial Resource Strain: Not on file  Food Insecurity: Not on file  Transportation Needs: Not on file  Physical Activity: Not on file  Stress: Not on file  Social Connections: Not on file     Family History: The patient's family history includes Diabetes in her mother; Heart disease in her father; Kidney cancer in her mother; Liver disease in her father. ROS:   Please see the history of present illness.    All 14 point review of systems negative except as described per history of  present illness  EKGs/Labs/Other Studies Reviewed:         Recent Labs: 07/30/2023: ALT 23; BUN 18; Creatinine, Ser 0.65; Hemoglobin 12.2; Platelets 217; Potassium 3.9; Sodium 141 08/18/2023: TSH 1.810 09/25/2023: Pro B Natriuretic peptide (BNP) 25.0  Recent Lipid Panel    Component Value Date/Time   CHOL 177 05/16/2019 0754   TRIG 133 05/16/2019 0754   HDL 77 05/16/2019 0754   CHOLHDL 2.3 05/16/2019 0754   LDLCALC 77 05/16/2019 0754    Physical Exam:    VS:  BP 126/80 (BP Location: Left Arm, Patient Position: Sitting)   Pulse 72   Ht 5\' 2"  (1.575 m)   Wt 177 lb (80.3 kg)   LMP 12/28/2010   SpO2 94%   BMI 32.37 kg/m     Wt Readings from Last 3 Encounters:  09/29/23 177 lb (80.3 kg)  09/25/23 177 lb 6.4 oz (80.5 kg)  08/26/23 179 lb (81.2 kg)     GEN:  Well nourished, well developed in no acute distress HEENT: Normal NECK: No JVD; No carotid bruits LYMPHATICS: No lymphadenopathy CARDIAC: RRR, soft systolic murmur grade 1/6 to 2/6 best at right upper portion of the sternum, no rubs, no gallops RESPIRATORY:  Clear to auscultation without rales, wheezing or rhonchi  ABDOMEN: Soft, non-tender, non-distended MUSCULOSKELETAL:  No edema; No deformity  SKIN: Warm and dry LOWER EXTREMITIES: no swelling NEUROLOGIC:  Alert and oriented x  3 PSYCHIATRIC:  Normal affect   ASSESSMENT:    1. Bicuspid aortic valve   2. Primary hypertension   3. Dyspnea on exertion   4. Atypical chest pain   5. High cholesterol    PLAN:    In order of problems listed above:  Questionable bicuspid aortic valve, echocardiogram will repeated especially now since she does have some more shortness of breath before and also some pitting edema which is only minimal if at all present today during the exam but she said that evening time usually worse.  So far proBNP negative. Essential hypertension finally seems to be well-controlled continue present management. Atypical chest pain denies having any. Dyspnea on exertion she is scheduled to have cardiopulmonary stress test by pulmonologist will wait for results of it.  I suspect portion of it could be deconditioning. Dyslipidemia continue K PN LDL 81 HDL 58 good control continue present management   Medication Adjustments/Labs and Tests Ordered: Current medicines are reviewed at length with the patient today.  Concerns regarding medicines are outlined above.  No orders of the defined types were placed in this encounter.  Medication changes: No orders of the defined types were placed in this encounter.   Signed, Manfred Seed, MD, [facility] 09/29/2023 8:26 AM    [facility]

## 2023-09-29 NOTE — Addendum Note (Signed)
 Addended by: Aurelio Leer I on: 09/29/2023 08:36 AM   Modules accepted: Orders

## 2023-10-01 ENCOUNTER — Other Ambulatory Visit (HOSPITAL_BASED_OUTPATIENT_CLINIC_OR_DEPARTMENT_OTHER): Payer: Self-pay | Admitting: Certified Nurse Midwife

## 2023-10-01 DIAGNOSIS — Z780 Asymptomatic menopausal state: Secondary | ICD-10-CM

## 2023-10-01 NOTE — Progress Notes (Signed)
 Per Almira Armour, pt at front desk requesting order for Bone Density. Last Bone Density in 2020 (5 years ago). Order placed for Bone Density. Yolanda Hence

## 2023-10-07 ENCOUNTER — Ambulatory Visit (HOSPITAL_BASED_OUTPATIENT_CLINIC_OR_DEPARTMENT_OTHER)
Admission: RE | Admit: 2023-10-07 | Discharge: 2023-10-07 | Disposition: A | Discharge status: Home / Self Care | Source: Ambulatory Visit | Attending: Certified Nurse Midwife | Admitting: Certified Nurse Midwife

## 2023-10-07 DIAGNOSIS — Z780 Asymptomatic menopausal state: Secondary | ICD-10-CM | POA: Insufficient documentation

## 2023-10-08 ENCOUNTER — Other Ambulatory Visit: Payer: Self-pay | Admitting: Cardiology

## 2023-10-08 DIAGNOSIS — R0609 Other forms of dyspnea: Secondary | ICD-10-CM

## 2023-10-08 DIAGNOSIS — R0789 Other chest pain: Secondary | ICD-10-CM

## 2023-10-08 DIAGNOSIS — E7800 Pure hypercholesterolemia, unspecified: Secondary | ICD-10-CM

## 2023-10-08 DIAGNOSIS — Q2381 Bicuspid aortic valve: Secondary | ICD-10-CM

## 2023-10-08 DIAGNOSIS — I10 Essential (primary) hypertension: Secondary | ICD-10-CM

## 2023-10-19 ENCOUNTER — Ambulatory Visit (HOSPITAL_BASED_OUTPATIENT_CLINIC_OR_DEPARTMENT_OTHER): Payer: Self-pay | Admitting: Obstetrics & Gynecology

## 2023-10-27 ENCOUNTER — Ambulatory Visit (HOSPITAL_BASED_OUTPATIENT_CLINIC_OR_DEPARTMENT_OTHER)
Admission: RE | Admit: 2023-10-27 | Discharge: 2023-10-27 | Disposition: A | Discharge status: Home / Self Care | Source: Ambulatory Visit | Attending: Cardiology | Admitting: Cardiology

## 2023-10-27 DIAGNOSIS — R0609 Other forms of dyspnea: Secondary | ICD-10-CM | POA: Diagnosis not present

## 2023-10-27 DIAGNOSIS — R0789 Other chest pain: Secondary | ICD-10-CM | POA: Insufficient documentation

## 2023-10-27 LAB — ECHOCARDIOGRAM COMPLETE
AR max vel: 1.84 cm2
AV Area VTI: 1.72 cm2
AV Area mean vel: 1.75 cm2
AV Mean grad: 8 mmHg
AV Peak grad: 13.3 mmHg
Ao pk vel: 1.82 m/s
Area-P 1/2: 3.23 cm2
Calc EF: 60.2 %
S' Lateral: 3.2 cm
Single Plane A2C EF: 60.3 %
Single Plane A4C EF: 58.8 %

## 2023-10-29 ENCOUNTER — Ambulatory Visit: Attending: Cardiology | Admitting: Cardiology

## 2023-10-29 ENCOUNTER — Encounter: Payer: Self-pay | Admitting: Cardiology

## 2023-10-29 VITALS — BP 126/84 | HR 72 | Ht 62.0 in | Wt 175.0 lb

## 2023-10-29 DIAGNOSIS — I77810 Thoracic aortic ectasia: Secondary | ICD-10-CM

## 2023-10-29 DIAGNOSIS — R0609 Other forms of dyspnea: Secondary | ICD-10-CM | POA: Diagnosis not present

## 2023-10-29 DIAGNOSIS — I10 Essential (primary) hypertension: Secondary | ICD-10-CM | POA: Diagnosis not present

## 2023-10-29 DIAGNOSIS — Q2381 Bicuspid aortic valve: Secondary | ICD-10-CM

## 2023-10-29 DIAGNOSIS — R030 Elevated blood-pressure reading, without diagnosis of hypertension: Secondary | ICD-10-CM | POA: Insufficient documentation

## 2023-10-29 NOTE — Patient Instructions (Signed)

## 2023-10-29 NOTE — Progress Notes (Signed)
 Cardiology Office Note:    Date:  10/29/2023   ID:  Kristin [name], DOB September 10, 1960, MRN [id number]  PCP:  Ronna Coho, MD  Cardiologist:  Ralene Burger, MD    Referring MD: Ronna Coho, MD   Chief Complaint  Patient presents with   Blood Pressure Check    History of Present Illness:    Kristin [name] is a 63 y.o. female past medical history significant for questionable bicuspid aortic valve, aortic root dilatation 40 mm, essential hypertension, dyspnea on exertion comes today 2 months for follow-up overall she is doing better.  She started exercising on the regular basis which I strongly encouraged to do.  Blood blood pressure measurements are looking better.  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   Atypical chest pain 03/20/2021   Bicuspid aortic valve 03/16/2020   Dyspnea    Dyspnea on exertion 02/23/2012   Followed in Pulmonary clinic/ [facility]/ [place]    - 02/23/2012  Walked RA x 3 laps @ 185 ft each stopped due to  End of study, no tachypnea, tachycardia or desat     Elevated blood-pressure reading, without diagnosis of hypertension 01/28/2021   Endometriosis    High cholesterol 10/16/2020   History of basal cell carcinoma (BCC) 01/16/2023   Hyperlipidemia    Hypertension    IBS (irritable bowel syndrome)    Increased frequency of urination 07/24/2014   Intramural uterine fibroid 01/29/2019   Lichen planus 01/29/2021   Loss of hair    Pain, abdominal    Right lower quadrant pain 04/16/2021   Seasonal allergic rhinitis 10/16/2020   Skin cancer, basal cell    Stress 01/28/2021   Vitamin D deficiency     Past Surgical History:  Procedure Laterality Date   CESAREAN SECTION  1983    CHOLECYSTECTOMY  1990   In [place]   CYSTOURETHROSCOPY  2016   HYSTEROSCOPY  2015   LAPAROSCOPY  03/12/2016   with resection of pelvic adhesions   LAPAROTOMY  1990   post operative infection from cholecystectomy   OOPHORECTOMY Left 2000   RIGHT/LEFT HEART CATH AND CORONARY ANGIOGRAPHY N/A 04/23/2021   Procedure: RIGHT/LEFT HEART CATH AND CORONARY ANGIOGRAPHY;  Surgeon: Arty Binning, MD;  Location: [facility];  Service: Cardiovascular;  Laterality: N/A;   SKIN CANCER EXCISION     facial    Current Medications: Current Meds  Medication Sig   amLODipine  (NORVASC ) 2.5 MG tablet Take 1 tablet (2.5 mg total) by mouth daily as needed. For blood pressure above 150/100   aspirin  EC 81 MG tablet Take 1 tablet (81 mg total) by mouth daily. Swallow whole.   BIOTIN PO Take 1 tablet by mouth daily in the afternoon.   CALCIUM  PO Take 1 tablet by mouth daily in the afternoon.   cetirizine (ZYRTEC) 10 MG tablet Take 10 mg by mouth daily as needed for allergies.   Cholecalciferol (VITAMIN D-3 PO) Take 1 capsule by mouth daily in the afternoon.   clobetasol (TEMOVATE) 0.05 % external solution Apply 1 application  topically 2 (two) times daily as needed (scalp irritation).   Coenzyme Q10 (COQ10 PO) Take 1 capsule by mouth daily in the afternoon.   estradiol  (ESTRACE )  0.5 MG tablet Take 1 tablet (0.5 mg total) by mouth daily.   EVENING PRIMROSE OIL PO Take 1 capsule by mouth daily in the afternoon.   finasteride (PROSCAR) 5 MG tablet Take 5 mg by mouth every evening.   hyoscyamine  (LEVBID) 0.375 MG 12 hr tablet Take 0.375 mg by mouth at bedtime.   ibuprofen  (ADVIL ,MOTRIN ) 200 MG tablet Take 200-400 mg by mouth 2 (two) times daily as needed for mild pain, moderate pain or headache.   metoprolol  succinate (TOPROL -XL) 100 MG 24 hr tablet Take 1 tablet (100 mg total) by mouth daily. Take with or immediately following a meal.   Multiple Vitamin (MULTIVITAMIN WITH MINERALS) TABS tablet Take 1 tablet by  mouth daily in the afternoon. Unknown strength   omeprazole (PRILOSEC) 40 MG capsule Take 40 mg by mouth daily.   progesterone  (PROMETRIUM ) 200 MG capsule Take 1 capsule (200 mg total) by mouth daily.   rosuvastatin  (CRESTOR ) 5 MG tablet Take 1 tablet (5 mg total) by mouth daily.   VITAMIN E PO Take 1 capsule by mouth daily in the afternoon.   Current Facility-Administered Medications for the 10/29/23 encounter (Office Visit) with Paulla Mcclaskey J, MD  Medication   0.9 %  sodium chloride  infusion     Allergies:   Sanctura [trospium], Zithromax [azithromycin], Ditropan [oxybutynin], Nickel, Other, Sporanox [itraconazole], and Voltaren [diclofenac]   Social History   Socioeconomic History   Marital status: Married    Spouse name: Not on file   Number of children: 1   Years of education: Not on file   Highest education level: Not on file  Occupational History   Not on file  Tobacco Use   Smoking status: Former    Current packs/day: 0.00    Average packs/day: 1 pack/day for 10.0 years (10.0 ttl pk-yrs)    Types: Cigarettes    Start date: 05/19/1994    Quit date: 05/19/2004    Years since quitting: 19.4   Smokeless tobacco: Never  Vaping Use   Vaping status: Never Used  Substance and Sexual Activity   Alcohol use: No   Drug use: No   Sexual activity: Not Currently    Birth control/protection: Post-menopausal  Other Topics Concern   Not on file  Social History Narrative   Not on file   Social Drivers of Health   Financial Resource Strain: Not on file  Food Insecurity: Not on file  Transportation Needs: Not on file  Physical Activity: Not on file  Stress: Not on file  Social Connections: Not on file     Family History: The patient's family history includes Diabetes in her mother; Heart disease in her father; Kidney cancer in her mother; Liver disease in her father. ROS:   Please see the history of present illness.    All 14 point review of systems negative except as  described per history of present illness  EKGs/Labs/Other Studies Reviewed:         Recent Labs: 07/30/2023: ALT 23; BUN 18; Creatinine, Ser 0.65; Hemoglobin 12.2; Platelets 217; Potassium 3.9; Sodium 141 08/18/2023: TSH 1.810 09/25/2023: Pro B Natriuretic peptide (BNP) 25.0  Recent Lipid Panel    Component Value Date/Time   CHOL 177 05/16/2019 0754   TRIG 133 05/16/2019 0754   HDL 77 05/16/2019 0754   CHOLHDL 2.3 05/16/2019 0754   LDLCALC 77 05/16/2019 0754    Physical Exam:    VS:  BP 126/84 (BP Location: Left Arm, Patient Position: Sitting)   Pulse 72  Ht 5' 2 (1.575 m)   Wt 175 lb (79.4 kg)   LMP 12/28/2010   SpO2 93%   BMI 32.01 kg/m     Wt Readings from Last 3 Encounters:  10/29/23 175 lb (79.4 kg)  09/29/23 177 lb (80.3 kg)  09/25/23 177 lb 6.4 oz (80.5 kg)     GEN:  Well nourished, well developed in no acute distress HEENT: Normal NECK: No JVD; No carotid bruits LYMPHATICS: No lymphadenopathy CARDIAC: RRR, no murmurs, no rubs, no gallops RESPIRATORY:  Clear to auscultation without rales, wheezing or rhonchi  ABDOMEN: Soft, non-tender, non-distended MUSCULOSKELETAL:  No edema; No deformity  SKIN: Warm and dry LOWER EXTREMITIES: no swelling NEUROLOGIC:  Alert and oriented x 3 PSYCHIATRIC:  Normal affect   ASSESSMENT:    1. Bicuspid aortic valve   2. Primary hypertension   3. Aortic root dilatation (HCC)   4. Dyspnea on exertion    PLAN:    In order of problems listed above:  Questionable bicuspid aortic valve no significant stenosis noted continue monitoring echocardiogram reviewed with the patient. Essential hypertension much better right now continue present management I encouraged her to exercise on the regular basis. Aortic root dilatation.  I would caution her against as a volumetric exercises. Dyspnea on exertion will see her back in 3 months and see if exercises will make her feel better.   Medication Adjustments/Labs and Tests  Ordered: Current medicines are reviewed at length with the patient today.  Concerns regarding medicines are outlined above.  No orders of the defined types were placed in this encounter.  Medication changes: No orders of the defined types were placed in this encounter.   Signed, Manfred Seed, MD, [facility] 10/29/2023 1:33 PM    [facility]

## 2023-11-19 ENCOUNTER — Telehealth (HOSPITAL_COMMUNITY): Payer: Self-pay | Admitting: Internal Medicine

## 2023-11-21 ENCOUNTER — Other Ambulatory Visit: Payer: Self-pay | Admitting: Cardiology

## 2023-11-21 DIAGNOSIS — E785 Hyperlipidemia, unspecified: Secondary | ICD-10-CM

## 2023-11-21 DIAGNOSIS — Q211 Atrial septal defect, unspecified: Secondary | ICD-10-CM

## 2023-11-24 ENCOUNTER — Encounter: Payer: Self-pay | Admitting: Neurology

## 2023-11-24 ENCOUNTER — Ambulatory Visit: Payer: Self-pay | Admitting: Neurology

## 2023-11-24 VITALS — BP 138/82 | HR 62 | Ht 62.0 in | Wt 173.6 lb

## 2023-11-24 DIAGNOSIS — G43009 Migraine without aura, not intractable, without status migrainosus: Secondary | ICD-10-CM | POA: Diagnosis not present

## 2023-11-24 MED ORDER — NURTEC 75 MG PO TBDP: 75.0000 mg | ORAL_TABLET | Freq: Every day | ORAL | Status: AC | PRN

## 2023-11-24 NOTE — Progress Notes (Signed)
 [facility]    Provider:  Dr Ines Requesting Provider: Francis [name] SAILOR, PA-C Primary Care Provider:  Kip Righter, MD  CC:  Migraines  HPI:  Kristin [name] is a 63 y.o. female here as requested by Kristin [name] SAILOR, PA-C for migraines. has Dyspnea on exertion; Intramural uterine fibroid; Bicuspid aortic valve; ASD (atrial septal defect); High cholesterol; Vitamin D deficiency; Skin cancer, basal cell; Abdominal pain; Loss of hair; IBS (irritable bowel syndrome); Hypertension; Endometriosis; Allergy ; Stress; Seasonal allergic rhinitis; Elevated blood-pressure reading, without diagnosis of hypertension; Alopecia; Lichen planus; Atypical chest pain; Right lower quadrant pain; Aortic root dilatation (HCC); History of basal cell carcinoma (BCC); Increased frequency of urination; Finding of above normal blood pressure; and Migraine without aura and without status migrainosus, not intractable on their problem list.   From a thorough review of records and patient report, Medications tried that can be used in migraine/headache management greater than 3 months include: Lifestyle modification, headache diaries, better sleep hygiene, exercise, management of migraine triggers, OTC and prescribed analgesics/nsaids such as ibuprofen , excedrin, alleve and others, amlodipine /norvasc , robaxin, meclizine, metoprolol , valsartan , amitriptyline, maxalt, imitrex  She has elevated blood pressure and this happened in 2018 and recently in march 2025 went to the ED. She feels pressure on the top of the head, nausea, fatigue, pulsating/throbbing, sound sensitivity, no vomiting, hurts ot move, no autonomic symptoms. Radiates ot the back of the head. She feels her vision is blurry with the headache. The headaches last a few weeks when she gets them. She had covid this year and the headaches worsened since January. She has had covid 3x. Headache/migraine can be moderate to severe. Mother has headaches. Can  radiate to the back of the head. Happens 2x per year but 2 weeks she had headache. Also sensitivity to light. Unknown food triggers. Unknown triggers. Allergies can make it worse. She can wake up with the headaches. A few weeks ago it happened after the gym, she tries to avoid tylenol . Triptans contraindicated due to uncontrolled hypertension. Not too fatigued, she sleeps well. She has < 6 total headache days in a month and no more than 4 migraine days a month.    Reviewed notes, labs and imaging from outside physicians, which showed:  CT head 08/02/2023: CT HEAD WITHOUT CONTRAST   TECHNIQUE: Contiguous axial images were obtained from the base of the skull through the vertex without intravenous contrast.   RADIATION DOSE REDUCTION: This exam was performed according to the departmental dose-optimization program which includes automated exposure control, adjustment of the mA and/or kV according to patient size and/or use of iterative reconstruction technique.   COMPARISON:  11/12/2020   FINDINGS: Brain: The brain shows a normal appearance without evidence of malformation, atrophy, old or acute small or large vessel infarction, mass lesion, hemorrhage, hydrocephalus or extra-axial collection.   Vascular: No hyperdense vessel. No evidence of atherosclerotic calcification.   Skull: Normal.  No traumatic finding.  No focal bone lesion.   Sinuses/Orbits: Few small retention cysts. No significant sinus inflammatory disease. Orbits negative.   Other: None significant   IMPRESSION: Normal head CT. Few small retention cysts in the sinuses.  09/16/2023: B12 527 nml 08/18/2023 gba1c 5.8 slightly elevated, tsh nml 07/30/2023: cmp unremarkable, cbc nml   Review of Systems: Patient complains of symptoms per HPI as well as the following symptoms none. Pertinent negatives and positives per HPI. All others negative.   Social History   Socioeconomic History   Marital status: Married  Spouse name: Not on file   Number of children: 1   Years of education: Not on file   Highest education level: Not on file  Occupational History   Not on file  Tobacco Use   Smoking status: Former    Current packs/day: 0.00    Average packs/day: 1 pack/day for 10.0 years (10.0 ttl pk-yrs)    Types: Cigarettes    Start date: 05/19/1994    Quit date: 05/19/2004    Years since quitting: 19.5   Smokeless tobacco: Never  Vaping Use   Vaping status: Never Used  Substance and Sexual Activity   Alcohol use: No   Drug use: No   Sexual activity: Not Currently    Birth control/protection: Post-menopausal  Other Topics Concern   Not on file  Social History Narrative   Caffiene occasional   Working not working   Live husband.     Social Drivers of Corporate investment banker Strain: Not on file  Food Insecurity: Not on file  Transportation Needs: Not on file  Physical Activity: Not on file  Stress: Not on file  Social Connections: Not on file  Intimate Partner Violence: Not on file    Family History  Problem Relation Age of Onset   Diabetes Mother    Kidney cancer Mother    Leukemia Mother    Heart disease Father    Liver disease Father    Migraines Neg Hx     Past Medical History:  Diagnosis Date   Abdominal pain 01/28/2021   Allergy     Alopecia 01/28/2021   Aortic root dilatation (HCC) 06/03/2021   ASD (atrial septal defect) 03/16/2020   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   Atypical chest pain 03/20/2021   Bicuspid aortic valve 03/16/2020   Dyspnea    Dyspnea on exertion 02/23/2012   Followed in Pulmonary clinic/ [facility]/ [place]    - 02/23/2012  Walked RA x 3 laps @ 185 ft each stopped due to  End of study, no tachypnea, tachycardia or desat     Elevated blood-pressure reading, without diagnosis of hypertension 01/28/2021   Endometriosis    High cholesterol 10/16/2020   History of basal cell carcinoma (BCC) 01/16/2023    Hyperlipidemia    Hypertension    IBS (irritable bowel syndrome)    Increased frequency of urination 07/24/2014   Intramural uterine fibroid 01/29/2019   Lichen planus 01/29/2021   Loss of hair    Pain, abdominal    Right lower quadrant pain 04/16/2021   Seasonal allergic rhinitis 10/16/2020   Skin cancer, basal cell    Stress 01/28/2021   Vitamin D deficiency     Patient Active Problem List   Diagnosis Date Noted   Migraine without aura and without status migrainosus, not intractable 11/24/2023   Finding of above normal blood pressure 10/29/2023   History of basal cell carcinoma (BCC) 01/16/2023   Aortic root dilatation (HCC) 06/03/2021   Right lower quadrant pain 04/16/2021   Atypical chest pain 03/20/2021   Lichen planus 01/29/2021   Vitamin D deficiency 01/28/2021   Skin cancer, basal cell 01/28/2021   Abdominal pain 01/28/2021   Loss of hair 01/28/2021   IBS (irritable bowel syndrome) 01/28/2021   Hypertension 01/28/2021   Endometriosis 01/28/2021   Allergy  01/28/2021   Stress 01/28/2021   Elevated blood-pressure reading, without diagnosis of hypertension 01/28/2021   Alopecia 01/28/2021   High cholesterol 10/16/2020   Seasonal allergic rhinitis  10/16/2020   Bicuspid aortic valve 03/16/2020   ASD (atrial septal defect) 03/16/2020   Intramural uterine fibroid 01/29/2019   Increased frequency of urination 07/24/2014   Dyspnea on exertion 02/23/2012    Past Surgical History:  Procedure Laterality Date   CESAREAN SECTION  1983   CHOLECYSTECTOMY  1990   In [place]   CYSTOURETHROSCOPY  2016   HYSTEROSCOPY  2015   LAPAROSCOPY  03/12/2016   with resection of pelvic adhesions   LAPAROTOMY  1990   post operative infection from cholecystectomy   OOPHORECTOMY Left 2000   RIGHT/LEFT HEART CATH AND CORONARY ANGIOGRAPHY N/A 04/23/2021   Procedure: RIGHT/LEFT HEART CATH AND CORONARY ANGIOGRAPHY;  Surgeon: Claudene Victory ORN, MD;  Location: [facility];  Service:  Cardiovascular;  Laterality: N/A;   SKIN CANCER EXCISION     facial    Current Outpatient Medications  Medication Sig Dispense Refill   amLODipine  (NORVASC ) 2.5 MG tablet Take 1 tablet (2.5 mg total) by mouth daily as needed. For blood pressure above 150/100 90 tablet 3   aspirin  EC 81 MG tablet Take 1 tablet (81 mg total) by mouth daily. Swallow whole. 90 tablet 3   BIOTIN PO Take 1 tablet by mouth daily in the afternoon.     CALCIUM  PO Take 1 tablet by mouth daily in the afternoon.     cetirizine (ZYRTEC) 10 MG tablet Take 10 mg by mouth daily as needed for allergies.     Cholecalciferol (VITAMIN D-3 PO) Take 1 capsule by mouth daily in the afternoon.     clobetasol (TEMOVATE) 0.05 % external solution Apply 1 application  topically 2 (two) times daily as needed (scalp irritation).  0   Coenzyme Q10 (COQ10 PO) Take 1 capsule by mouth daily in the afternoon.     estradiol  (ESTRACE ) 0.5 MG tablet Take 1 tablet (0.5 mg total) by mouth daily. 90 tablet 3   EVENING PRIMROSE OIL PO Take 1 capsule by mouth daily in the afternoon.     finasteride (PROSCAR) 5 MG tablet Take 5 mg by mouth every evening.     hyoscyamine  (LEVBID ) 0.375 MG 12 hr tablet Take 0.375 mg by mouth at bedtime.  0   ibuprofen  (ADVIL ,MOTRIN ) 200 MG tablet Take 200-400 mg by mouth 2 (two) times daily as needed for mild pain, moderate pain or headache.     metoprolol  succinate (TOPROL -XL) 100 MG 24 hr tablet Take 1 tablet (100 mg total) by mouth daily. Take with or immediately following a meal. 90 tablet 3   Multiple Vitamin (MULTIVITAMIN WITH MINERALS) TABS tablet Take 1 tablet by mouth daily in the afternoon. Unknown strength     omeprazole (PRILOSEC) 40 MG capsule Take 40 mg by mouth daily.     progesterone  (PROMETRIUM ) 200 MG capsule Take 1 capsule (200 mg total) by mouth daily. 90 capsule 3   Rimegepant Sulfate (NURTEC) 75 MG TBDP Take 1 tablet (75 mg total) by mouth daily as needed. For migraines. Take as close to onset of  migraine as possible. One daily maximum.     rosuvastatin  (CRESTOR ) 5 MG tablet TAKE 1 TABLET BY MOUTH DAILY 90 tablet 3   VITAMIN E PO Take 1 capsule by mouth daily in the afternoon.     Current Facility-Administered Medications  Medication Dose Route Frequency Provider Last Rate Last Admin   0.9 %  sodium chloride  infusion  500 mL Intravenous Continuous Aneita Gwendlyn DASEN, MD        Allergies as  of 11/24/2023 - Review Complete 11/24/2023  Allergen Reaction Noted   Sanctura [trospium] Shortness Of Breath, Itching, and Rash 11/09/2020   Zithromax [azithromycin] Itching 03/23/2018   Ditropan [oxybutynin] Other (See Comments) 05/23/2022   Nickel Other (See Comments) 01/23/2018   Other Other (See Comments) 07/24/2014   Sporanox [itraconazole] Rash 08/27/2013   Voltaren [diclofenac] Rash     Vitals: BP 138/82 (Cuff Size: Normal)   Pulse 62   Ht 5' 2 (1.575 m)   Wt 173 lb 9.6 oz (78.7 kg)   LMP 12/28/2010   BMI 31.75 kg/m  Last Weight:  Wt Readings from Last 1 Encounters:  11/24/23 173 lb 9.6 oz (78.7 kg)   Last Height:   Ht Readings from Last 1 Encounters:  11/24/23 5' 2 (1.575 m)     Physical exam: Exam: Gen: NAD, conversant, well nourised, well groomed                     CV: RRR, no MRG. No Carotid Bruits. No peripheral edema, warm, nontender Eyes: Conjunctivae clear without exudates or hemorrhage  Neuro: Detailed Neurologic Exam  Speech:    Speech is normal; fluent and spontaneous with normal comprehension.  Cognition:    The patient is oriented to person, place, and time;     recent and remote memory intact;     language fluent;     normal attention, concentration,     fund of knowledge Cranial Nerves:    The pupils are equal, round, and reactive to light. The fundi are normal and spontaneous venous pulsations are present. Visual fields are full to finger confrontation. Extraocular movements are intact. Trigeminal sensation is intact and the muscles of  mastication are normal. The face is symmetric. The palate elevates in the midline. Hearing intact. Voice is normal. Shoulder shrug is normal. The tongue has normal motion without fasciculations.   Coordination: nml  Gait: nml  Motor Observation:    No asymmetry, no atrophy, and no involuntary movements noted. Tone:    Normal muscle tone.    Posture:    Posture is normal. normal erect    Strength:    Strength is V/V in the upper and lower limbs.      Sensation: intact to LT     Reflex Exam:  DTR's:    Deep tendon reflexes in the upper and lower extremities are normal bilaterally.   Toes:    The toes are downgoing bilaterally.   Clonus:    Clonus is absent.    Assessment/Plan:  Absolutely lovely patient with episodic migraines. CT head normal. Triptans contraindicated due to uncontrolled HTN with migraines (likely anxiety or pain but would not use triptans). Try Nurtec and then ubrelvy.  Also spent time education of migraines and migraine management, medication choices, acute vs prevention, strategies as well as below To prevent or relieve headaches, try the following: Cool Compress. Lie down and place a cool compress on your head.  Avoid headache triggers. If certain foods or odors seem to have triggered your migraines in the past, avoid them. A headache diary might help you identify triggers.  Include physical activity in your daily routine. Try a daily walk or other moderate aerobic exercise.  Manage stress. Find healthy ways to cope with the stressors, such as delegating tasks on your to-do list.  Practice relaxation techniques. Try deep breathing, yoga, massage and visualization.  Eat regularly. Eating regularly scheduled meals and maintaining a healthy diet might help prevent headaches. Also,  drink plenty of fluids.  Follow a regular sleep schedule. Sleep deprivation might contribute to headaches Consider biofeedback. With this mind-body technique, you learn to control  certain bodily functions -- such as muscle tension, heart rate and blood pressure -- to prevent headaches or reduce headache pain.    Proceed to emergency room if you experience new or worsening symptoms or symptoms do not resolve, if you have new neurologic symptoms or if headache is severe, or for any concerning symptom.   Provided education and documentation including informational packet on: migraines, symptoms as well as aura aura, what causes migraines, migraine triggers, chronic migraine medication overuse headache, chronic migraines, prevention of migraines both acute and preventative and the different choices and classes, nondrug treatments, behavioral and other nonpharmacologic treatments for headache.    No orders of the defined types were placed in this encounter.  Meds ordered this encounter  Medications   Rimegepant Sulfate (NURTEC) 75 MG TBDP    Sig: Take 1 tablet (75 mg total) by mouth daily as needed. For migraines. Take as close to onset of migraine as possible. One daily maximum.    Cc: Kristin [name] LOISE DEVONNA Kip Beverley, MD  Onetha Epp, MD  [facility] [address] [place], [place] [address]  Phone [phone] Fax [phone]

## 2023-11-24 NOTE — Patient Instructions (Addendum)
 At onset of migraine please take Nurtec once daily as needed  Rimegepant Disintegrating Tablets What is this medication? RIMEGEPANT (ri ME je pant) prevents and treats migraines. It works by blocking a substance in the body that causes migraines. This medicine may be used for other purposes; ask your health care provider or pharmacist if you have questions. COMMON BRAND NAME(S): NURTEC ODT What should I tell my care team before I take this medication? They need to know if you have any of these conditions: Kidney disease Liver disease An unusual or allergic reaction to rimegepant, other medications, foods, dyes, or preservatives Pregnant or trying to get pregnant Breast-feeding How should I use this medication? Take this medication by mouth. Take it as directed on the prescription label. Leave the tablet in the sealed pack until you are ready to take it. With dry hands, open the pack and gently remove the tablet. If the tablet breaks or crumbles, throw it away. Use a new tablet. Place the tablet in the mouth and allow it to dissolve. Then, swallow it. Do not cut, crush, or chew this medication. You do not need water to take this medication. Talk to your care team about the use of this medication in children. Special care may be needed. Overdosage: If you think you have taken too much of this medicine contact a poison control center or emergency room at once. NOTE: This medicine is only for you. Do not share this medicine with others. What if I miss a dose? This does not apply. This medication is not for regular use. What may interact with this medication? Certain medications for fungal infections, such as fluconazole , itraconazole Rifampin This list may not describe all possible interactions. Give your health care provider a list of all the medicines, herbs, non-prescription drugs, or dietary supplements you use. Also tell them if you smoke, drink alcohol, or use illegal drugs. Some items may  interact with your medicine. What should I watch for while using this medication? Visit your care team for regular checks on your progress. Tell your care team if your symptoms do not start to get better or if they get worse. What side effects may I notice from receiving this medication? Side effects that you should report to your care team as soon as possible: Allergic reactions--skin rash, itching, hives, swelling of the face, lips, tongue, or throat Side effects that usually do not require medical attention (report to your care team if they continue or are bothersome): Nausea Stomach pain This list may not describe all possible side effects. Call your doctor for medical advice about side effects. You may report side effects to FDA at 1-800-FDA-1088. Where should I keep my medication? Keep out of the reach of children and pets. Store at room temperature between 20 and 25 degrees C (68 and 77 degrees F). Get rid of any unused medication after the expiration date. To get rid of medications that are no longer needed or have expired: Take the medication to a medication take-back program. Check with your pharmacy or law enforcement to find a location. If you cannot return the medication, check the label or package insert to see if the medication should be thrown out in the garbage or flushed down the toilet. If you are not sure, ask your care team. If it is safe to put it in the trash, take the medication out of the container. Mix the medication with cat litter, dirt, coffee grounds, or other unwanted substance. Seal the mixture  in a bag or container. Put it in the trash. NOTE: This sheet is a summary. It may not cover all possible information. If you have questions about this medicine, talk to your doctor, pharmacist, or health care provider.  2024 Elsevier/Gold Standard (2021-06-26 00:00:00)

## 2023-11-25 ENCOUNTER — Ambulatory Visit: Payer: Self-pay | Admitting: Diagnostic Neuroimaging

## 2023-11-26 ENCOUNTER — Ambulatory Visit: Admitting: Cardiology

## 2023-12-10 ENCOUNTER — Ambulatory Visit (HOSPITAL_COMMUNITY)

## 2023-12-10 DIAGNOSIS — Z87891 Personal history of nicotine dependence: Secondary | ICD-10-CM

## 2023-12-10 DIAGNOSIS — Q2381 Bicuspid aortic valve: Secondary | ICD-10-CM | POA: Diagnosis present

## 2023-12-10 DIAGNOSIS — R0609 Other forms of dyspnea: Secondary | ICD-10-CM | POA: Diagnosis present

## 2023-12-10 DIAGNOSIS — I10 Essential (primary) hypertension: Secondary | ICD-10-CM | POA: Diagnosis not present

## 2023-12-10 DIAGNOSIS — N809 Endometriosis, unspecified: Secondary | ICD-10-CM | POA: Insufficient documentation

## 2023-12-10 DIAGNOSIS — I77810 Thoracic aortic ectasia: Secondary | ICD-10-CM | POA: Diagnosis not present

## 2023-12-10 DIAGNOSIS — Z85828 Personal history of other malignant neoplasm of skin: Secondary | ICD-10-CM | POA: Insufficient documentation

## 2023-12-10 DIAGNOSIS — R030 Elevated blood-pressure reading, without diagnosis of hypertension: Secondary | ICD-10-CM | POA: Insufficient documentation

## 2023-12-10 DIAGNOSIS — R609 Edema, unspecified: Secondary | ICD-10-CM

## 2023-12-11 DIAGNOSIS — R0600 Dyspnea, unspecified: Secondary | ICD-10-CM | POA: Diagnosis not present

## 2023-12-30 ENCOUNTER — Ambulatory Visit: Admitting: Adult Health

## 2023-12-30 ENCOUNTER — Ambulatory Visit: Admitting: Cardiology

## 2024-01-01 ENCOUNTER — Ambulatory Visit: Admitting: Cardiology

## 2024-01-04 ENCOUNTER — Encounter: Payer: Self-pay | Admitting: Adult Health

## 2024-01-04 ENCOUNTER — Ambulatory Visit: Admitting: Adult Health

## 2024-01-04 VITALS — BP 154/85 | HR 72 | Temp 98.0°F | Ht 62.0 in | Wt 173.2 lb

## 2024-01-04 DIAGNOSIS — Z23 Encounter for immunization: Secondary | ICD-10-CM

## 2024-01-04 DIAGNOSIS — J302 Other seasonal allergic rhinitis: Secondary | ICD-10-CM

## 2024-01-04 DIAGNOSIS — R0609 Other forms of dyspnea: Secondary | ICD-10-CM

## 2024-01-04 NOTE — Patient Instructions (Addendum)
 Activity as tolerated.  Prevnar vaccine Follow up with our office As needed

## 2024-01-04 NOTE — Progress Notes (Signed)
 @Patient  ID: Kristin [name], female    DOB: November 25, 1960, 62 y.o.   MRN: [id number]  Chief Complaint  Patient presents with   Follow-up    82m follow up  Wants Pneumococcal Vaccine      Referring provider: Kip Righter, MD  HPI: 63 yo female former smoker seen for consult 06/02/23 for dyspnea  Previous diagnosis of Allergic rhinitis and mild intermittent asthma followed by Asthma/Allergy  From [place]   TEST/EVENTS :  BNP/D Dimer 09/2023 neg   Echo 10/27/23 nml EF, Gr II DD,    01/04/2024  Discussed the use of AI scribe software for clinical note transcription with the patient, who gave verbal consent to proceed.  History of Present Illness Kristin [name] is a 63 year old female with diastolic dysfunction who presents with shortness of breath.  She experiences shortness of breath, described as a sensation of insufficient oxygen at times.  Since last visit she is feeling better her shortness of breath has improved substantially since she started exercising and going to the gym.  She feels that her activity level and stamina has improved.  Pulmonary function testing Sep 24, 2023 showed minimum restriction and normal diffusing capacity.  No significant airflow obstruction.  Previously had been diagnosed with some allergic rhinitis and mild intermittent asthma. She underwent a cardiopulmonary test, with preliminary results indicating Exercise testing with gas exchange demonstrates normal functional capabilities when compared to matched sedentary norms , though the final report is pending.  She has a history of asthma and allergies, for which she received treatment for five years, including allergy  shots. Currently, she is not on any allergy  medications but takes Claritin or Zyrtec as needed during allergy  seasons. No current daily asthma symptoms, cough, or wheezing.  She has been actively engaging in physical exercise for the past three months, attending the gym regularly and walking  daily. She reports losing five pounds and feeling better overall.  She has a history of COVID-19 and pneumonia,  She has had three COVID-19 infections and reports that her recovery from illnesses tends to be prolonged compared to her husband. CT chest October 2024 showed some platelike atelectasis. 2D echo October 27, 2023 showed EF at 55 to 60%, grade 2 diastolic dysfunction, RV size is normal.   Allergies  Allergen Reactions   Sanctura [Trospium] Shortness Of Breath, Itching and Rash   Zithromax [Azithromycin] Itching   Ditropan [Oxybutynin] Other (See Comments)    Vision changes   Nickel Other (See Comments)    Per allergy  test   Other Other (See Comments)    allergies to mold, cats, dogs, trees, and grass - causes nasal congestion   Sporanox [Itraconazole] Rash    08/24/23- pt called to clarify allergy ; pt states this was many years ago and she does not remember   Voltaren [Diclofenac] Rash    Immunization History  Administered Date(s) Administered   Influenza,inj,Quad PF,6+ Mos 02/03/2018, 01/02/2019   Influenza-Unspecified 02/04/2021, 02/20/2022, 01/18/2023   PFIZER(Purple Top)SARS-COV-2 Vaccination 07/30/2019, 08/30/2019   PNEUMOCOCCAL CONJUGATE-20 01/04/2024   Tdap 04/20/2015   Unspecified SARS-COV-2 Vaccination 06/05/2023   Zoster Recombinant(Shingrix) 01/02/2019, 03/04/2019    Past Medical History:  Diagnosis Date   Abdominal pain 01/28/2021   Allergy     Alopecia 01/28/2021   Aortic root dilatation (HCC) 06/03/2021   ASD (atrial septal defect) 03/16/2020   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   Atypical chest pain 03/20/2021   Bicuspid aortic valve 03/16/2020   Dyspnea  Dyspnea on exertion 02/23/2012   Followed in Pulmonary clinic/ [facility]/ [place]    - 02/23/2012  Walked RA x 3 laps @ 185 ft each stopped due to  End of study, no tachypnea, tachycardia or desat     Elevated blood-pressure reading, without diagnosis of hypertension  01/28/2021   Endometriosis    High cholesterol 10/16/2020   History of basal cell carcinoma (BCC) 01/16/2023   Hyperlipidemia    Hypertension    IBS (irritable bowel syndrome)    Increased frequency of urination 07/24/2014   Intramural uterine fibroid 01/29/2019   Lichen planus 01/29/2021   Loss of hair    Pain, abdominal    Right lower quadrant pain 04/16/2021   Seasonal allergic rhinitis 10/16/2020   Skin cancer, basal cell    Stress 01/28/2021   Vitamin D deficiency     Tobacco History: Social History   Tobacco Use  Smoking Status Former   Current packs/day: 0.00   Average packs/day: 1 pack/day for 10.0 years (10.0 ttl pk-yrs)   Types: Cigarettes   Start date: 05/19/1994   Quit date: 05/19/2004   Years since quitting: 19.6  Smokeless Tobacco Never   Counseling given: Not Answered   Outpatient Medications Prior to Visit  Medication Sig Dispense Refill   amLODipine  (NORVASC ) 2.5 MG tablet Take 1 tablet (2.5 mg total) by mouth daily as needed. For blood pressure above 150/100 90 tablet 3   aspirin  EC 81 MG tablet Take 1 tablet (81 mg total) by mouth daily. Swallow whole. 90 tablet 3   BIOTIN PO Take 1 tablet by mouth daily in the afternoon.     CALCIUM  PO Take 1 tablet by mouth daily in the afternoon.     cetirizine (ZYRTEC) 10 MG tablet Take 10 mg by mouth daily as needed for allergies.     Cholecalciferol (VITAMIN D-3 PO) Take 1 capsule by mouth daily in the afternoon.     clobetasol (TEMOVATE) 0.05 % external solution Apply 1 application  topically 2 (two) times daily as needed (scalp irritation).  0   Coenzyme Q10 (COQ10 PO) Take 1 capsule by mouth daily in the afternoon.     estradiol  (ESTRACE ) 0.5 MG tablet Take 1 tablet (0.5 mg total) by mouth daily. 90 tablet 3   EVENING PRIMROSE OIL PO Take 1 capsule by mouth daily in the afternoon.     finasteride (PROSCAR) 5 MG tablet Take 5 mg by mouth every evening.     hyoscyamine  (LEVBID ) 0.375 MG 12 hr tablet Take 0.375 mg  by mouth at bedtime.  0   ibuprofen  (ADVIL ,MOTRIN ) 200 MG tablet Take 200-400 mg by mouth 2 (two) times daily as needed for mild pain, moderate pain or headache.     metoprolol  succinate (TOPROL -XL) 100 MG 24 hr tablet Take 1 tablet (100 mg total) by mouth daily. Take with or immediately following a meal. 90 tablet 3   Multiple Vitamin (MULTIVITAMIN WITH MINERALS) TABS tablet Take 1 tablet by mouth daily in the afternoon. Unknown strength     omeprazole (PRILOSEC) 40 MG capsule Take 40 mg by mouth daily.     progesterone  (PROMETRIUM ) 200 MG capsule Take 1 capsule (200 mg total) by mouth daily. 90 capsule 3   Rimegepant Sulfate (NURTEC) 75 MG TBDP Take 1 tablet (75 mg total) by mouth daily as needed. For migraines. Take as close to onset of migraine as possible. One daily maximum.     rosuvastatin  (CRESTOR ) 5 MG tablet TAKE 1 TABLET  BY MOUTH DAILY 90 tablet 3   VITAMIN E PO Take 1 capsule by mouth daily in the afternoon.     Facility-Administered Medications Prior to Visit  Medication Dose Route Frequency Provider Last Rate Last Admin   0.9 %  sodium chloride  infusion  500 mL Intravenous Continuous Aneita Gwendlyn DASEN, MD         Review of Systems:   Constitutional:   No  weight loss, night sweats,  Fevers, chills, fatigue, or  lassitude.  HEENT:   No headaches,  Difficulty swallowing,  Tooth/dental problems, or  Sore throat,                No sneezing, itching, ear ache, nasal congestion, post nasal drip,   CV:  No chest pain,  Orthopnea, PND, swelling in lower extremities, anasarca, dizziness, palpitations, syncope.   GI  No heartburn, indigestion, abdominal pain, nausea, vomiting, diarrhea, change in bowel habits, loss of appetite, bloody stools.   Resp: No shortness of breath with exertion or at rest.  No excess mucus, no productive cough,  No non-productive cough,  No coughing up of blood.  No change in color of mucus.  No wheezing.  No chest wall deformity  Skin: no rash or  lesions.  GU: no dysuria, change in color of urine, no urgency or frequency.  No flank pain, no hematuria   MS:  No joint pain or swelling.  No decreased range of motion.  No back pain.    Physical Exam  BP (!) 154/85 Comment: Takes BP meds in the evening  Pulse 72   Temp 98 F (36.7 C) (Oral)   Ht 5' 2 (1.575 m)   Wt 173 lb 3.2 oz (78.6 kg)   LMP 12/28/2010   SpO2 96%   BMI 31.68 kg/m   GEN: A/Ox3; pleasant , NAD, well nourished    HEENT:  [place]/AT,     NOSE-clear, THROAT-clear, no lesions, no postnasal drip or exudate noted.   NECK:  Supple w/ fair ROM; no JVD; normal carotid impulses w/o bruits; no thyromegaly or nodules palpated; no lymphadenopathy.    RESP  Clear  P & A; w/o, wheezes/ rales/ or rhonchi. no accessory muscle use, no dullness to percussion  CARD:  RRR, no m/r/g, no peripheral edema, pulses intact, no cyanosis or clubbing.  GI:   Soft & nt; nml bowel sounds; no organomegaly or masses detected.   Musco: Warm bil, no deformities or joint swelling noted.   Neuro: alert, no focal deficits noted.    Skin: Warm, no lesions or rashes    Lab Results:  CBC    Component Value Date/Time   WBC 5.0 07/30/2023 0900   RBC 3.98 07/30/2023 0900   HGB 12.2 07/30/2023 0900   HGB 13.3 04/19/2021 1449   HCT 36.1 07/30/2023 0900   HCT 39.1 04/19/2021 1449   PLT 217 07/30/2023 0900   PLT 272 04/19/2021 1449   MCV 90.7 07/30/2023 0900   MCV 90 04/19/2021 1449   MCH 30.7 07/30/2023 0900   MCHC 33.8 07/30/2023 0900   RDW 12.3 07/30/2023 0900   RDW 12.3 04/19/2021 1449   LYMPHSABS 1.4 07/30/2023 0900   LYMPHSABS 2.5 03/19/2020 1615   MONOABS 0.5 07/30/2023 0900   EOSABS 0.2 07/30/2023 0900   EOSABS 0.1 03/19/2020 1615   BASOSABS 0.1 07/30/2023 0900   BASOSABS 0.1 03/19/2020 1615    BMET    Component Value Date/Time   NA 141 07/30/2023 0900   NA  136 12/04/2021 0929   K 3.9 07/30/2023 0900   CL 108 07/30/2023 0900   CO2 24 07/30/2023 0900   GLUCOSE 105  (H) 07/30/2023 0900   BUN 18 07/30/2023 0900   BUN 19 12/04/2021 0929   CREATININE 0.65 07/30/2023 0900   CALCIUM  8.6 (L) 07/30/2023 0900   GFRNONAA >60 07/30/2023 0900   GFRAA >60 01/23/2020 0840    BNP No results found for: BNP  ProBNP    Component Value Date/Time   PROBNP 25.0 09/25/2023 1034    Imaging: No results found.  Administration History     None          Latest Ref Rng & Units 09/24/2023    8:09 AM  PFT Results  FVC-Pre L 2.18   FVC-Predicted Pre % 72   FVC-Post L 2.24   FVC-Predicted Post % 74   Pre FEV1/FVC % % 83   Post FEV1/FCV % % 87   FEV1-Pre L 1.82   FEV1-Predicted Pre % 78   FEV1-Post L 1.95   DLCO uncorrected ml/min/mmHg 17.31   DLCO UNC% % 92   DLCO corrected ml/min/mmHg 18.01   DLCO COR %Predicted % 96   DLVA Predicted % 127   TLC L 4.19   TLC % Predicted % 88   RV % Predicted % 101     No results found for: NITRICOXIDE      Assessment & Plan:   No problem-specific Assessment & Plan notes found for this encounter.  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  She has had significant symptom improvement with increase in activity tolerance.  Preliminary cardiopulmonary test results with no significant arrhythmias or airflow obstruction. . Continue follow-up with the cardiologist next month and maintain the current exercise regimen to improve physical conditioning and cardiac function.  Physical deconditioning   There is improvement in physical conditioning with regular exercise over the past three months, including weight loss and increased stamina. Physical deconditioning likely contributes to symptoms of shortness of breath. Encourage continued regular exercise, aiming for 30 minutes of purposeful activity daily. Monitor for any respiratory symptoms and follow up as needed.  Allergic rhinitis   Seasonal  allergic symptoms are managed with over-the-counter antihistamines such as Claritin or Zyrtec as needed. Recommend taking Claritin or Zyrtec regularly during allergy  season to prevent full-blown symptoms.  Pneumococcal vaccination for prevention of recurrent pneumonia   She has not received a pneumococcal vaccine previously. Given frequent respiratory infections, including COVID-19 and possible pneumonia, vaccination is recommended to prevent severe pneumococcal disease. Discussed potential side effects of the vaccine, including soreness at the injection site, low-grade fever, and achiness. Administer Prevnar pneumococcal vaccine today.   Plan  Patient Instructions  Activity as tolerated.  Prevnar vaccine Follow up with our office As needed       Madelin Stank, NP 01/04/2024

## 2024-01-11 ENCOUNTER — Ambulatory Visit: Payer: Self-pay | Admitting: Internal Medicine

## 2024-01-11 NOTE — Telephone Encounter (Signed)
    I am not understanding the patient's concern.  Just because the result is preliminary does not mean that we cannot interpret and give the result.  In fact on January 04, 2024 Tammy and I discussed the result and I shared with Tammy what expressed to the patient.  1) I reviewed a max of 19.1-25 -this is a fair/good range for a woman age 63  2) the weight is definitely contributing to her shortness of breath along with a stiff heart muscle.  But her overall excess capacity is satisfactory but the shortness of breath and the exercise capacity can improve with weight loss and physical exercise program  3) there is a new exercise tach and I do not know when these test results will be read.  I am not the one reading it but I am comfortable with the interpretation that Tammy gave her   There is no height or weight on file to calculate BMI.   xxxxx  Interpretation  December 11, 2023  Notes: Patient gave great effort.  Started with a modified Naughton but instead of transitioning to 29mph/ 7.5% in stage 6, I kept her at and increased the incline to 12.5% because I could see her RER going up and I didn't want her to stop before hitting 1.1.  Pulse-oximetry fluctuated between 91-100% for the duration of exercise.   ECG:  Resting ECG NSR. No sustained arrhythmias or ST-T changes. PVC in recovery.  HR response to exercise is appropriate- HR did not reach its max predicted value (86% of predicted).   PFT:  Pre-exercise spirometry demonstrates normal lung capacity. MVV is normal. Post exercise PFT suggests dilation 1 min after exercise. I took a 5 and 10 min post test FEV1 as well but did not meet ATS guidelines.   CPX:  Exercise testing with gas exchange demonstrates a normal functional capabilities with a peak VO2 of 19.1 ml/kg/min (99% of the age/gender/weight matched sedentary norms). The RER of 1.1 indicates a maximal effort. When adjusted to the patient's ideal body weight of 131.3 lb (59.6 kg)  the peak VO2 is 25.3 ml/kg (ibw)/min (108% of the ibw-adjusted predicted). The VE/VCO2 slope is slightly elevated indicating an increased dead space ventilation. The oxygen uptake efficiency slope (OUES) is normal (above predicted value). The VO2 at the ventilatory threshold was normal at 76% of the peak VO2. At peak exercise, the ventilation reached 67.8% of the measured MVV indicating ventilatory reserve remained. The O2pulse (a surrogate for stroke volume) increased initially with exercise and plateaus above threshold at 36ml/beat.   Preliminary impression: Exercise testing with gas exchange demonstrates normal functional capabilities when compared to matched sedentary norms.   Preliminary CPX results, Finalized results will be forwarded when completed by interpreting physician   Test, report and preliminary impression by:  Damien Nunnery, PhD, ACSM-EP  12/11/2023 10:31 AM

## 2024-01-11 NOTE — Telephone Encounter (Signed)
 FYI Only or Action Required?: Action required by provider: Per pt chart, results are still preliminary, pt needs call back with more info about when to expect final results, please expedite if at all possible, pt states she has called 4x for results.  Patient is followed in Pulmonology for dyspnea on exertion, last seen on 01/04/2024 by Parrett, [name], NP.  Called Nurse Triage reporting Results and denies new or worsening symptoms.  Symptoms are: denies new or worsening symptoms.  Triage Disposition: Call PCP When Office is Open  Patient/caregiver understands and will follow disposition?: Yes     Copied from CRM #[id number]. Topic: Clinical - Red Word Triage >> Jan 11, 2024 10:45 AM Nathanel DEL wrote: Red Word that prompted transfer to Nurse Triage: nurse Chantll calling to speak w/ Dr [name] nurse Nurse from advanced heart failure clinic at [facility], wouldn't say anything about symptoms   Reason for Disposition  [1] Caller requesting NON-URGENT health information AND [2] PCP's office is the best resource  Answer Assessment - Initial Assessment Questions Nurse Chantell from [facility] at [name], pt on line as well  1. REASON FOR CALL: What is the main reason for your call? or How can I best help you?     had cardiopulm test on 7/25, hard time getting results and getting feedback on follow up Wondering about heart, more important to me, next month see cardiologist, want results Would not answer if new or worsening symptoms Think not related to lungs, heart is problem Want to see test results, need cardio to see Have called 4x, how long need wait for results Have bill for $150 for this test and don't know results  2. SYMPTOMS : Do you have any symptoms?      Feel like don't have enough breath, sometimes pain in heart This happens sometimes and go away, see cardio every 3 months Denies more frequency or intensity to problem Confirms no new or worsening  symptoms  3. OTHER QUESTIONS: Do you have any other questions?     Just want results   Per pt chart, results are still preliminary, pt needs call back with more info about when to expect final results, please expedite if at all possible.  Protocols used: Information Only Call - No Triage-A-AH

## 2024-01-11 NOTE — Telephone Encounter (Signed)
 Spoke with patient regarding prior message. Patient had a Cardiopulmonary exercise test done on 12/14/23.Patient has been requesting result's  Dr.Ramaswamy can you please advise

## 2024-01-12 NOTE — Telephone Encounter (Signed)
 Spoke with patient regarding prior message . Gave patient per Dr.Ramaswamy        I am not understanding the patient's concern.  Just because the result is preliminary does not mean that we cannot interpret and give the result.  In fact on January 04, 2024 Tammy and I discussed the result and I shared with Tammy what expressed to the patient.   1) I reviewed a max of 19.1-25 -this is a fair/good range for a woman age 63   2) the weight is definitely contributing to her shortness of breath along with a stiff heart muscle.  But her overall excess capacity is satisfactory but the shortness of breath and the exercise capacity can improve with weight loss and physical exercise program   3) there is a new exercise tach and I do not know when these test results will be read.  I am not the one reading it but I am comfortable with the interpretation that Tammy gave her     There is no height or weight on file to calculate BMI.     xxxxx  Interpretation  December 11, 2023  Notes: Patient gave great effort.  Started with a modified Naughton but instead of transitioning to 87mph/ 7.5% in stage 6, I kept her at and increased the incline to 12.5% because I could see her RER going up and I didn't want her to stop before hitting 1.1.  Pulse-oximetry fluctuated between 91-100% for the duration of exercise.   ECG:  Resting ECG NSR. No sustained arrhythmias or ST-T changes. PVC in recovery.  HR response to exercise is appropriate- HR did not reach its max predicted value (86% of predicted).   PFT:  Pre-exercise spirometry demonstrates normal lung capacity. MVV is normal. Post exercise PFT suggests dilation 1 min after exercise. I took a 5 and 10 min post test FEV1 as well but did not meet ATS guidelines.   CPX:  Exercise testing with gas exchange demonstrates a normal functional capabilities with a peak VO2 of 19.1 ml/kg/min (99% of the age/gender/weight matched sedentary norms). The RER of 1.1 indicates a  maximal effort. When adjusted to the patient's ideal body weight of 131.3 lb (59.6 kg) the peak VO2 is 25.3 ml/kg (ibw)/min (108% of the ibw-adjusted predicted). The VE/VCO2 slope is slightly elevated indicating an increased dead space ventilation. The oxygen uptake efficiency slope (OUES) is normal (above predicted value). The VO2 at the ventilatory threshold was normal at 76% of the peak VO2. At peak exercise, the ventilation reached 67.8% of the measured MVV indicating ventilatory reserve remained. The O2pulse (a surrogate for stroke volume) increased initially with exercise and plateaus above threshold at 40ml/beat.   Preliminary impression: Exercise testing with gas exchange demonstrates normal functional capabilities when compared to matched sedentary norms.   Preliminary CPX results, Finalized results will be forwarded when completed by interpreting physician   Test, report and preliminary impression by:  Damien Nunnery, PhD, ACSM-EP  12/11/2023 10:31 AM      Advised patient I will send this to her mychart  Patient's voice was understanding.Northing else further needed.

## 2024-02-01 ENCOUNTER — Encounter: Payer: Self-pay | Admitting: Obstetrics & Gynecology

## 2024-02-01 ENCOUNTER — Other Ambulatory Visit: Payer: Self-pay | Admitting: Obstetrics & Gynecology

## 2024-02-01 DIAGNOSIS — Z1231 Encounter for screening mammogram for malignant neoplasm of breast: Secondary | ICD-10-CM

## 2024-02-04 ENCOUNTER — Ambulatory Visit: Admitting: Cardiology

## 2024-03-17 ENCOUNTER — Ambulatory Visit
Admission: RE | Admit: 2024-03-17 | Discharge: 2024-03-17 | Disposition: A | Discharge status: Home / Self Care | Source: Ambulatory Visit | Attending: Obstetrics & Gynecology | Admitting: Obstetrics & Gynecology

## 2024-03-17 DIAGNOSIS — Z1231 Encounter for screening mammogram for malignant neoplasm of breast: Secondary | ICD-10-CM

## 2024-04-05 ENCOUNTER — Ambulatory Visit (HOSPITAL_COMMUNITY)
Admission: RE | Admit: 2024-04-05 | Discharge: 2024-04-05 | Disposition: A | Discharge status: Home / Self Care | Source: Ambulatory Visit | Attending: Vascular Surgery | Admitting: Vascular Surgery

## 2024-04-05 ENCOUNTER — Other Ambulatory Visit (HOSPITAL_COMMUNITY): Payer: Self-pay | Admitting: Physician Assistant

## 2024-04-05 DIAGNOSIS — R52 Pain, unspecified: Secondary | ICD-10-CM | POA: Diagnosis present

## 2024-04-21 ENCOUNTER — Ambulatory Visit: Attending: Cardiology | Admitting: Cardiology

## 2024-04-21 ENCOUNTER — Encounter: Payer: Self-pay | Admitting: Cardiology

## 2024-04-21 VITALS — BP 146/90 | HR 72 | Ht 63.0 in | Wt 166.2 lb

## 2024-04-21 DIAGNOSIS — E7800 Pure hypercholesterolemia, unspecified: Secondary | ICD-10-CM

## 2024-04-21 DIAGNOSIS — I10 Essential (primary) hypertension: Secondary | ICD-10-CM | POA: Diagnosis not present

## 2024-04-21 DIAGNOSIS — I77810 Thoracic aortic ectasia: Secondary | ICD-10-CM | POA: Diagnosis not present

## 2024-04-21 DIAGNOSIS — Q2381 Bicuspid aortic valve: Secondary | ICD-10-CM | POA: Diagnosis not present

## 2024-04-21 DIAGNOSIS — Z131 Encounter for screening for diabetes mellitus: Secondary | ICD-10-CM

## 2024-04-21 MED ORDER — ROSUVASTATIN CALCIUM 10 MG PO TABS: 10.0000 mg | ORAL_TABLET | Freq: Every day | ORAL | 3 refills | Status: AC

## 2024-04-21 NOTE — Patient Instructions (Addendum)
 Medication Instructions:   INCREASE: Crestor  10mg  daily   Lab Work: 3rd Floor   [address]  Your physician recommends that you return for lab work in:   1 month  You need to have labs done when you are fasting.  You can come Monday through Friday 8:00 am to 11:30AM and 1:00 to 4:00. You do not need to make an appointment as the order has already been placed.   Testing/Procedures: None Ordered   Follow-Up: At [facility], you and your health needs are our priority.  As part of our continuing mission to provide you with exceptional heart care, we have created designated Provider Care Teams.  These Care Teams include your primary Cardiologist (physician) and Advanced Practice Providers (APPs -  Physician Assistants and Nurse Practitioners) who all work together to provide you with the care you need, when you need it.  We recommend signing up for the patient portal called MyChart.  Sign up information is provided on this After Visit Summary.  MyChart is used to connect with patients for Virtual Visits (Telemedicine).  Patients are able to view lab/test results, encounter notes, upcoming appointments, etc.  Non-urgent messages can be sent to your provider as well.   To learn more about what you can do with MyChart, go to [email or web address].    Your next appointment:   6 month(s)  The format for your next appointment:   In Person  Provider:   Lamar Fitch, MD    Other Instructions NA

## 2024-04-21 NOTE — Progress Notes (Signed)
 Cardiology Office Note:    Date:  04/21/2024   ID:  Kristin [name], DOB April 05, 1961, MRN [id number]  PCP:  Kip Righter, MD  Cardiologist:  Lamar Fitch, MD    Referring MD: Kip Righter, MD   No chief complaint on file.   History of Present Illness:    Kristin [name] is a 63 y.o. female past medical history significant for questionable bicuspid aortic valve, aortic root dilatation 40 mm, essential hypertension, prediabetes.  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   Atypical chest pain 03/20/2021   Bicuspid aortic valve 03/16/2020   Dyspnea    Dyspnea on exertion 02/23/2012   Followed in Pulmonary clinic/ [facility]/ [place]    - 02/23/2012  Walked RA x 3 laps @ 185 ft each stopped due to  End of study, no tachypnea, tachycardia or desat     Elevated blood-pressure reading, without diagnosis of hypertension 01/28/2021   Endometriosis    High cholesterol 10/16/2020   History of basal cell carcinoma (BCC) 01/16/2023   Hyperlipidemia    Hypertension    IBS (irritable bowel syndrome)    Increased frequency of urination 07/24/2014   Intramural uterine fibroid 01/29/2019   Lichen planus 01/29/2021   Loss of hair    Pain, abdominal    Right lower quadrant pain 04/16/2021   Seasonal allergic rhinitis 10/16/2020   Skin cancer, basal cell    Stress 01/28/2021   Vitamin D deficiency     Past Surgical History:  Procedure Laterality Date   CESAREAN SECTION   1983   CHOLECYSTECTOMY  1990   In [place]   CYSTOURETHROSCOPY  2016   HYSTEROSCOPY  2015   LAPAROSCOPY  03/12/2016   with resection of pelvic adhesions   LAPAROTOMY  1990   post operative infection from cholecystectomy   OOPHORECTOMY Left 2000   RIGHT/LEFT HEART CATH AND CORONARY ANGIOGRAPHY N/A 04/23/2021   Procedure: RIGHT/LEFT HEART CATH AND CORONARY ANGIOGRAPHY;  Surgeon: Claudene Victory ORN, MD;  Location: [facility];  Service: Cardiovascular;  Laterality: N/A;   SKIN CANCER EXCISION     facial    Current Medications: Current Meds  Medication Sig   amLODipine  (NORVASC ) 2.5 MG tablet Take 1 tablet (2.5 mg total) by mouth daily as needed. For blood pressure above 150/100   aspirin  EC 81 MG tablet Take 1 tablet (81 mg total) by mouth daily. Swallow whole.   BIOTIN PO Take 1 tablet by mouth daily in the afternoon.   CALCIUM  PO Take 1 tablet by mouth daily in the afternoon.   cetirizine (ZYRTEC) 10 MG tablet Take 10 mg by mouth daily as needed for allergies.   Cholecalciferol (VITAMIN D-3 PO) Take 1 capsule by mouth daily in the afternoon.   clobetasol (TEMOVATE) 0.05 % external solution Apply 1 application  topically 2 (two) times daily as needed (scalp irritation).   Coenzyme Q10 (COQ10 PO) Take 1 capsule by mouth daily in the afternoon.  estradiol  (ESTRACE ) 0.5 MG tablet Take 1 tablet (0.5 mg total) by mouth daily.   EVENING PRIMROSE OIL PO Take 1 capsule by mouth daily in the afternoon.   famotidine (PEPCID) 40 MG tablet Take 40 mg by mouth daily.   finasteride (PROSCAR) 5 MG tablet Take 5 mg by mouth every evening.   hyoscyamine  (LEVBID ) 0.375 MG 12 hr tablet Take 0.375 mg by mouth at bedtime.   ibuprofen  (ADVIL ,MOTRIN ) 200 MG tablet Take 200-400 mg by mouth 2 (two) times daily as needed for mild pain, moderate pain or headache.   metoprolol  succinate (TOPROL -XL) 100 MG 24 hr tablet Take 1 tablet (100 mg total) by mouth daily. Take with or immediately following a meal.    Multiple Vitamin (MULTIVITAMIN WITH MINERALS) TABS tablet Take 1 tablet by mouth daily in the afternoon. Unknown strength   pantoprazole (PROTONIX) 40 MG tablet Take 40 mg by mouth daily.   progesterone  (PROMETRIUM ) 200 MG capsule Take 1 capsule (200 mg total) by mouth daily.   Rimegepant Sulfate (NURTEC) 75 MG TBDP Take 1 tablet (75 mg total) by mouth daily as needed. For migraines. Take as close to onset of migraine as possible. One daily maximum.   rosuvastatin  (CRESTOR ) 5 MG tablet TAKE 1 TABLET BY MOUTH DAILY   VITAMIN E PO Take 1 capsule by mouth daily in the afternoon.   Current Facility-Administered Medications for the 04/21/24 encounter (Office Visit) with Leanna Hamid J, MD  Medication   0.9 %  sodium chloride  infusion     Allergies:   Sanctura [trospium], Zithromax [azithromycin], Ditropan [oxybutynin], Nickel, Other, Sporanox [itraconazole], and Voltaren [diclofenac]   Social History   Socioeconomic History   Marital status: Married    Spouse name: Not on file   Number of children: 1   Years of education: Not on file   Highest education level: Not on file  Occupational History   Not on file  Tobacco Use   Smoking status: Former    Current packs/day: 0.00    Average packs/day: 1 pack/day for 10.0 years (10.0 ttl pk-yrs)    Types: Cigarettes    Start date: 05/19/1994    Quit date: 05/19/2004    Years since quitting: 19.9   Smokeless tobacco: Never  Vaping Use   Vaping status: Never Used  Substance and Sexual Activity   Alcohol use: No   Drug use: No   Sexual activity: Not Currently    Birth control/protection: Post-menopausal  Other Topics Concern   Not on file  Social History Narrative   Caffiene occasional   Working not working   Live husband.     Social Drivers of Corporate Investment Banker Strain: Not on file  Food Insecurity: Low Risk  (02/25/2024)   Received from [facility]   Hunger Vital Sign    Within the past 12 months, you worried that your  food would run out before you got money to buy more: Never true    Within the past 12 months, the food you bought just didn't last and you didn't have money to get more. : Never true  Transportation Needs: No Transportation Needs (02/25/2024)   Received from [facility]    In the past 12 months, has lack of reliable transportation kept you from medical appointments, meetings, work or from getting things needed for daily living? : No  Physical Activity: Not on file  Stress: Not on file  Social Connections: Not on file  Family History: The patient's family history includes Diabetes in her mother; Heart disease in her father; Kidney cancer in her mother; Leukemia in her mother; Liver disease in her father. There is no history of Migraines. ROS:   Please see the history of present illness.    All 14 point review of systems negative except as described per history of present illness  EKGs/Labs/Other Studies Reviewed:         Recent Labs: 07/30/2023: ALT 23; BUN 18; Creatinine, Ser 0.65; Hemoglobin 12.2; Platelets 217; Potassium 3.9; Sodium 141 08/18/2023: TSH 1.810 09/25/2023: Pro B Natriuretic peptide (BNP) 25.0  Recent Lipid Panel    Component Value Date/Time   CHOL 177 05/16/2019 0754   TRIG 133 05/16/2019 0754   HDL 77 05/16/2019 0754   CHOLHDL 2.3 05/16/2019 0754   LDLCALC 77 05/16/2019 0754    Physical Exam:    VS:  BP (!) 146/90 Comment: normal at home readings  Pulse 72   Ht 5' 3 (1.6 m)   Wt 166 lb 4 oz (75.4 kg)   LMP 12/28/2010   SpO2 96%   BMI 29.45 kg/m     Wt Readings from Last 3 Encounters:  04/21/24 166 lb 4 oz (75.4 kg)  01/04/24 173 lb 3.2 oz (78.6 kg)  11/24/23 173 lb 9.6 oz (78.7 kg)     GEN:  Well nourished, well developed in no acute distress HEENT: Normal NECK: No JVD; No carotid bruits LYMPHATICS: No lymphadenopathy CARDIAC: RRR, no murmurs, no rubs, no gallops RESPIRATORY:  Clear to auscultation without rales, wheezing or  rhonchi  ABDOMEN: Soft, non-tender, non-distended MUSCULOSKELETAL:  No edema; No deformity  SKIN: Warm and dry LOWER EXTREMITIES: no swelling NEUROLOGIC:  Alert and oriented x 3 PSYCHIATRIC:  Normal affect   ASSESSMENT:    1. Bicuspid aortic valve   2. Aortic root dilatation   3. Primary hypertension   4. High cholesterol    PLAN:    In order of problems listed above:  Bicuspid arctic valve still doubtful diagnosis Will do echocardiogram. Aortic root dilatation stable. Essential hypertension blood pressure good control at home she brought multiple results of blood pressure measurements from home always good continue present management. Dyslipidemia I will double the dose of Crestor  to 10 mg daily, fasting lipid profile AST LT 6 weeks, we will also check her hemoglobin A1c then.  Follow-up 6 months   Medication Adjustments/Labs and Tests Ordered: Current medicines are reviewed at length with the patient today.  Concerns regarding medicines are outlined above.  No orders of the defined types were placed in this encounter.  Medication changes: No orders of the defined types were placed in this encounter.   Signed, Lamar DOROTHA Fitch, MD, [facility] 04/21/2024 9:37 AM    [facility]

## 2024-05-31 ENCOUNTER — Ambulatory Visit: Admitting: Neurology

## 2024-07-21 ENCOUNTER — Ambulatory Visit: Admitting: Neurology

## 2024-08-19 ENCOUNTER — Ambulatory Visit (HOSPITAL_BASED_OUTPATIENT_CLINIC_OR_DEPARTMENT_OTHER): Payer: Self-pay | Admitting: Obstetrics & Gynecology
